# Patient Record
Sex: Male | Born: 1955 | Race: White | Hispanic: No | Marital: Single | Smoking: Never smoker
Health system: Southern US, Community
[De-identification: ages and names within clinical notes are randomized; demographics above are authoritative.]

## PROBLEM LIST (undated history)

## (undated) DIAGNOSIS — I4819 Other persistent atrial fibrillation: Secondary | ICD-10-CM

## (undated) DIAGNOSIS — N400 Enlarged prostate without lower urinary tract symptoms: Secondary | ICD-10-CM

## (undated) DIAGNOSIS — I483 Typical atrial flutter: Secondary | ICD-10-CM

## (undated) DIAGNOSIS — I493 Ventricular premature depolarization: Secondary | ICD-10-CM

## (undated) DIAGNOSIS — E079 Disorder of thyroid, unspecified: Secondary | ICD-10-CM

## (undated) DIAGNOSIS — M791 Myalgia, unspecified site: Secondary | ICD-10-CM

## (undated) DIAGNOSIS — I480 Paroxysmal atrial fibrillation: Secondary | ICD-10-CM

## (undated) DIAGNOSIS — E785 Hyperlipidemia, unspecified: Secondary | ICD-10-CM

## (undated) DIAGNOSIS — I4510 Unspecified right bundle-branch block: Secondary | ICD-10-CM

## (undated) DIAGNOSIS — Z87898 Personal history of other specified conditions: Secondary | ICD-10-CM

## (undated) HISTORY — DX: Other persistent atrial fibrillation: I48.19

## (undated) HISTORY — DX: Ventricular premature depolarization: I49.3

## (undated) HISTORY — PX: ORCHIECTOMY: SHX2116

## (undated) HISTORY — DX: Myalgia, unspecified site: M79.10

## (undated) HISTORY — DX: Hyperlipidemia, unspecified: E78.5

## (undated) HISTORY — DX: Benign prostatic hyperplasia without lower urinary tract symptoms: N40.0

## (undated) HISTORY — DX: Typical atrial flutter: I48.3

## (undated) HISTORY — DX: Unspecified right bundle-branch block: I45.10

## (undated) HISTORY — PX: TOOTH EXTRACTION: SUR596

## (undated) HISTORY — DX: Paroxysmal atrial fibrillation: I48.0

## (undated) HISTORY — DX: Personal history of other specified conditions: Z87.898

## (undated) HISTORY — PX: BACK SURGERY: SHX140

## (undated) HISTORY — DX: Disorder of thyroid, unspecified: E07.9

---

## 2002-11-14 ENCOUNTER — Encounter: Payer: Self-pay | Admitting: Family Medicine

## 2002-11-14 ENCOUNTER — Encounter: Admission: RE | Admit: 2002-11-14 | Discharge: 2002-11-14 | Payer: Self-pay | Admitting: Family Medicine

## 2006-05-11 ENCOUNTER — Ambulatory Visit: Payer: Self-pay | Admitting: Family Medicine

## 2006-05-16 ENCOUNTER — Encounter: Admission: RE | Admit: 2006-05-16 | Discharge: 2006-05-16 | Payer: Self-pay | Admitting: Family Medicine

## 2007-11-14 ENCOUNTER — Ambulatory Visit: Payer: Self-pay | Admitting: Family Medicine

## 2007-11-27 ENCOUNTER — Ambulatory Visit: Payer: Self-pay | Admitting: Family Medicine

## 2008-02-10 ENCOUNTER — Ambulatory Visit: Payer: Self-pay | Admitting: Family Medicine

## 2008-02-13 ENCOUNTER — Encounter: Payer: Self-pay | Admitting: Pulmonary Disease

## 2008-02-27 ENCOUNTER — Encounter: Payer: Self-pay | Admitting: Pulmonary Disease

## 2008-03-05 ENCOUNTER — Ambulatory Visit: Payer: Self-pay | Admitting: Family Medicine

## 2008-04-13 DIAGNOSIS — F329 Major depressive disorder, single episode, unspecified: Secondary | ICD-10-CM | POA: Insufficient documentation

## 2008-04-13 DIAGNOSIS — K219 Gastro-esophageal reflux disease without esophagitis: Secondary | ICD-10-CM | POA: Insufficient documentation

## 2008-04-13 DIAGNOSIS — R9431 Abnormal electrocardiogram [ECG] [EKG]: Secondary | ICD-10-CM

## 2008-04-13 DIAGNOSIS — I10 Essential (primary) hypertension: Secondary | ICD-10-CM

## 2008-04-13 DIAGNOSIS — R0609 Other forms of dyspnea: Secondary | ICD-10-CM

## 2008-04-13 DIAGNOSIS — E785 Hyperlipidemia, unspecified: Secondary | ICD-10-CM

## 2008-04-14 ENCOUNTER — Ambulatory Visit: Payer: Self-pay | Admitting: Pulmonary Disease

## 2008-04-14 DIAGNOSIS — J309 Allergic rhinitis, unspecified: Secondary | ICD-10-CM | POA: Insufficient documentation

## 2008-04-15 ENCOUNTER — Telehealth (INDEPENDENT_AMBULATORY_CARE_PROVIDER_SITE_OTHER): Payer: Self-pay | Admitting: *Deleted

## 2008-04-17 ENCOUNTER — Telehealth (INDEPENDENT_AMBULATORY_CARE_PROVIDER_SITE_OTHER): Payer: Self-pay | Admitting: *Deleted

## 2008-07-20 ENCOUNTER — Ambulatory Visit: Payer: Self-pay | Admitting: Gastroenterology

## 2008-07-28 ENCOUNTER — Encounter: Payer: Self-pay | Admitting: Gastroenterology

## 2008-07-28 ENCOUNTER — Ambulatory Visit: Payer: Self-pay | Admitting: Gastroenterology

## 2008-08-03 ENCOUNTER — Encounter: Payer: Self-pay | Admitting: Gastroenterology

## 2008-11-12 ENCOUNTER — Ambulatory Visit: Payer: Self-pay | Admitting: Family Medicine

## 2008-12-02 ENCOUNTER — Ambulatory Visit: Payer: Self-pay | Admitting: Family Medicine

## 2008-12-16 ENCOUNTER — Ambulatory Visit: Payer: Self-pay | Admitting: Family Medicine

## 2012-09-05 ENCOUNTER — Encounter: Payer: Self-pay | Admitting: Gastroenterology

## 2014-09-18 ENCOUNTER — Encounter: Payer: Self-pay | Admitting: Gastroenterology

## 2015-01-08 DIAGNOSIS — Z1159 Encounter for screening for other viral diseases: Secondary | ICD-10-CM | POA: Insufficient documentation

## 2015-01-08 DIAGNOSIS — Z1211 Encounter for screening for malignant neoplasm of colon: Secondary | ICD-10-CM | POA: Insufficient documentation

## 2015-01-08 DIAGNOSIS — R040 Epistaxis: Secondary | ICD-10-CM | POA: Insufficient documentation

## 2015-01-08 DIAGNOSIS — Z125 Encounter for screening for malignant neoplasm of prostate: Secondary | ICD-10-CM | POA: Insufficient documentation

## 2015-01-08 DIAGNOSIS — N400 Enlarged prostate without lower urinary tract symptoms: Secondary | ICD-10-CM | POA: Insufficient documentation

## 2015-01-08 DIAGNOSIS — I499 Cardiac arrhythmia, unspecified: Secondary | ICD-10-CM | POA: Insufficient documentation

## 2015-02-02 ENCOUNTER — Encounter: Payer: Self-pay | Admitting: Cardiology

## 2015-03-09 DIAGNOSIS — E782 Mixed hyperlipidemia: Secondary | ICD-10-CM | POA: Insufficient documentation

## 2015-03-09 DIAGNOSIS — M791 Myalgia, unspecified site: Secondary | ICD-10-CM | POA: Insufficient documentation

## 2015-03-09 DIAGNOSIS — E039 Hypothyroidism, unspecified: Secondary | ICD-10-CM | POA: Insufficient documentation

## 2015-03-11 ENCOUNTER — Encounter: Payer: Self-pay | Admitting: *Deleted

## 2015-03-11 ENCOUNTER — Encounter: Payer: Self-pay | Admitting: Cardiology

## 2015-03-11 ENCOUNTER — Ambulatory Visit (INDEPENDENT_AMBULATORY_CARE_PROVIDER_SITE_OTHER): Payer: BC Managed Care – PPO | Admitting: Cardiology

## 2015-03-11 VITALS — BP 170/100 | HR 61 | Ht 70.0 in | Wt 190.2 lb

## 2015-03-11 DIAGNOSIS — I10 Essential (primary) hypertension: Secondary | ICD-10-CM | POA: Diagnosis not present

## 2015-03-11 DIAGNOSIS — I491 Atrial premature depolarization: Secondary | ICD-10-CM | POA: Insufficient documentation

## 2015-03-11 DIAGNOSIS — I452 Bifascicular block: Secondary | ICD-10-CM

## 2015-03-11 DIAGNOSIS — I493 Ventricular premature depolarization: Secondary | ICD-10-CM | POA: Diagnosis not present

## 2015-03-11 DIAGNOSIS — R9431 Abnormal electrocardiogram [ECG] [EKG]: Secondary | ICD-10-CM

## 2015-03-11 NOTE — Patient Instructions (Signed)
Medication Instructions:  No changes  Labwork: none  Testing/Procedures: Your physician has requested that you have an echocardiogram. Echocardiography is a painless test that uses sound waves to create images of your heart. It provides your doctor with information about the size and shape of your heart and how well your heart's chambers and valves are working. This procedure takes approximately one hour. There are no restrictions for this procedure.  Your physician has requested that you have a lexiscan myoview. For further information please visit [email or web address]. Please follow instruction sheet, as given.  Follow-Up: 1 year with Dr Mayford Knifeurner

## 2015-03-11 NOTE — Progress Notes (Signed)
Cardiology Office Note   Date:  03/11/2015   ID:  Donald Morales, DOB 1956/11/16, MRN [id number]  PCP:  Sissy Hoff, MD    Chief Complaint  Patient presents with  . Shortness of Breath  . Irregular Heart Beat  . Abnormal ECG      History of Present Illness: Donald Morales is a 59 y.o. male who presents for evaluation of RBBB and PVC's.  He presented to PCP for routine PE and was noted to have an irregular heart beat and EKG showed PVC's and RBBB.  He denies any history of chest pain but has had some DOE when going up stairs.  This has been going on for a few years but is about the same and has not really changed much.  He was worried that he may have fibromyalgia due to lack of energy and diffuse muscular aches.  He used to work out a lot but has not recently and then got 2 new puppies and feels tired when walking around the block.    He does not get much aerobic exercise.  He has a history of dyslipidemia. He has had a history of palpitations in the past but it was in the setting of increased stress and no workup was pursued.  He denies any dizziness or syncope.  He thinks that occasionally he will have some LE edema that he does not recognize but he says that some of his friends have commented on.      Past Medical History  Diagnosis Date  . Thyroid disease   . Hyperlipidemia   . Irregular heart beat   . History of epistaxis   . BPH (benign prostatic hyperplasia)   . Muscle ache     Past Surgical History  Procedure Laterality Date  . Tooth extraction    . Back surgery    . Orchiectomy       Current Outpatient Prescriptions  Medication Sig Dispense Refill  . ibuprofen (ADVIL,MOTRIN) 200 MG tablet Take 200 mg by mouth every 6 (six) hours as needed.    Marland Kitchen levothyroxine (SYNTHROID, LEVOTHROID) 75 MCG tablet Take 75 mcg by mouth daily.    Marland Kitchen loratadine (CLARITIN) 10 MG tablet Take 10 mg by mouth daily.    Marland Kitchen omeprazole (PRILOSEC) 20 MG capsule 1 capsule     No current  facility-administered medications for this visit.    Allergies:   Aspirin and Codeine    Social History:  The patient  reports that he has never smoked. He does not have any smokeless tobacco history on file. He reports that he drinks alcohol. He reports that he does not use illicit drugs.   Family History:  The patient's family history includes Alcoholism in his father; Heart disease in his mother.    ROS:  Please see the history of present illness.   Otherwise, review of systems are positive for none.   All other systems are reviewed and negative.    PHYSICAL EXAM: VS:  BP 170/100 mmHg  Pulse 61  Ht  (1.778 m)  Wt 190 lb 3.2 oz (86.274 kg)  BMI 27.29 kg/m2 , BMI Body mass index is 27.29 kg/(m^2). GEN: Well nourished, well developed, in no acute distress HEENT: normal Neck: no JVD, carotid bruits, or masses Cardiac: RRR; no murmurs, rubs, or gallops,no edema  Respiratory:  clear to auscultation bilaterally, normal work of breathing GI: soft, nontender, nondistended, + BS MS: no deformity or atrophy Skin: warm and dry,  no rash Neuro:  Strength and sensation are intact Psych: euthymic mood, full affect   EKG:  EKG is ordered today. The ekg ordered today demonstrates NSR with atrial couplet, RBBB, left posterior fascicular block   Recent Labs: No results found for requested labs within last 365 days.    Lipid Panel No results found for: CHOL, TRIG, HDL, CHOLHDL, VLDL, LDLCALC, LDLDIRECT    Wt Readings from Last 3 Encounters:  03/11/15 190 lb 3.2 oz (86.274 kg)  04/14/08 191 lb (86.637 kg)        ASSESSMENT AND PLAN:  1.  Abnormal EKG with RBBB -  Lexiscan myoview to rule out ischemia and 2D echo to assess LVF 2.  PVC's and PAC's - I will  check Holter monitor to assess PVC load 3.  Exertional fatigue    Current medicines are reviewed at length with the patient today.  The patient does not have concerns regarding medicines.  The following changes have  been made:  no change  Labs/ tests ordered today include: see above assessment and plan  Orders Placed This Encounter  Procedures  . Myocardial Perfusion Imaging  . EKG 12-Lead  . 2D Echocardiogram with contrast     Disposition:   FU with me  in 1 year if studies are normal   Harlon FlorSigned, TURNER,TRACI R, MD  03/11/2015 2:50 PM    [facility] [address], [place], [place]  [address] Phone: [phone]; Fax: [phone]

## 2015-03-16 ENCOUNTER — Ambulatory Visit (HOSPITAL_COMMUNITY): Payer: BC Managed Care – PPO | Attending: Cardiology | Admitting: Cardiology

## 2015-03-16 DIAGNOSIS — I452 Bifascicular block: Secondary | ICD-10-CM | POA: Diagnosis not present

## 2015-03-16 DIAGNOSIS — I493 Ventricular premature depolarization: Secondary | ICD-10-CM | POA: Insufficient documentation

## 2015-03-16 DIAGNOSIS — R9431 Abnormal electrocardiogram [ECG] [EKG]: Secondary | ICD-10-CM | POA: Diagnosis not present

## 2015-03-16 DIAGNOSIS — I491 Atrial premature depolarization: Secondary | ICD-10-CM | POA: Diagnosis not present

## 2015-03-16 DIAGNOSIS — I10 Essential (primary) hypertension: Secondary | ICD-10-CM | POA: Insufficient documentation

## 2015-03-16 NOTE — Progress Notes (Signed)
Echo performed. 

## 2015-03-19 ENCOUNTER — Telehealth: Payer: Self-pay | Admitting: Cardiology

## 2015-03-19 NOTE — Telephone Encounter (Signed)
New message ° ° ° ° ° °Want echo results °

## 2015-03-19 NOTE — Telephone Encounter (Signed)
Informed patient of ECHO results and verbal understanding expressed.   

## 2015-03-19 NOTE — Telephone Encounter (Signed)
F/u ° ° °Pt returning your call °

## 2015-03-23 ENCOUNTER — Ambulatory Visit (HOSPITAL_COMMUNITY): Payer: BC Managed Care – PPO | Attending: Cardiology | Admitting: Radiology

## 2015-03-23 DIAGNOSIS — R9431 Abnormal electrocardiogram [ECG] [EKG]: Secondary | ICD-10-CM | POA: Insufficient documentation

## 2015-03-23 DIAGNOSIS — I452 Bifascicular block: Secondary | ICD-10-CM

## 2015-03-23 DIAGNOSIS — R0609 Other forms of dyspnea: Secondary | ICD-10-CM

## 2015-03-23 DIAGNOSIS — I491 Atrial premature depolarization: Secondary | ICD-10-CM

## 2015-03-23 DIAGNOSIS — I4510 Unspecified right bundle-branch block: Secondary | ICD-10-CM | POA: Diagnosis not present

## 2015-03-23 DIAGNOSIS — I493 Ventricular premature depolarization: Secondary | ICD-10-CM

## 2015-03-23 DIAGNOSIS — R5383 Other fatigue: Secondary | ICD-10-CM | POA: Insufficient documentation

## 2015-03-23 DIAGNOSIS — I10 Essential (primary) hypertension: Secondary | ICD-10-CM | POA: Insufficient documentation

## 2015-03-23 MED ORDER — TECHNETIUM TC 99M SESTAMIBI GENERIC - CARDIOLITE
30.0000 | Freq: Once | INTRAVENOUS | Status: AC | PRN
  Administered 2015-03-23: 30 via INTRAVENOUS

## 2015-03-23 MED ORDER — TECHNETIUM TC 99M SESTAMIBI GENERIC - CARDIOLITE
11.0000 | Freq: Once | INTRAVENOUS | Status: AC | PRN
  Administered 2015-03-23: 11 via INTRAVENOUS

## 2015-03-23 MED ORDER — REGADENOSON 0.4 MG/5ML IV SOLN
0.4000 mg | Freq: Once | INTRAVENOUS | Status: AC
  Administered 2015-03-23: 0.4 mg via INTRAVENOUS

## 2015-03-23 NOTE — Progress Notes (Signed)
[facility] SITE [facility] NUCLEAR MED [address] [place], [place] [address] [phone]    Cardiology Nuclear Med Study  Donald BornDonald R Taranto is a 59 y.o. male     MRN : [id number]     DOB: [birth year]  Procedure Date: 03/23/2015  Nuclear Med Background Indication for Stress Test:  Evaluation for Ischemia and Abnormal EKG History:  MPI 2012 (normal) EF 61% Cardiac Risk Factors: Hypertension and RBBB  Symptoms:  DOE and Fatigue  Nuclear Pre-Procedure Caffeine/Decaff Intake:  None> 12 hrs NPO After: 7:00pm   Lungs:  clear O2 Sat: 98% on room air. IV 0.9% NS with Angio Cath:  22g  IV Site: R Antecubital x 1, tolerated well IV Started by:  Irean HongPatsy Edwards, RN  Chest Size (in):  42 Cup Size: n/a  Height: 5\' 10"  (1.778 m)  Weight:  188 lb (85.276 kg)  BMI:  Body mass index is 26.98 kg/(m^2). Tech Comments:  Patient complained of dizziness after Cardiolite was injected. BP 118/80, HR 39 to 48, O2 sat 98%RA. Patient was sweaty and pale. Patient placed in trendelenburg position with resolution of symptoms.BP 120/80, HR 49 O2 sat 98%RA. EKG showed HR 48 to 51.Irean HongPatsy Edwards, RN.    Nuclear Med Study 1 or 2 day study: 1 day  Stress Test Type:  Lexiscan  Reading MD: N/A  Order Authorizing Provider:  Armanda Magicraci Turner, MD  Resting Radionuclide: Technetium 688m Sestamibi  Resting Radionuclide Dose: 11.0 mCi   Stress Radionuclide:  Technetium 508m Sestamibi  Stress Radionuclide Dose: 33.0 mCi           Stress Protocol Rest HR: 55 Stress HR: 75  Rest BP: 143/99 Stress BP: 135/100  Exercise Time (min): n/a METS: n/a   Predicted Max HR: 164 bpm % Max HR: 45.73 bpm Rate Pressure Product: [id number]   Dose of Adenosine (mg):  n/a Dose of Lexiscan: 0.4 mg  Dose of Atropine (mg): n/a Dose of Dobutamine: n/a mcg/kg/min (at max HR)  Stress Test Technologist: D[place]ota Kirlin ChimesSharon Brooks, BS-ES  Nuclear Technologist:  Kerby NoraElzbieta Kubak, CNMT     Rest Procedure:  Myocardial perfusion imaging was performed at rest 45  minutes following the intravenous administration of Technetium 368m Sestamibi. Rest ECG: NSR-RBBB, LPFB  Stress Procedure:  The patient received IV Lexiscan 0.4 mg over 15-seconds.  Technetium 178m Sestamibi injected at 30-seconds.  Quantitative spect images were obtained after a 45 minute delay.  During the infusion of Lexiscan the patient complained of SOB, nausea and hot flash.  These symptoms began to resolve in recovery.  Stress ECG: No significant change from baseline ECG  QPS Raw Data Images:  Normal; no motion artifact; normal heart/lung ratio. Stress Images:  Normal homogeneous uptake in all areas of the myocardium. Rest Images:  Normal homogeneous uptake in all areas of the myocardium. Subtraction (SDS):  No evidence of ischemia. Transient Ischemic Dilatation (Normal <1.22):  1.01 Lung/Heart Ratio (Normal <0.45):  0.28  Quantitative Gated Spect Images QGS EDV:  156 ml QGS ESV:  79 ml  Impression Exercise Capacity:  Lexiscan with no exercise. BP Response:  Normal blood pressure response. Clinical Symptoms:  There is dyspnea. ECG Impression:  No significant ST segment change suggestive of ischemia. Comparison with Prior Nuclear Study: No images to compare  Overall Impression:  Normal stress nuclear study.  LV Ejection Fraction: 49%.  LV Wall Motion:  NL LV Function; NL Wall Motion    Lars MassonELSON, Din Bookwalter H 03/23/2015

## 2016-02-01 IMAGING — NM NM MYOCAR MULTI W/ SPECT
3 series · 18 of 18 positions shown · non-contrast
Comparison: none

[Series 1: rest · 6.51mm/px · 6 of 64 frames shown]
[frame 6/64]
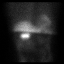
[frame 16/64]
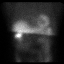
[frame 27/64]
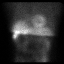
[frame 38/64]
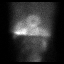
[frame 48/64]
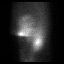
[frame 59/64]
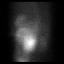

[Series 2: stress · 6.51mm/px · 6 of 64 frames shown (1 of 2)]
[frame 6/64]
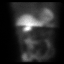
[frame 16/64]
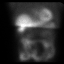
[frame 27/64]
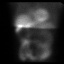
[frame 38/64]
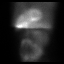
[frame 48/64]
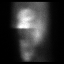
[frame 59/64]
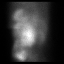

[Series 2: stress · 6.51mm/px · 6 of 512 frames shown (2 of 2)]
[frame 43/512]
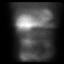
[frame 128/512]
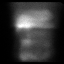
[frame 214/512]
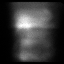
[frame 299/512]
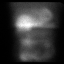
[frame 384/512]
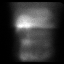
[frame 470/512]
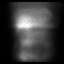

[18 of 18 positions shown; findings below may reference images not displayed]

Canned report from images found in remote index.

Refer to host system for actual result text.

## 2017-02-20 ENCOUNTER — Encounter: Payer: Self-pay | Admitting: Cardiology

## 2017-02-24 ENCOUNTER — Telehealth: Payer: Self-pay | Admitting: Physician Assistant

## 2017-02-24 NOTE — Telephone Encounter (Signed)
Received call to answering service from [employer] for a patient of Dr. York Spanielilley's for a critical notification report.  Per their record, the monitor is for diagnosis of typical atrial flutter. Today they showed first documentation of atrial fibrillation with a rate of 160. He pushed the monitor indicator for symptoms - felt a rapid HR - at home lying down when it happened, woke up from a nap. Follow-up recording showed more of an atrial flutter 130bpm with occasional PVCs per [employer]. I called patient - he just took his HR, currently 89 and feeling great. He states he was recently found to be in atrial flutter at his PCP but was back in NSR with HR at 60 by time of cardiology visit the following day with Dr. Donnie Ahoilley. I do not have access to those records.  He has f/u 03/05/17 with Dr. Donnie Ahoilley. CHADSVASC is only 1 per review of risk factors with patient. He says he was not started on any medication at OV, strategy was to collect info with labwork, CXR and event monitor. Reviewed with Dr. Elberta Fortisamnitz - since patient is feeling fine now, continue event monitoring for now and forward to Dr. Donnie Ahoilley for his review and further management. ER precautions reviewed. Pt grateful for call. Dayna Dunn PA-C

## 2017-02-26 ENCOUNTER — Other Ambulatory Visit: Payer: Self-pay | Admitting: Cardiology

## 2017-02-26 ENCOUNTER — Ambulatory Visit
Admission: RE | Admit: 2017-02-26 | Discharge: 2017-02-26 | Disposition: A | Discharge status: Home / Self Care | Payer: BC Managed Care – PPO | Source: Ambulatory Visit | Attending: Cardiology | Admitting: Cardiology

## 2017-02-26 DIAGNOSIS — I483 Typical atrial flutter: Secondary | ICD-10-CM

## 2017-03-09 ENCOUNTER — Other Ambulatory Visit: Payer: Self-pay | Admitting: Nurse Practitioner

## 2017-03-09 ENCOUNTER — Encounter: Payer: Self-pay | Admitting: Cardiology

## 2017-03-09 NOTE — Consult Note (Signed)
 Donald Morales, Donald Morales  Date of visit:  02/20/2017 DOB:  03/20/1956    Age:  61 yrs. Medical record number:  [id number]     Account number:  [id number] Primary Care Provider: SWAYNE, DAVID ____________________________ CURRENT DIAGNOSES  1. Atrial flutter, typical  2. Elevated blood-pressure reading, without diagnosis of hypertension  3. Hypothyroidism  4. Hyperlipidemia ____________________________ ALLERGIES  Aspirin, Epistaxis  Codeine, Hallucinations ____________________________ MEDICATIONS  1. omeprazole 20 mg capsule,delayed release, 1 p.o. daily  2. levothyroxine 75 mcg tablet, 1 p.o. daily  3. ibuprofen 200 mg tablet, PRN ____________________________ HISTORY OF PRESENT ILLNESS This 61-year-old male is seen at the request of Dr. Swayne for evaluation of atrial arrhythmia. The patient previously has been in good health except for elevation of blood pressure and mild hyperlipidemia. About 2 years ago he was seen by Dr. Tracy Turner for evaluation of PVCs and some mild dyspnea. At that time had a negative Lexiscan Cardiolite with an EF of 49% and no ischemia. An echocardiogram was also normal. He was feeling well and saw Dr. Swayne yesterday for routine visit was noted to be severely tachycardic. An EKG showed SVT with a rate of 163 but to me looks like atrial flutter with 2:1 response. The patient was asymptomatic at the time however. He was unaware that he had a rapid heart rate and Dr. Swayne's office arranged for him to be seen here today. The patient really doesn't have any complaints today and states that he feels well. He has not been exercising much. He denies angina and has no PND, orthopnea, syncope, or claudication. He is unaware that he has a regular heart beat than yesterday during his tachycardia felt fine. He has not had lab work done recently. He states that his blood pressure has been rising gradually over the past several years and has been elevated in the office today as well as a  Dr. Swayne's office. He states he checks his blood pressure at home occasionally. He states that friends have told him that his ankles will swell occasionally.  His only other complaint is of discomfort involving his feet and the back of his legs when he first gets up and starts to walk does not normally tender at rest. ____________________________ PAST HISTORY  Past Medical Illnesses:  hypothyroidism, hyperlipidemia, shingles, BPH, hypertension;  Cardiovascular Illnesses:  arrhythmia-PACs, arrhythmia-PVCs, conduction disorder-RBBB;  Infectious Diseases:  no previous history of significant infectious diseases;  Surgical Procedures:  lumbar laminectomy, left orchiectomy, foot surgery;  Trauma History:  no previous history of significant trauma;  NYHA Classification:  I;  Cardiology Procedures-Invasive:  no previous interventional or invasive cardiology procedures;  Cardiology Procedures-Noninvasive:  echocardiogram, treadmill cardiolite;  Peripheral Vascular Procedures:  no previous invasive peripheral vascular procedures.;  LVEF of 49% documented via nuclear study on 03/23/2015,   ____________________________ CARDIO-PULMONARY TEST DATES EKG Date:  02/20/2017;  Nuclear Study Date:  03/23/2015;  Echocardiography Date: 03/16/2015;   ____________________________ FAMILY HISTORY Brother -- Brother alive and well Brother -- Brother alive and well Father -- Father dead, Alcoholism Mother -- Mother dead, Heart disease, Cancer Sister -- Sister alive and well Sister -- Sister alive and well Sister -- Sister alive and well ____________________________ SOCIAL HISTORY Alcohol Use:  occasionally;  Smoking:  never smoked;  Diet:  regular diet;  Lifestyle:  single and no children;  Exercise:  no regular exercise;  Occupation:  custodian/maintenance;  Job Description:  [employer];   ____________________________ REVIEW OF SYSTEMS General:  denies recent weight change,   fatique or change in exercise  tolerance.  Integumentary:dry skin Eyes: wears eye glasses/contact lenses, denies diplopia, glaucoma or visual field defects. Ears, Nose, Throat, Mouth:  occasional epistaxis Respiratory: denies dyspnea, cough, wheezing or hemoptysis. Cardiovascular:  please review HPI Abdominal: denies dyspepsia, GI bleeding, constipation, or diarrhea Genitourinary-Male: no dysuria, urgency, frequency, or nocturia  Musculoskeletal:  leg cramps Neurological:  denies headaches, stroke, or TIA Psychiatric:  anxiety Hematological/Immunologic:  denies any food allergies, bleeding disorders. ____________________________ PHYSICAL EXAMINATION VITAL SIGNS  Blood Pressure:  154/90 Sitting, Right arm, regular cuff  , 150/100 Standing, Right arm and regular cuff   Pulse:  60/min. Weight:  189.00 lbs. Height:  68"BMI: 29  Constitutional:  pleasant white male in no acute distress, mildly obese Skin:  warm and dry to touch, no apparent skin lesions, or masses noted. Head:  normocephalic, normal hair pattern, no masses or tenderness Eyes:  EOMS Intact, PERRLA, C and S clear, Funduscopic exam not done. ENT:  ears, nose and throat reveal no gross abnormalities.  Dentition good. Neck:  supple, without massess. No JVD, thyromegaly or carotid bruits. Carotid upstroke normal. Chest:  normal symmetry, clear to auscultation. Cardiac:  regular rhythm, normal S1 and S2, No S3 or S4, no murmurs, gallops or rubs detected. Abdomen:  abdomen soft,non-tender, no masses, no hepatospenomegaly, or aneurysm noted Peripheral Pulses:  the femoral,dorsalis pedis, and posterior tibial pulses are full and equal bilaterally with no bruits auscultated. Extremities & Back:  no deformities, clubbing, cyanosis, erythema or edema observed. Normal muscle strength and tone. Neurological:  no gross motor or sensory deficits noted, affect appropriate, oriented x3. ____________________________ MOST RECENT LIPID PANEL 02/17/16  CHOL TOTL 147 mg/dl, LDL 86 NM,  HDL 44 mg/dl, TRIGLYCER 84 mg/dl, ALT 22 u/l, ALK PHOS 101 u/l, CHOL/HDL 3.3 (Calc) and AST 27 u/l ____________________________ IMPRESSIONS/PLAN  1. Asymptomatic atrial flutter which has resolved and is in normal sinus rhythm today with PVCs and PACs 2. 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 Elevation of blood pressure without prior diagnosis of hypertension 4. Hyperlipidemia 5. Hypothyroidism  Recommendations:  I have recommended that he wear a cardiac event monitor to determine how much in the way of atrial arrhythmias he is having. If he is having silent atrial flutter he might be a candidate for atrial flutter ablation. I also would recommend another echocardiogram to assess for structural heart disease particularly in light of his tachycardia. He should have lab work and have recommended this be done at Dr. Swayne's office soon. I would like him to have a chest x-ray. ____________________________ TODAYS ORDERS  1. [place]: Today  2. Return Visit: 1 month  3. 2D, color flow, doppler: At [facility]  4. Chest X-ray PA/Lat: today  5. 12 Lead EKG: Today                       ____________________________ Cardiology Physician:  W. Spencer Tilley, Jr. MD [facility]      

## 2017-03-09 NOTE — Progress Notes (Signed)
Donald Morales  Date of visit:  03/09/2017 DOB:  Apr 26, 1956    Age:  61 yrs. Medical record number:  [id number]     Account number:  [id number] Primary Care Provider: Moreen Fowler, DAVID ____________________________ CURRENT DIAGNOSES  1. Atrial flutter, typical  2. Elevated blood-pressure reading, without diagnosis of hypertension  3. Hypothyroidism  4. Hyperlipidemia  5. PVC's ____________________________ ALLERGIES  Aspirin, Epistaxis  Codeine, Hallucinations ____________________________ MEDICATIONS  1. ibuprofen 200 mg tablet, PRN  2. levothyroxine 75 mcg tablet, 1 p.o. daily  3. metoprolol succinate ER 50 mg tablet,extended release 24 hr, 1 p.o. daily  4. omeprazole 20 mg capsule,delayed release, 1 p.o. daily ____________________________ HISTORY OF PRESENT ILLNESS Patient returns for cardiac followup. His echocardiogram shows mild concentric LVH with a normal left atrial size. and he has some very mild hypertension. He has been taking beta blockers and has been symptomatic with palpitations usually corresponded to PVCs. His event monitor has shown atrial flutter and fibrillation on March 24, and March 26 and March 27 sometimes with rapid response. His initial presentation was atrial flutter with rapid response only physical. He also has known bifascicular block. His CHA2DS2VASC score is one. ____________________________ PAST HISTORY  Past Medical Illnesses:  hypothyroidism, hyperlipidemia, shingles, BPH, hypertension;  Cardiovascular Illnesses:  arrhythmia-PACs, arrhythmia-PVCs, conduction disorder-RBBB;  Infectious Diseases:  no previous history of significant infectious diseases;  Surgical Procedures:  lumbar laminectomy, left orchiectomy, foot surgery;  Trauma History:  no previous history of significant trauma;  NYHA Classification:  I;  Cardiology Procedures-Invasive:  no previous interventional or invasive cardiology procedures;  Cardiology Procedures-Noninvasive:  echocardiogram,  treadmill cardiolite, echocardiogram April 2018;  Peripheral Vascular Procedures:  no previous invasive peripheral vascular procedures.;  LVEF of 49% documented via nuclear study on 03/23/2015,   ____________________________ CARDIO-PULMONARY TEST DATES EKG Date:  02/20/2017;  Nuclear Study Date:  03/23/2015;  Echocardiography Date: 03/09/2017;   ____________________________ SOCIAL HISTORY Alcohol Use:  occasionally;  Smoking:  never smoked;  Diet:  regular diet;  Lifestyle:  single and no children;  Exercise:  no regular exercise;  Occupation:  Art therapist;  Job Description:  [employer];   ____________________________ REVIEW OF SYSTEMS General:  denies recent weight change, fatique or change in exercise tolerance. Respiratory: denies dyspnea, cough, wheezing or hemoptysis. Cardiovascular:  please review HPI  Genitourinary-Male: no dysuria, urgency, frequency, or nocturia  Musculoskeletal:  leg cramps Neurological:  denies headaches, stroke, or TIA Psychiatric:  anxiety  ____________________________ PHYSICAL EXAMINATION VITAL SIGNS  Blood Pressure:  160/100 Sitting, Right arm, regular cuff  , 150/90 Standing, Right arm and regular cuffBMI: 0  Constitutional:  pleasant white male in no acute distress, mildly obese Skin:  warm and dry to touch, no apparent skin lesions, or masses noted. Head:  normocephalic, normal hair pattern, no masses or tenderness Chest:  normal symmetry, clear to auscultation. Cardiac:  regular rhythm, normal S1 and S2, No S3 or S4, no murmurs, gallops or rubs detected. Peripheral Pulses:  the femoral,dorsalis pedis, and posterior tibial pulses are full and equal bilaterally with no bruits auscultated. Extremities & Back:  no deformities, clubbing, cyanosis, erythema or edema observed. Normal muscle strength and tone. Neurological:  no gross motor or sensory deficits noted, affect appropriate, oriented x3. ____________________________ MOST RECENT  LIPID PANEL 03/05/17  CHOL TOTL 155 mg/dl, LDL 89 NM, HDL 49 mg/dl, TRIGLYCER 85 mg/dl, ALT 24 u/l, ALK PHOS 104 u/l, CHOL/HDL 3.2 (Calc) and AST 27 u/l ____________________________ IMPRESSIONS/PLAN  1. Recurrent paroxysmal atrial flutter and atrial  fibrillation 2. Bifascicular block 3. Essential hypertension 4. PVCs  Recommendations:  The patient has paroxysmal atrial flutter as well as fibrillation. His initial presentation was asymptomatic at the time of a physical. He has had continued episodes. We talked about stroke risk as well as anticoagulation. With his CHA2DS2VASC score of one we discussed options of no treatment versus systemic anticoagulation. At the present time he did not opt for systemic anticoagulation for further stroke risk reduction.  I have recommended that he have a consultation with electrophysiologist at his age since he continues to have paroxysmal atrial fibrillation and atrial flutter to talk about possible consideration for ablation. He also has premature ventricular contractions. I will see him following the consultation with the electrophysiologist.  ____________________________ Cardiology Physician:  Kerry Hough MD [facility]

## 2017-03-27 ENCOUNTER — Encounter: Payer: Self-pay | Admitting: Internal Medicine

## 2017-03-27 ENCOUNTER — Ambulatory Visit (INDEPENDENT_AMBULATORY_CARE_PROVIDER_SITE_OTHER): Payer: BC Managed Care – PPO | Admitting: Internal Medicine

## 2017-03-27 VITALS — BP 130/82 | HR 78 | Ht 70.0 in | Wt 192.2 lb

## 2017-03-27 DIAGNOSIS — I480 Paroxysmal atrial fibrillation: Secondary | ICD-10-CM | POA: Diagnosis not present

## 2017-03-27 DIAGNOSIS — I493 Ventricular premature depolarization: Secondary | ICD-10-CM

## 2017-03-27 DIAGNOSIS — I483 Typical atrial flutter: Secondary | ICD-10-CM

## 2017-03-27 NOTE — Patient Instructions (Signed)
Medication Instructions:  Your physician recommends that you continue on your current medications as directed. Please refer to the Current Medication list given to you today.   Labwork: None ordered   Testing/Procedures: None ordered   Follow-Up: Your physician recommends that you schedule a follow-up appointment in: 2 months with Dr Allred   Any Other Special Instructions Will Be Listed Below (If Applicable).     If you need a refill on your cardiac medications before your next appointment, please call your pharmacy.   

## 2017-03-27 NOTE — Progress Notes (Signed)
Electrophysiology Office Note   Date:  03/27/2017   ID:  Donald Morales, DOB 20-Mar-1956, MRN [id number]  PCP:  Sissy Hoff, MD  Cardiologist:  Dr Donnie Aho Primary Electrophysiologist: Hillis Range, MD    CC:  Atrial fibrillation   History of Present Illness: Donald Morales is a 61 y.o. male who presents today for electrophysiology evaluation.   The patient is referred by Dr Donnie Aho for EP consultation regarding atrial fibrillation and atrial flutter.  The patient recently presented to Dr Merita Norton office for routine exam and was noted to have typical atrial flutter.  He was referred to Dr Donnie Aho and had event monitor placed which documented afib as well. He was also noted to have frequent ventricular ectopy.  He has been asymptomatic.  He was started on toprol for rate control.  He has done well since that time.  Today, he denies symptoms of palpitations, chest pain, shortness of breath, orthopnea, PND, lower extremity edema, claudication, dizziness, presyncope, syncope, bleeding, or neurologic sequela. The patient is tolerating medications without difficulties and is otherwise without complaint today.    Past Medical History:  Diagnosis Date  . BPH (benign prostatic hyperplasia)   . History of epistaxis   . Hyperlipidemia   . Muscle ache   . Paroxysmal atrial fibrillation (HCC)   . Premature ventricular contraction   . RBBB   . Thyroid disease   . Typical atrial flutter [facility])    Past Surgical History:  Procedure Laterality Date  . BACK SURGERY    . ORCHIECTOMY    . TOOTH EXTRACTION       Current Outpatient Prescriptions  Medication Sig Dispense Refill  . ibuprofen (ADVIL,MOTRIN) 200 MG tablet Take 200 mg by mouth every 6 (six) hours as needed (pain).     Marland Kitchen levothyroxine (SYNTHROID, LEVOTHROID) 75 MCG tablet Take 75 mcg by mouth daily.    Marland Kitchen loratadine (CLARITIN) 10 MG tablet Take 10 mg by mouth daily as needed for allergies.     . metoprolol succinate (TOPROL-XL) 50 MG  24 hr tablet Take 50 mg by mouth daily.    Marland Kitchen omeprazole (PRILOSEC) 20 MG capsule Take 20 mg by mouth daily.      No current facility-administered medications for this visit.     Allergies:   Aspirin and Codeine   Social History:  The patient  reports that he has never smoked. He has never used smokeless tobacco. He reports that he drinks alcohol. He reports that he does not use drugs.   Family History:  The patient's  family history includes Alcoholism in his father; Heart disease in his mother.    ROS:  Please see the history of present illness.   All other systems are personally reviewed and negative.    PHYSICAL EXAM: VS:  BP 130/82   Pulse 78   Ht  (1.778 m)   Wt 192 lb 3.2 oz (87.2 kg)   SpO2 97%   BMI 27.58 kg/m  , BMI Body mass index is 27.58 kg/m. GEN: Well nourished, well developed, in no acute distress  HEENT: normal  Neck: no JVD, carotid bruits, or masses Cardiac: iRRR; no murmurs, rubs, or gallops,no edema  Respiratory:  clear to auscultation bilaterally, normal work of breathing GI: soft, nontender, nondistended, + BS MS: no deformity or atrophy  Skin: warm and dry  Neuro:  Strength and sensation are intact Psych: euthymic mood, full affect  EKG:  EKG is ordered today. The ekg ordered today is  personally reviewed and shows afib, V rate 106 bpm, RBBB,   Recent Labs: No results found for requested labs within last 8760 hours.  personally reviewed   Lipid Panel  No results found for: CHOL, TRIG, HDL, CHOLHDL, VLDL, LDLCALC, LDLDIRECT personally reviewed   Wt Readings from Last 3 Encounters:  03/27/17 192 lb 3.2 oz (87.2 kg)  03/23/15 188 lb (85.3 kg)  03/11/15 190 lb 3.2 oz (86.3 kg)      Other studies personally reviewed: Additional studies/ records that were reviewed today include: EKG from Dr Merita Norton office which confirms typical atrial flutter,  Echo from Dr Tawana Scale office which reveals preserved EF, no valvular disease, mild LA  enlargement, event monitor which shows afib as well as sinus with ventricular ectopy  Review of the above records today demonstrates: as above   ASSESSMENT AND PLAN:  1.  Typical atrial flutter/ paroxysmal atrial fibrillation chads2vasc score is 1.  We discussed pros and cons of anticoagulation.  As per guidelines, he would prefer to not start anticoagulation at this time.  Therapeutic strategies for afib including rate control and rhythm control were discussed in detail with the patient today. He is aware that at this point, ablation is supported only for symptomatic relief.  He does not feel that his symptoms warrant the risks of the procedure.  CABANA trial results are expected to be presented in May which may show benefit outside of symptomatic relief.  Current data available would not support ablation for him. He is clear that he is not interested in AAD therapy at this time. No changes are therefore made today.  2. PVCs Normal EF Continue beta blocker  3. Elevated TSH Followed by Dr Azucena Cecil  Return to see me in 6 weeks to reassess for symptoms related to afib Follow-up with Dr Donnie Aho as scheduled  Current medicines are reviewed at length with the patient today.   The patient does not have concerns regarding his medicines.  The following changes were made today:  none    Signed, Hillis Range, MD  03/27/2017 4:28 PM     [facility] [address] [place] [address] [phone] (office) [phone] (fax)

## 2017-04-09 ENCOUNTER — Telehealth: Payer: Self-pay | Admitting: Internal Medicine

## 2017-04-09 NOTE — Telephone Encounter (Signed)
New message     1. What dental office are you calling from? [facility]   2. What is your office phone and fax number? [phone] fax [phone]  3. What type of procedure is the patient having performed? Cleaning and procedure   4. What date is procedure scheduled? 04/12/17   5. What is your question (ex. Antibiotics prior to procedure, holding medication-we need to know how long dentist wants pt to hold med)? Does pt need premed and , does he need to hold any medication ?

## 2017-04-09 NOTE — Telephone Encounter (Signed)
Returned call to Delaney Meigsamara at [facility]-referred to Dr. Donnie Ahoilley for clearance.  Verbalized understanding.

## 2017-06-04 ENCOUNTER — Ambulatory Visit: Payer: BC Managed Care – PPO | Admitting: Internal Medicine

## 2018-10-02 ENCOUNTER — Telehealth: Payer: Self-pay | Admitting: Cardiology

## 2018-10-07 NOTE — Telephone Encounter (Signed)
done

## 2018-10-30 ENCOUNTER — Encounter: Payer: Self-pay | Admitting: Cardiology

## 2018-10-30 ENCOUNTER — Ambulatory Visit: Payer: BC Managed Care – PPO | Admitting: Cardiology

## 2018-10-30 VITALS — BP 132/80 | HR 71 | Ht 70.0 in | Wt 190.8 lb

## 2018-10-30 DIAGNOSIS — I493 Ventricular premature depolarization: Secondary | ICD-10-CM

## 2018-10-30 DIAGNOSIS — R0609 Other forms of dyspnea: Secondary | ICD-10-CM | POA: Diagnosis not present

## 2018-10-30 DIAGNOSIS — E785 Hyperlipidemia, unspecified: Secondary | ICD-10-CM

## 2018-10-30 DIAGNOSIS — I4819 Other persistent atrial fibrillation: Secondary | ICD-10-CM | POA: Diagnosis not present

## 2018-10-30 DIAGNOSIS — I452 Bifascicular block: Secondary | ICD-10-CM

## 2018-10-30 DIAGNOSIS — I480 Paroxysmal atrial fibrillation: Secondary | ICD-10-CM

## 2018-10-30 MED ORDER — METOPROLOL SUCCINATE ER 25 MG PO TB24: 75.0000 mg | ORAL_TABLET | Freq: Every day | ORAL | 1 refills | Status: DC

## 2018-10-30 NOTE — Progress Notes (Signed)
Cardiology Office Note:    Date:  10/30/2018   ID:  Donald Morales, DOB Oct 31, 1956, MRN [id number]  PCP:  Tally Joe, MD  Cardiologist:  Gypsy Balsam, MD    Referring MD: Tally Joe, MD   Chief Complaint  Patient presents with  . Follow-up  I would like to be checked out  History of Present Illness:    Donald Morales is a 62 y.o. male with history of paroxysmal atrial fibrillation as well as one episode of atrial flutter initial diagnosis was established more than year ago after that he was referred to EP team for consideration of antiarrhythmic therapy or ablation.  Decision has been made that no ablation was indicated at that time he also did not want to use any activity medication and then he did not see anybody for almost a year now he would like to be seen because there are multiple coworkers at his job in the pounding heart attack and he is concerned about she reports to have shortness of breath with exertion this is something new that gradually developed over the 6 months time when he walks upstairs he had to slow down and stop because of shortness of breath.  There is no chest pain tightness squeezing pressure burning chest.  There is no dizziness no syncope no palpitations.  Past Medical History:  Diagnosis Date  . BPH (benign prostatic hyperplasia)   . History of epistaxis   . Hyperlipidemia   . Muscle ache   . Paroxysmal atrial fibrillation (HCC)   . Premature ventricular contraction   . RBBB   . Thyroid disease   . Typical atrial flutter [facility])     Past Surgical History:  Procedure Laterality Date  . BACK SURGERY    . ORCHIECTOMY    . TOOTH EXTRACTION      Current Medications: Current Meds  Medication Sig  . amLODipine (NORVASC) 2.5 MG tablet Take 2.5 mg by mouth daily.  Marland Kitchen ibuprofen (ADVIL,MOTRIN) 200 MG tablet Take 200 mg by mouth every 6 (six) hours as needed (pain).   Marland Kitchen levothyroxine (SYNTHROID, LEVOTHROID) 100 MCG tablet Take 100 mcg by mouth  daily.   . metoprolol succinate (TOPROL-XL) 25 MG 24 hr tablet Take 3 tablets (75 mg total) by mouth daily.  Marland Kitchen omeprazole (PRILOSEC) 20 MG capsule Take 20 mg by mouth daily.   . [DISCONTINUED] metoprolol succinate (TOPROL-XL) 50 MG 24 hr tablet Take 75 mg by mouth daily.     Allergies:   Aspirin and Codeine   Social History   Socioeconomic History  . Marital status: Single    Spouse name: Not on file  . Number of children: 0  . Years of education: college  . Highest education level: Not on file  Occupational History  . Occupation: [employer]  Social Needs  . Financial resource strain: Not on file  . Food insecurity:    Worry: Not on file    Inability: Not on file  . Transportation needs:    Medical: Not on file    Non-medical: Not on file  Tobacco Use  . Smoking status: Never Smoker  . Smokeless tobacco: Never Used  Substance and Sexual Activity  . Alcohol use: Yes    Alcohol/week: 0.0 standard drinks    Comment: rare  . Drug use: No  . Sexual activity: Not on file  Lifestyle  . Physical activity:    Days per week: Not on file    Minutes per session: Not  on file  . Stress: Not on file  Relationships  . Social connections:    Talks on phone: Not on file    Gets together: Not on file    Attends religious service: Not on file    Active member of club or organization: Not on file    Attends meetings of clubs or organizations: Not on file    Relationship status: Not on file  Other Topics Concern  . Not on file  Social History Narrative   [place] alone in [place]   Works in custodial department at [employer]     Family History: The patient's family history includes Alcoholism in his father; Heart disease in his mother. ROS:   Please see the history of present illness.    All 14 point review of systems negative except as described per history of present illness  EKGs/Labs/Other Studies Reviewed:      Recent Labs: No results found for  requested labs within last 8760 hours.  Recent Lipid Panel No results found for: CHOL, TRIG, HDL, CHOLHDL, VLDL, LDLCALC, LDLDIRECT  Physical Exam:    VS:  BP 132/80   Pulse 71   Ht 5\' 10"  (1.778 m)   Wt 190 lb 12.8 oz (86.5 kg)   SpO2 98%   BMI 27.38 kg/m     Wt Readings from Last 3 Encounters:  10/30/18 190 lb 12.8 oz (86.5 kg)  03/27/17 192 lb 3.2 oz (87.2 kg)  03/23/15 188 lb (85.3 kg)     GEN:  Well nourished, well developed in no acute distress HEENT: Normal NECK: No JVD; No carotid bruits LYMPHATICS: No lymphadenopathy CARDIAC: Irreg irreg, tachy, no murmurs, no rubs, no gallops RESPIRATORY:  Clear to auscultation without rales, wheezing or rhonchi  ABDOMEN: Soft, non-tender, non-distended MUSCULOSKELETAL:  No edema; No deformity  SKIN: Warm and dry LOWER EXTREMITIES: no swelling NEUROLOGIC:  Alert and oriented x 3 PSYCHIATRIC:  Normal affect   ASSESSMENT:    1. 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 Paroxysmal atrial fibrillation (HCC)   3. Persistent atrial fibrillation   4. Dyspnea on exertion   5. Dyslipidemia   6. PVC's (premature ventricular contractions)    PLAN:    In order of problems listed above:  1. Paroxysmal atrial fibrillation most likely persistent but now his EKG today showed atrial fibrillation right bundle branch block with fast ventricular rate I suspect his symptomatology is related to atrial fibrillation especially considering the fact that is poorly controlled we had a long discussion about what to doing the situation.  I will increase the dose of Toprol-XL from 50-75.  His chads 2 Vascor is only 1 and he prefers not to be anticoagulated.  I told him if he will consider cardioversion we need to anticoagulate him before it.  However I think the key will be to get echocardiogram to check left ventricular ejection fraction and look at the left atrial size by doing this we have some idea about how much atrial  fibrillation he truly gets on top of that because of his symptomatology I think we should revisit the issue of atrial fibrillation ablation again.  I brought concerned about using antiarrhythmics he said he will think it over.  As a part of evaluation we will also do stress test that will also help us make a decision regarding antiarrhythmic therapy. 2. Dyspnea on exertion could be related to atrial fibrillation which is poorly controlled we will get  echocardiogram to make sure he does not have tachycardia related cardiomyopathy stress test also will be done to rule out ischemia as potential cause of his symptoms 3. Dyslipidemia we will check fasting lipid profile. 4. PVCs now he is in atrial fibrillation.    Medication Adjustments/Labs and Tests Ordered: Current medicines are reviewed at length with the patient today.  Concerns regarding medicines are outlined above.  Orders Placed This Encounter  Procedures  . MYOCARDIAL PERFUSION IMAGING  . EKG 12-Lead  . ECHOCARDIOGRAM COMPLETE   Medication changes:  Meds ordered this encounter  Medications  . metoprolol succinate (TOPROL-XL) 25 MG 24 hr tablet    Sig: Take 3 tablets (75 mg total) by mouth daily.    Dispense:  180 tablet    Refill:  1    Signed, Georgeanna Lea, MD, [facility] 10/30/2018 12:19 PM    [facility]

## 2018-10-30 NOTE — Patient Instructions (Signed)
Medication Instructions:  Your physician has recommended you make the following change in your medication:   INCREASE: Metopolol succinate to 75 mg daily.  If you need a refill on your cardiac medications before your next appointment, please call your pharmacy.   Lab work: None.  If you have labs (blood work) drawn today and your tests are completely normal, you will receive your results only by: Marland Kitchen. MyChart Message (if you have MyChart) OR . A paper copy in the mail If you have any lab test that is abnormal or we need to change your treatment, we will call you to review the results.  Testing/Procedures: Your physician has requested that you have an echocardiogram. Echocardiography is a painless test that uses sound waves to create images of your heart. It provides your doctor with information about the size and shape of your heart and how well your heart's chambers and valves are working. This procedure takes approximately one hour. There are no restrictions for this procedure.  Your physician has requested that you have en exercise stress myoview. For further information please visit [email or web address]. Please follow instruction sheet, as given.    Follow-Up: At [facility], you and your health needs are our priority.  As part of our continuing mission to provide you with exceptional heart care, we have created designated Provider Care Teams.  These Care Teams include your primary Cardiologist (physician) and Advanced Practice Providers (APPs -  Physician Assistants and Nurse Practitioners) who all work together to provide you with the care you need, when you need it. You will need a follow up appointment in 3 weeks.  Please call our office 2 months in advance to schedule this appointment.  You may see No primary care provider on file. or another member of our [facility] Provider Team in [place]: Norman HerrlichBrian Munley, MD . Belva Cromeajan Revankar, MD  Any Other Special Instructions Will Be  Listed Below (If Applicable).  Echocardiogram An echocardiogram, or echocardiography, uses sound waves (ultrasound) to produce an image of your heart. The echocardiogram is simple, painless, obtained within a short period of time, and offers valuable information to your health care provider. The images from an echocardiogram can provide information such as:  Evidence of coronary artery disease (CAD).  Heart size.  Heart muscle function.  Heart valve function.  Aneurysm detection.  Evidence of a past heart attack.  Fluid buildup around the heart.  Heart muscle thickening.  Assess heart valve function.  Tell a health care provider about:  Any allergies you have.  All medicines you are taking, including vitamins, herbs, eye drops, creams, and over-the-counter medicines.  Any problems you or family members have had with anesthetic medicines.  Any blood disorders you have.  Any surgeries you have had.  Any medical conditions you have.  Whether you are pregnant or may be pregnant. What happens before the procedure? No special preparation is needed. Eat and drink normally. What happens during the procedure?  In order to produce an image of your heart, gel will be applied to your chest and a wand-like tool (transducer) will be moved over your chest. The gel will help transmit the sound waves from the transducer. The sound waves will harmlessly bounce off your heart to allow the heart images to be captured in real-time motion. These images will then be recorded.  You may need an IV to receive a medicine that improves the quality of the pictures. What happens after the procedure? You may return to your  normal schedule including diet, activities, and medicines, unless your health care provider tells you otherwise. This information is not intended to replace advice given to you by your health care provider. Make sure you discuss any questions you have with your health care  provider. Document Released: 11/17/2000 Document Revised: 07/08/2016 Document Reviewed: 07/28/2013 Elsevier Interactive Patient Education  2017 Elsevier Inc.  Cardiac Nuclear Scan A cardiac nuclear scan is a test that measures blood flow to the heart when a person is resting and when he or she is exercising. The test looks for problems such as:  Not enough blood reaching a portion of the heart.  The heart muscle not working normally.  You may need this test if:  You have heart disease.  You have had abnormal lab results.  You have had heart surgery or angioplasty.  You have chest pain.  You have shortness of breath.  In this test, a radioactive dye (tracer) is injected into your bloodstream. After the tracer has traveled to your heart, an imaging device is used to measure how much of the tracer is absorbed by or distributed to various areas of your heart. This procedure is usually done at a hospital and takes 2-4 hours. Tell a health care provider about:  Any allergies you have.  All medicines you are taking, including vitamins, herbs, eye drops, creams, and over-the-counter medicines.  Any problems you or family members have had with the use of anesthetic medicines.  Any blood disorders you have.  Any surgeries you have had.  Any medical conditions you have.  Whether you are pregnant or may be pregnant. What are the risks? Generally, this is a safe procedure. However, problems may occur, including:  Serious chest pain and heart attack. This is only a risk if the stress portion of the test is done.  Rapid heartbeat.  Sensation of warmth in your chest. This usually passes quickly.  What happens before the procedure?  Ask your health care provider about changing or stopping your regular medicines. This is especially important if you are taking diabetes medicines or blood thinners.  Remove your jewelry on the day of the procedure. What happens during the  procedure?  An IV tube will be inserted into one of your veins.  Your health care provider will inject a small amount of radioactive tracer through the tube.  You will wait for 20-40 minutes while the tracer travels through your bloodstream.  Your heart activity will be monitored with an electrocardiogram (ECG).  You will lie down on an exam table.  Images of your heart will be taken for about 15-20 minutes.  You may be asked to exercise on a treadmill or stationary bike. While you exercise, your heart's activity will be monitored with an ECG, and your blood pressure will be checked. If you are unable to exercise, you may be given a medicine to increase blood flow to parts of your heart.  When blood flow to your heart has peaked, a tracer will again be injected through the IV tube.  After 20-40 minutes, you will get back on the exam table and have more images taken of your heart.  When the procedure is over, your IV tube will be removed. The procedure may vary among health care providers and hospitals. Depending on the type of tracer used, scans may need to be repeated 3-4 hours later. What happens after the procedure?  Unless your health care provider tells you otherwise, you may return to your normal  schedule, including diet, activities, and medicines.  Unless your health care provider tells you otherwise, you may increase your fluid intake. This will help flush the contrast dye from your body. Drink enough fluid to keep your urine clear or pale yellow.  It is up to you to get your test results. Ask your health care provider, or the department that is doing the test, when your results will be ready. Summary  A cardiac nuclear scan measures the blood flow to the heart when a person is resting and when he or she is exercising.  You may need this test if you are at risk for heart disease.  Tell your health care provider if you are pregnant.  Unless your health care provider tells  you otherwise, increase your fluid intake. This will help flush the contrast dye from your body. Drink enough fluid to keep your urine clear or pale yellow. This information is not intended to replace advice given to you by your health care provider. Make sure you discuss any questions you have with your health care provider. Document Released: 12/15/2004 Document Revised: 11/22/2016 Document Reviewed: 10/29/2013 Elsevier Interactive Patient Education  2017 [employer].

## 2018-11-04 ENCOUNTER — Telehealth: Payer: Self-pay | Admitting: Cardiology

## 2018-11-04 NOTE — Telephone Encounter (Signed)
° ° °  1. Which medications need to be refilled? (please list name of each medication and dose if known) amlodipine besylate 2.5 mg 1QD  2. Which pharmacy/location (including street and city if local pharmacy) is medication to be sent to?[facility] [address] [place]  3. Do they need a 30 day or 90 day supply? 30

## 2018-11-21 ENCOUNTER — Encounter: Payer: Self-pay | Admitting: Internal Medicine

## 2018-11-29 ENCOUNTER — Ambulatory Visit (HOSPITAL_BASED_OUTPATIENT_CLINIC_OR_DEPARTMENT_OTHER): Payer: BC Managed Care – PPO

## 2019-01-14 ENCOUNTER — Ambulatory Visit (HOSPITAL_COMMUNITY)
Admission: RE | Admit: 2019-01-14 | Discharge: 2019-01-14 | Disposition: A | Discharge status: Home / Self Care | Payer: BC Managed Care – PPO | Source: Ambulatory Visit | Attending: Physician Assistant | Admitting: Physician Assistant

## 2019-01-14 ENCOUNTER — Encounter (HOSPITAL_COMMUNITY): Payer: Self-pay | Admitting: Physician Assistant

## 2019-01-14 VITALS — BP 120/84 | HR 133 | Ht 70.0 in | Wt 189.0 lb

## 2019-01-14 DIAGNOSIS — R0683 Snoring: Secondary | ICD-10-CM | POA: Diagnosis not present

## 2019-01-14 DIAGNOSIS — I4510 Unspecified right bundle-branch block: Secondary | ICD-10-CM | POA: Insufficient documentation

## 2019-01-14 DIAGNOSIS — E079 Disorder of thyroid, unspecified: Secondary | ICD-10-CM | POA: Diagnosis not present

## 2019-01-14 DIAGNOSIS — Z885 Allergy status to narcotic agent status: Secondary | ICD-10-CM | POA: Diagnosis not present

## 2019-01-14 DIAGNOSIS — Z8249 Family history of ischemic heart disease and other diseases of the circulatory system: Secondary | ICD-10-CM | POA: Diagnosis not present

## 2019-01-14 DIAGNOSIS — I10 Essential (primary) hypertension: Secondary | ICD-10-CM | POA: Diagnosis not present

## 2019-01-14 DIAGNOSIS — Z7901 Long term (current) use of anticoagulants: Secondary | ICD-10-CM | POA: Diagnosis not present

## 2019-01-14 DIAGNOSIS — R400 Somnolence: Secondary | ICD-10-CM | POA: Diagnosis not present

## 2019-01-14 DIAGNOSIS — Z886 Allergy status to analgesic agent status: Secondary | ICD-10-CM | POA: Insufficient documentation

## 2019-01-14 DIAGNOSIS — I4819 Other persistent atrial fibrillation: Secondary | ICD-10-CM | POA: Diagnosis not present

## 2019-01-14 DIAGNOSIS — Z79890 Hormone replacement therapy (postmenopausal): Secondary | ICD-10-CM | POA: Diagnosis not present

## 2019-01-14 DIAGNOSIS — N400 Enlarged prostate without lower urinary tract symptoms: Secondary | ICD-10-CM | POA: Insufficient documentation

## 2019-01-14 DIAGNOSIS — E785 Hyperlipidemia, unspecified: Secondary | ICD-10-CM | POA: Diagnosis not present

## 2019-01-14 DIAGNOSIS — Z79899 Other long term (current) drug therapy: Secondary | ICD-10-CM | POA: Insufficient documentation

## 2019-01-14 DIAGNOSIS — I483 Typical atrial flutter: Secondary | ICD-10-CM | POA: Insufficient documentation

## 2019-01-14 MED ORDER — APIXABAN 5 MG PO TABS: 5.0000 mg | ORAL_TABLET | Freq: Two times a day (BID) | ORAL | 0 refills | Status: DC

## 2019-01-14 MED ORDER — METOPROLOL SUCCINATE ER 100 MG PO TB24: 100.0000 mg | ORAL_TABLET | Freq: Every day | ORAL | 1 refills | Status: DC

## 2019-01-14 NOTE — Progress Notes (Signed)
Primary Care Physician: Tally Joe, MD Primary Cardiologist: Dr Bing Matter (formerly Donnie Aho Primary Electrophysiologist: Dr Johney Frame Referring Physician: Dr Merri Brunette   Donald Morales is a 64 y.o. male with a history of paroxysmal atrial fibrillation/typical flutter, HLD, RBBB who presents for consultation in the [facility].  The patient was initially diagnosed with atrial fibrillation in 02/2017 on event monitor after presenting with symptoms of palpitations. He was seen by Dr Johney Frame for evaluation for AAD vs ablation. At that point, his symptoms did not justify an ablation and the patient did not want any AAD therapy. He has done well since until the last 6 months he has had infrequent episodes which last minutes up to an hour or two. Two days ago, he woke early in the morning feeling like he had an URI with fatigue, dizziness on bending over, and SOB. He went to his PCP today and was found to be in atrial fibrillation with RVR. He denies palpitations. Echo and stress test with Dr Bing Matter pending.   Today, he denies symptoms of palpitations, chest pain, orthopnea, PND, lower extremity edema, presyncope, syncope, bleeding, or neurologic sequela. The patient is tolerating medications without difficulties and is otherwise without complaint today.    Atrial Fibrillation Risk Factors:  he does have symptoms of sleep apnea with snoring and daytime somnolence.  he does not have a history of rheumatic fever. he does not have a history of alcohol use. The patient does not have a history of early familial atrial fibrillation or other arrhythmias.  he has a BMI of Body mass index is 27.12 kg/m.Marland Kitchen Filed Weights   01/14/19 1515  Weight: 85.7 kg    Family History  Problem Relation Age of Onset  . Heart disease Mother   . Alcoholism Father      Atrial Fibrillation Management history:  Previous antiarrhythmic drugs: none Previous cardioversions:  none Previous ablations: none CHADS2VASC score: 1 Anticoagulation history: none   Past Medical History:  Diagnosis Date  . BPH (benign prostatic hyperplasia)   . History of epistaxis   . Hyperlipidemia   . Muscle ache   . Paroxysmal atrial fibrillation (HCC)   . Premature ventricular contraction   . RBBB   . Thyroid disease   . Typical atrial flutter [facility])    Past Surgical History:  Procedure Laterality Date  . BACK SURGERY    . ORCHIECTOMY    . TOOTH EXTRACTION      Current Outpatient Medications  Medication Sig Dispense Refill  . ibuprofen (ADVIL,MOTRIN) 200 MG tablet Take 200 mg by mouth every 6 (six) hours as needed (pain).     Marland Kitchen levothyroxine (SYNTHROID, LEVOTHROID) 100 MCG tablet Take 100 mcg by mouth daily.     . metoprolol succinate (TOPROL-XL) 100 MG 24 hr tablet Take 1 tablet (100 mg total) by mouth daily. 30 tablet 1  . omeprazole (PRILOSEC) 20 MG capsule Take 20 mg by mouth daily.     Marland Kitchen apixaban (ELIQUIS) 5 MG TABS tablet Take 1 tablet (5 mg total) by mouth 2 (two) times daily. 60 tablet 0   No current facility-administered medications for this encounter.     Allergies  Allergen Reactions  . Aspirin     81 mg - epistaxis/gums bleeding  . Codeine     REACTION: hallucinations    Social History   Socioeconomic History  . Marital status: Single    Spouse name: Not on file  . Number of children: 0  .  Years of education: college  . Highest education level: Not on file  Occupational History  . Occupation: [employer]  Social Needs  . Financial resource strain: Not on file  . Food insecurity:    Worry: Not on file    Inability: Not on file  . Transportation needs:    Medical: Not on file    Non-medical: Not on file  Tobacco Use  . Smoking status: Never Smoker  . Smokeless tobacco: Never Used  Substance and Sexual Activity  . Alcohol use: Yes    Alcohol/week: 0.0 standard drinks    Comment: rare  . Drug use: No  . Sexual activity:  Not on file  Lifestyle  . Physical activity:    Days per week: Not on file    Minutes per session: Not on file  . Stress: Not on file  Relationships  . Social connections:    Talks on phone: Not on file    Gets together: Not on file    Attends religious service: Not on file    Active member of club or organization: Not on file    Attends meetings of clubs or organizations: Not on file    Relationship status: Not on file  . Intimate partner violence:    Fear of current or ex partner: Not on file    Emotionally abused: Not on file    Physically abused: Not on file    Forced sexual activity: Not on file  Other Topics Concern  . Not on file  Social History Narrative   [place] alone in [place]   Works in custodial department at [employer]     ROS- All systems are reviewed and negative except as per the HPI above.  Physical Exam: Vitals:   01/14/19 1515  BP: 120/84  Pulse: (!) 133  Weight: 85.7 kg  Height: 5\' 10"  (1.778 m)    GEN- The patient is well appearing, alert and oriented x 3 today.   Head- normocephalic, atraumatic Eyes-  Sclera clear, conjunctiva pink Ears- hearing intact Oropharynx- clear Neck- supple  Lungs- Clear to ausculation bilaterally, normal work of breathing Heart- irregular rate and rhythm, tachycardia, no murmurs, rubs or gallops  GI- soft, NT, ND, + BS Extremities- no clubbing, cyanosis, or edema MS- no significant deformity or atrophy Skin- no rash or lesion Psych- euthymic mood, full affect Neuro- strength and sensation are intact  Wt Readings from Last 3 Encounters:  01/14/19 85.7 kg  10/30/18 86.5 kg  03/27/17 87.2 kg    EKG today demonstrates atrial fibrillation HR 133, RBBB, QRS 130, QTc 520  Echo 03/09/17 demonstrated preserved EF, no valvular disease, mild LA enlargement  Epic records are reviewed at length today  Assessment and Plan:  1. Persistent atrial fibrillation/typical atrial flutter The patient has  paroxysmal atrial fibrillation/flutter which now looks persistent.  Has symptoms of SOB and fatigue. We discussed DCCV including risks and benefits. He voices understanding and is agreeable to proceed. Ideally, would do TEE/DCCV but patient admits cost is an issue and would rather wait and be anticoagulated for 3 weeks. Start Eliquis 5 mg BID. Continue through 4 weeks post DCCV. Increase Toprol to 100 mg daily Repeat echo pending per Dr Bing MatterKrasowski. Check CBC/Bmet on f/u.  This patients CHA2DS2-VASc Score and unadjusted Ischemic Stroke Rate (% per year) is equal to 0.6 % stroke rate/year from a score of 1  Above score calculated as 1 point each if present [CHF, HTN, DM, Vascular=MI/PAD/Aortic Plaque,  Age if 76-74, or Male] Above score calculated as 2 points each if present [Age > 75, or Stroke/TIA/TE]   2. Snoring/Daytime somnolence  The importance of adequate treatment of sleep apnea was discussed today in order to improve our ability to maintain sinus rhythm long term. He reports that he had a sleep study 20 years ago which was inconclusive. Will refer to Dr Particia Lather ([facility] at [facility]) once DCCV completed.  3. HTN Stable, changes as above.   Follow up in two weeks for pre DCCV H&P and labs.   Jorja Loa PA-C Afib Clinic [facility] [address] [place], [place] [address] [phone] 01/14/2019 4:02 PM

## 2019-01-14 NOTE — Patient Instructions (Signed)
Increase metoprolol to 100mg  once a day  Eliquis 5mg  twice a day

## 2019-01-29 ENCOUNTER — Encounter (HOSPITAL_COMMUNITY): Payer: Self-pay | Admitting: Physician Assistant

## 2019-01-29 ENCOUNTER — Ambulatory Visit (HOSPITAL_COMMUNITY)
Admission: RE | Admit: 2019-01-29 | Discharge: 2019-01-29 | Disposition: A | Discharge status: Home / Self Care | Payer: BC Managed Care – PPO | Source: Ambulatory Visit | Attending: Physician Assistant | Admitting: Physician Assistant

## 2019-01-29 VITALS — BP 116/70 | HR 102 | Ht 70.0 in | Wt 183.0 lb

## 2019-01-29 DIAGNOSIS — Z8249 Family history of ischemic heart disease and other diseases of the circulatory system: Secondary | ICD-10-CM | POA: Diagnosis not present

## 2019-01-29 DIAGNOSIS — E785 Hyperlipidemia, unspecified: Secondary | ICD-10-CM | POA: Diagnosis not present

## 2019-01-29 DIAGNOSIS — R400 Somnolence: Secondary | ICD-10-CM | POA: Diagnosis not present

## 2019-01-29 DIAGNOSIS — R0683 Snoring: Secondary | ICD-10-CM | POA: Diagnosis not present

## 2019-01-29 DIAGNOSIS — Z7901 Long term (current) use of anticoagulants: Secondary | ICD-10-CM | POA: Diagnosis not present

## 2019-01-29 DIAGNOSIS — I480 Paroxysmal atrial fibrillation: Secondary | ICD-10-CM | POA: Insufficient documentation

## 2019-01-29 DIAGNOSIS — I483 Typical atrial flutter: Secondary | ICD-10-CM | POA: Insufficient documentation

## 2019-01-29 DIAGNOSIS — I493 Ventricular premature depolarization: Secondary | ICD-10-CM | POA: Diagnosis not present

## 2019-01-29 DIAGNOSIS — I4819 Other persistent atrial fibrillation: Secondary | ICD-10-CM | POA: Diagnosis not present

## 2019-01-29 DIAGNOSIS — I10 Essential (primary) hypertension: Secondary | ICD-10-CM | POA: Insufficient documentation

## 2019-01-29 DIAGNOSIS — Z79899 Other long term (current) drug therapy: Secondary | ICD-10-CM | POA: Insufficient documentation

## 2019-01-29 DIAGNOSIS — E079 Disorder of thyroid, unspecified: Secondary | ICD-10-CM | POA: Insufficient documentation

## 2019-01-29 DIAGNOSIS — I4510 Unspecified right bundle-branch block: Secondary | ICD-10-CM | POA: Diagnosis not present

## 2019-01-29 NOTE — Progress Notes (Signed)
Primary Care Physician: Tally Joe, MD Primary Cardiologist: Dr Bing Matter (formerly Donnie Aho) Primary Electrophysiologist: Dr Johney Frame Referring Physician: Dr Merri Brunette   Donald Morales is a 63 y.o. male with a history of paroxysmal atrial fibrillation/typical flutter, HLD, RBBB who presents for consultation in the [facility] Fibr[facility].  The patient was initially diagnosed with atrial fibrillation in 02/2017 on event monitor after presenting with symptoms of palpitations. He was seen by Dr Johney Frame for evaluation for AAD vs ablation. At that point, his symptoms did not justify an ablation and the patient did not want any AAD therapy. Patient reports that his fatigue and SOB have improved although he is still in afib today. He reports no missed doses of anticoagulation.   Today, he denies symptoms of palpitations, chest pain, orthopnea, PND, lower extremity edema, presyncope, syncope, bleeding, or neurologic sequela. The patient is tolerating medications without difficulties and is otherwise without complaint today.    Atrial Fibrillation Risk Factors:  he does have symptoms of sleep apnea with snoring and daytime somnolence.  he does not have a history of rheumatic fever. he does not have a history of alcohol use. The patient does not have a history of early familial atrial fibrillation or other arrhythmias.  he has a BMI of Body mass index is 26.26 kg/m.Marland Kitchen Filed Weights   01/29/19 1541  Weight: 83 kg    Family History  Problem Relation Age of Onset  . Heart disease Mother   . Alcoholism Father      Atrial Fibrillation Management history:  Previous antiarrhythmic drugs: none Previous cardioversions: none Previous ablations: none CHADS2VASC score: 1 Anticoagulation history: Eliquis   Past Medical History:  Diagnosis Date  . BPH (benign prostatic hyperplasia)   . History of epistaxis   . Hyperlipidemia   . Muscle ache   . Paroxysmal atrial  fibrillation (HCC)   . Premature ventricular contraction   . RBBB   . Thyroid disease   . Typical atrial flutter [facility])    Past Surgical History:  Procedure Laterality Date  . BACK SURGERY    . ORCHIECTOMY    . TOOTH EXTRACTION      Current Outpatient Medications  Medication Sig Dispense Refill  . apixaban (ELIQUIS) 5 MG TABS tablet Take 1 tablet (5 mg total) by mouth 2 (two) times daily. 60 tablet 0  . ibuprofen (ADVIL,MOTRIN) 200 MG tablet Take 200 mg by mouth every 6 (six) hours as needed (pain).     Marland Kitchen levothyroxine (SYNTHROID, LEVOTHROID) 100 MCG tablet Take 100 mcg by mouth daily.     . metoprolol succinate (TOPROL-XL) 100 MG 24 hr tablet Take 1 tablet (100 mg total) by mouth daily. 30 tablet 1  . omeprazole (PRILOSEC) 20 MG capsule Take 20 mg by mouth daily.     . NON FORMULARY Angstrom Magnesium supplement liquid     No current facility-administered medications for this encounter.     Allergies  Allergen Reactions  . Aspirin     81 mg - epistaxis/gums bleeding  . Codeine     REACTION: hallucinations    Social History   Socioeconomic History  . Marital status: Single    Spouse name: Not on file  . Number of children: 0  . Years of education: college  . Highest education level: Not on file  Occupational History  . Occupation: [employer]  Social Needs  . Financial resource strain: Not on file  . Food insecurity:    Worry:  Not on file    Inability: Not on file  . Transportation needs:    Medical: Not on file    Non-medical: Not on file  Tobacco Use  . Smoking status: Never Smoker  . Smokeless tobacco: Never Used  Substance and Sexual Activity  . Alcohol use: Yes    Alcohol/week: 0.0 standard drinks    Comment: rare  . Drug use: No  . Sexual activity: Not on file  Lifestyle  . Physical activity:    Days per week: Not on file    Minutes per session: Not on file  . Stress: Not on file  Relationships  . Social connections:    Talks on  phone: Not on file    Gets together: Not on file    Attends religious service: Not on file    Active member of club or organization: Not on file    Attends meetings of clubs or organizations: Not on file    Relationship status: Not on file  . Intimate partner violence:    Fear of current or ex partner: Not on file    Emotionally abused: Not on file    Physically abused: Not on file    Forced sexual activity: Not on file  Other Topics Concern  . 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  Physical Exam: Vitals:   01/29/19 1541  BP: 116/70  Pulse: (!) 102  Weight: 83 kg  Height: 5\' 10"  (1.778 m)    GEN- The patient is well appearing, alert and oriented x 3 today.   HEENT-head normocephalic, atraumatic, sclera clear, conjunctiva pink, hearing intact, trachea midline. Lungs- Clear to ausculation bilaterally, normal work of breathing Heart- irregular rate and rhythm, no murmurs, rubs or gallops  GI- soft, NT, ND, + BS Extremities- no clubbing, cyanosis, or edema MS- no significant deformity or atrophy Skin- no rash or lesion Psych- euthymic mood, full affect Neuro- strength and sensation are intact   Wt Readings from Last 3 Encounters:  01/29/19 83 kg  01/14/19 85.7 kg  10/30/18 86.5 kg    EKG today demonstrates atrial fibrillation HR 102, RBBB, LPFB, QRS 136, QTc 505  Echo 03/09/17 demonstrated preserved EF, no valvular disease, mild LA enlargement  Epic records are reviewed at length today  Assessment and Plan:  1. Persistent atrial fibrillation/typical atrial flutter The patient has persistent atrial fibrillation/flutter. Symptomatically improved with better rate control. We discussed therapeutic options including DCCV vs chemical cardioversion with flecainide. Patient would prefer to try medication first.    Per Dr Johney FrameAllred, Will start flecainide 02/04/19 (3 weeks after starting A/C) Continue Eliquis through 4 weeks post cardioversion.  Continue Toprol to 100 mg daily Check Bmet/CBC/mag on f/u Repeat echo pending per Dr Bing MatterKrasowski.  This patients CHA2DS2-VASc Score and unadjusted Ischemic Stroke Rate (% per year) is equal to 0.6 % stroke rate/year from a score of 1  Above score calculated as 1 point each if present [CHF, HTN, DM, Vascular=MI/PAD/Aortic Plaque, Age if 65-74, or Male] Above score calculated as 2 points each if present [Age > 75, or Stroke/TIA/TE]   2. Snoring/Daytime somnolence  He reports that he had a sleep study 20 years ago which was inconclusive. Will refer to Dr Particia Lathersbourne ([facility]) once DCCV completed.  3. HTN Stable, no changes today.  Follow up in Afib clinic in 2 weeks.    Jorja Loa PA-C Afib Clinic [facility] [address] [place], [place] [address] [phone] 01/29/2019 4:43 PM

## 2019-01-29 NOTE — Patient Instructions (Signed)
Start Flecainide 50mg  twice a day on March 3rd

## 2019-01-30 ENCOUNTER — Ambulatory Visit (HOSPITAL_COMMUNITY): Payer: BC Managed Care – PPO | Admitting: Physician Assistant

## 2019-02-03 ENCOUNTER — Other Ambulatory Visit (HOSPITAL_COMMUNITY): Payer: Self-pay | Admitting: *Deleted

## 2019-02-03 MED ORDER — FLECAINIDE ACETATE 50 MG PO TABS: 50.0000 mg | ORAL_TABLET | Freq: Two times a day (BID) | ORAL | 3 refills | Status: DC

## 2019-02-06 ENCOUNTER — Other Ambulatory Visit (HOSPITAL_COMMUNITY): Payer: Self-pay | Admitting: Physician Assistant

## 2019-02-11 ENCOUNTER — Encounter (HOSPITAL_COMMUNITY): Payer: Self-pay | Admitting: Physician Assistant

## 2019-02-11 ENCOUNTER — Ambulatory Visit (HOSPITAL_COMMUNITY)
Admission: RE | Admit: 2019-02-11 | Discharge: 2019-02-11 | Disposition: A | Discharge status: Home / Self Care | Payer: BC Managed Care – PPO | Source: Ambulatory Visit | Attending: Physician Assistant | Admitting: Physician Assistant

## 2019-02-11 VITALS — BP 122/74 | HR 89 | Ht 70.0 in | Wt 184.0 lb

## 2019-02-11 DIAGNOSIS — I452 Bifascicular block: Secondary | ICD-10-CM | POA: Insufficient documentation

## 2019-02-11 DIAGNOSIS — Z886 Allergy status to analgesic agent status: Secondary | ICD-10-CM | POA: Insufficient documentation

## 2019-02-11 DIAGNOSIS — Z79890 Hormone replacement therapy (postmenopausal): Secondary | ICD-10-CM | POA: Insufficient documentation

## 2019-02-11 DIAGNOSIS — Z7901 Long term (current) use of anticoagulants: Secondary | ICD-10-CM | POA: Insufficient documentation

## 2019-02-11 DIAGNOSIS — Z79899 Other long term (current) drug therapy: Secondary | ICD-10-CM | POA: Insufficient documentation

## 2019-02-11 DIAGNOSIS — I445 Left posterior fascicular block: Secondary | ICD-10-CM | POA: Insufficient documentation

## 2019-02-11 DIAGNOSIS — R400 Somnolence: Secondary | ICD-10-CM | POA: Insufficient documentation

## 2019-02-11 DIAGNOSIS — E079 Disorder of thyroid, unspecified: Secondary | ICD-10-CM | POA: Insufficient documentation

## 2019-02-11 DIAGNOSIS — R9431 Abnormal electrocardiogram [ECG] [EKG]: Secondary | ICD-10-CM | POA: Diagnosis not present

## 2019-02-11 DIAGNOSIS — I10 Essential (primary) hypertension: Secondary | ICD-10-CM | POA: Diagnosis not present

## 2019-02-11 DIAGNOSIS — Z8249 Family history of ischemic heart disease and other diseases of the circulatory system: Secondary | ICD-10-CM | POA: Diagnosis not present

## 2019-02-11 DIAGNOSIS — R0683 Snoring: Secondary | ICD-10-CM | POA: Diagnosis not present

## 2019-02-11 DIAGNOSIS — I483 Typical atrial flutter: Secondary | ICD-10-CM | POA: Insufficient documentation

## 2019-02-11 DIAGNOSIS — I4510 Unspecified right bundle-branch block: Secondary | ICD-10-CM | POA: Diagnosis not present

## 2019-02-11 DIAGNOSIS — Z885 Allergy status to narcotic agent status: Secondary | ICD-10-CM | POA: Diagnosis not present

## 2019-02-11 DIAGNOSIS — I4819 Other persistent atrial fibrillation: Secondary | ICD-10-CM | POA: Diagnosis present

## 2019-02-11 NOTE — Progress Notes (Signed)
Primary Care Physician: Tally Joe, MD Primary Cardiologist: Dr Bing Matter (formerly Donnie Aho) Primary Electrophysiologist: Dr Johney Frame Referring Physician: Dr Merri Brunette   Donald Morales is a 63 y.o. male with a history of paroxysmal atrial fibrillation/typical flutter, HLD, RBBB who presents for consultation in the [facility]ibr[facility].  The patient was initially diagnosed with atrial fibrillation in 02/2017 on event monitor after presenting with symptoms of palpitations. He was seen by Dr Johney Frame for evaluation for AAD vs ablation. At that point, his symptoms did not justify an ablation and the patient did not want any AAD therapy.   On follow up today, patient reports that he continues to have SOB with exertion but this has improved. He has been on flecainide for one week and remains in afib today.  Today, he denies symptoms of palpitations, chest pain, orthopnea, PND, lower extremity edema, presyncope, syncope, bleeding, or neurologic sequela. The patient is tolerating medications without difficulties and is otherwise without complaint today.    Atrial Fibrillation Risk Factors:  he does have symptoms of sleep apnea with snoring and daytime somnolence.  he does not have a history of rheumatic fever. he does not have a history of alcohol use. The patient does not have a history of early familial atrial fibrillation or other arrhythmias.  he has a BMI of Body mass index is 26.4 kg/m.Marland Kitchen Filed Weights   02/11/19 1525  Weight: 83.5 kg    Family History  Problem Relation Age of Onset  . Heart disease Mother   . Alcoholism Father      Atrial Fibrillation Management history:  Previous antiarrhythmic drugs: flecainide Previous cardioversions: none Previous ablations: none CHADS2VASC score: 1 Anticoagulation history: Eliquis   Past Medical History:  Diagnosis Date  . BPH (benign prostatic hyperplasia)   . History of epistaxis   . Hyperlipidemia   .  Muscle ache   . Paroxysmal atrial fibrillation (HCC)   . Premature ventricular contraction   . RBBB   . Thyroid disease   . Typical atrial flutter [facility])    Past Surgical History:  Procedure Laterality Date  . BACK SURGERY    . ORCHIECTOMY    . TOOTH EXTRACTION      Current Outpatient Medications  Medication Sig Dispense Refill  . apixaban (ELIQUIS) 5 MG TABS tablet Take 1 tablet (5 mg total) by mouth 2 (two) times daily. 60 tablet 0  . flecainide (TAMBOCOR) 50 MG tablet Take 1 tablet (50 mg total) by mouth 2 (two) times daily. 60 tablet 3  . ibuprofen (ADVIL,MOTRIN) 200 MG tablet Take 200 mg by mouth every 6 (six) hours as needed (pain).     Marland Kitchen levothyroxine (SYNTHROID, LEVOTHROID) 100 MCG tablet Take 100 mcg by mouth daily.     . metoprolol succinate (TOPROL-XL) 100 MG 24 hr tablet TAKE 1 TABLET BY MOUTH EVERY DAY 30 tablet 1  . NON FORMULARY Angstrom Magnesium supplement liquid    . omeprazole (PRILOSEC) 20 MG capsule Take 20 mg by mouth daily.      No current facility-administered medications for this encounter.     Allergies  Allergen Reactions  . Aspirin     81 mg - epistaxis/gums bleeding  . Codeine     REACTION: hallucinations    Social History   Socioeconomic History  . Marital status: Single    Spouse name: Not on file  . Number of children: 0  . Years of education: college  . Highest education level: Not on  file  Occupational History  . Occupation: [employer]  Social Needs  . Financial resource strain: Not on file  . Food insecurity:    Worry: Not on file    Inability: Not on file  . Transportation needs:    Medical: Not on file    Non-medical: Not on file  Tobacco Use  . Smoking status: Never Smoker  . Smokeless tobacco: Never Used  Substance and Sexual Activity  . Alcohol use: Yes    Alcohol/week: 0.0 standard drinks    Comment: rare  . Drug use: No  . Sexual activity: Not on file  Lifestyle  . Physical activity:    Days per  week: Not on file    Minutes per session: Not on file  . Stress: Not on file  Relationships  . Social connections:    Talks on phone: Not on file    Gets together: Not on file    Attends religious service: Not on file    Active member of club or organization: Not on file    Attends meetings of clubs or organizations: Not on file    Relationship status: Not on file  . Intimate partner violence:    Fear of current or ex partner: Not on file    Emotionally abused: Not on file    Physically abused: Not on file    Forced sexual activity: Not on file  Other Topics Concern  . 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  Physical Exam: Vitals:   02/11/19 1525  BP: 122/74  Pulse: 89  Weight: 83.5 kg  Height: 5\' 10"  (1.778 m)    GEN- The patient is well appearing, alert and oriented x 3 today.   HEENT-head normocephalic, atraumatic, sclera clear, conjunctiva pink, hearing intact, trachea midline. Lungs- Clear to ausculation bilaterally, normal work of breathing Heart- irregular rate and rhythm, no murmurs, rubs or gallops  GI- soft, NT, ND, + BS Extremities- no clubbing, cyanosis, or edema MS- no significant deformity or atrophy Skin- no rash or lesion Psych- euthymic mood, full affect Neuro- strength and sensation are intact    Wt Readings from Last 3 Encounters:  02/11/19 83.5 kg  01/29/19 83 kg  01/14/19 85.7 kg    EKG today demonstrates atrial fibrillation HR 89, RBBB, LAFB, QRS 148, QTc 506  Echo 03/09/17 demonstrated preserved EF, no valvular disease, mild LA enlargement  Epic records are reviewed at length today  Assessment and Plan:  1. Persistent atrial fibrillation/typical atrial flutter The patient has persistent atrial fibrillation/flutter. Patient reports that overall he feels well but continues to  have exertional dyspnea. He is in afib today. We again had a long discussion about therapeutic options including DCCV. Patient is hesitant to make a change in therapy or have any procedures. He would like to take time to consider his options. We did discuss that the longer he is in afib, the more difficult it is to restore SR. Patient voices understanding. Continue Toprol to 100 mg daily Repeat echo ordered per Dr Bing Matter.  This patients CHA2DS2-VASc Score and unadjusted Ischemic Stroke Rate (% per year) is equal to 0.6 % stroke rate/year from a score of 1  Above score calculated as 1 point each if present [CHF, HTN, DM, Vascular=MI/PAD/Aortic Plaque, Age if 10-74, or Male]  Above score calculated as 2 points each if present [Age > 75, or Stroke/TIA/TE]  2. Snoring/Daytime somnolence  He reports that he had a sleep study 20 years ago which was inconclusive. He is not wanting to pursue sleep study at this time.  3. HTN Stable, no changes today.   Follow up with Dr Johney Frame in one month.   Jorja Loa PA-C Afib Clinic [facility] [address] [place], [place] [address] [phone] 02/11/2019 4:15 PM

## 2019-02-11 NOTE — Patient Instructions (Signed)
Follow up with DR. Allred in 1 month

## 2019-02-22 ENCOUNTER — Other Ambulatory Visit: Payer: Self-pay | Admitting: Cardiology

## 2019-03-10 ENCOUNTER — Other Ambulatory Visit (HOSPITAL_COMMUNITY): Payer: Self-pay | Admitting: *Deleted

## 2019-03-10 MED ORDER — APIXABAN 5 MG PO TABS: 5.0000 mg | ORAL_TABLET | Freq: Two times a day (BID) | ORAL | 6 refills | Status: DC

## 2019-03-17 ENCOUNTER — Ambulatory Visit: Payer: BC Managed Care – PPO | Admitting: Internal Medicine

## 2019-04-12 ENCOUNTER — Other Ambulatory Visit (HOSPITAL_COMMUNITY): Payer: Self-pay | Admitting: Physician Assistant

## 2019-06-25 ENCOUNTER — Telehealth: Payer: Self-pay

## 2019-06-25 NOTE — Telephone Encounter (Signed)
NOTES FROM  [facility] [phone], REFERRAL SENT TO SCHEDULING

## 2019-08-22 ENCOUNTER — Telehealth: Payer: Self-pay

## 2019-08-22 NOTE — Telephone Encounter (Signed)
Left a message regarding appt on 08/25/19. 

## 2019-08-25 ENCOUNTER — Encounter: Payer: Self-pay | Admitting: Internal Medicine

## 2019-08-25 ENCOUNTER — Telehealth (INDEPENDENT_AMBULATORY_CARE_PROVIDER_SITE_OTHER): Payer: BC Managed Care – PPO | Admitting: Internal Medicine

## 2019-08-25 VITALS — BP 130/80 | HR 70 | Ht 70.0 in | Wt 190.0 lb

## 2019-08-25 DIAGNOSIS — I452 Bifascicular block: Secondary | ICD-10-CM | POA: Diagnosis not present

## 2019-08-25 DIAGNOSIS — I4819 Other persistent atrial fibrillation: Secondary | ICD-10-CM

## 2019-08-25 MED ORDER — DILTIAZEM HCL ER COATED BEADS 240 MG PO CP24: 240.0000 mg | ORAL_CAPSULE | Freq: Every day | ORAL | 3 refills | Status: DC

## 2019-08-25 NOTE — Progress Notes (Signed)
Electrophysiology TeleHealth Note  Due to national recommendations of social distancing due to [place] 19, an audio telehealth visit is felt to be most appropriate for this patient at this time.  Verbal consent was obtained by me for the telehealth visit today.  The patient does not have capability for a virtual visit.  A phone visit is therefore required today.   Date:  08/25/2019   ID:  [name], DOB 01/24/1956, MRN [id number]  Location: patient's home  Provider location:  [facility] [place]  Evaluation Performed: Follow-up visit  PCP:  Antony Contras, MD   Electrophysiologist:  Dr Rayann Heman  Chief Complaint:  palpitations  History of Present Illness:    Donald Morales is a 63 y.o. male who presents via telehealth conferencing today.  Since last being seen in our clinic, the patient reports doing very well.  He is in afib most of the time.  He states "I have good days and not so good days".  He did have syncope 07/18/2019.  While walking in his kitchen, he collapsed with LOC. He has rare dizziness in the morning but no other episodes.  Today, he denies symptoms of palpitations, chest pain, shortness of breath,  lower extremity edema, further or further syncope.  The patient is otherwise without complaint today.  The patient denies symptoms of fevers, chills, cough, or new SOB worrisome for COVID 19.  Past Medical History:  Diagnosis Date  . BPH (benign prostatic hyperplasia)   . History of epistaxis   . Hyperlipidemia   . Muscle ache   . Persistent atrial fibrillation   . Premature ventricular contraction   . RBBB   . RBBB   . Thyroid disease   . Typical atrial flutter [facility])     Past Surgical History:  Procedure Laterality Date  . BACK SURGERY    . ORCHIECTOMY    . TOOTH EXTRACTION      Current Outpatient Medications  Medication Sig Dispense Refill  . apixaban (ELIQUIS) 5 MG TABS tablet Take 1 tablet (5 mg total) by mouth 2 (two) times daily. 60 tablet 6  .  levothyroxine (SYNTHROID, LEVOTHROID) 100 MCG tablet Take 100 mcg by mouth daily.     . metoprolol succinate (TOPROL-XL) 100 MG 24 hr tablet TAKE 1 TABLET BY MOUTH EVERY DAY 30 tablet 1  . flecainide (TAMBOCOR) 50 MG tablet Take 1 tablet (50 mg total) by mouth 2 (two) times daily. (Patient not taking: Reported on 08/25/2019) 60 tablet 3   No current facility-administered medications for this visit.     Allergies:   Aspirin and Codeine   Social History:  The patient  reports that he has never smoked. He has never used smokeless tobacco. He reports current alcohol use. He reports that he does not use drugs.   Family History:  The patient's  family history includes Alcoholism in his father; Heart disease in his mother.   ROS:  Please see the history of present illness.   All other systems are personally reviewed and negative.    Exam:    Vital Signs:  BP 130/80   Pulse 70   Ht 5\' 10"  (1.778 m)   Wt 190 lb (86.2 kg)   BMI 27.26 kg/m   Well sounding, alert and conversant   Labs/Other Tests and Data Reviewed:    Recent Labs: No results found for requested labs within last 8760 hours.   Wt Readings from Last 3 Encounters:  08/25/19 190 lb (86.2 kg)  02/11/19 184 lb (83.5 kg)  01/29/19 183 lb (83 kg)     ASSESSMENT & PLAN:    1.  Typical atrial flutter/ persistent atrial fibrillation chads2vasc score is 1. He is on eliquis Rate controlled Minimally symptomatic Not interested in ablation He feels that toprol is making him irritable. Stop toprol Start diltiazem CD 240mg  daily  2. HTN Stable No change required today  3. Snoring Declines sleep study  4. Syncope I have informed him that he cannot drive for 6 months I have advised compliance with medicine and an echo.  He is clear in his decision to decline echo at this time due to costs.  He has a high deductible insurance and does not feel that he can afford health care currently. I have also advised 30 day monitor to  evaluate for cause of his arrhythmia.  He states that he previously wore a monitor and did not find it tolerable.  I have therefore advised long term monitoring.  He is clear in his decision to decline monitoring at this time.  Follow-up:  AF clinic in 3 months   Patient Risk:  after full review of this patients clinical status, I feel that they are at moderate risk at this time.  Today, I have spent 15 minutes with the patient with telehealth technology discussing arrhythmia management .    Randolm IdolSigned, Edwyn Inclan, MD  08/25/2019 2:37 PM     [facility] [address] [place] [address] [phone] (office) [phone] (fax)

## 2020-01-24 ENCOUNTER — Ambulatory Visit: Payer: BC Managed Care – PPO | Attending: Internal Medicine

## 2020-01-24 DIAGNOSIS — Z23 Encounter for immunization: Secondary | ICD-10-CM | POA: Insufficient documentation

## 2020-01-24 NOTE — Progress Notes (Signed)
   Covid-19 Vaccination Clinic  Name:  Donald Morales    MRN: [id number] DOB: [birth year]  01/24/2020  Donald Morales was observed post Covid-19 immunization for 15 minutes without incidence. He was provided with Vaccine Information Sheet and instruction to access the V-Safe system.   Donald Morales was instructed to call 911 with any severe reactions post vaccine: Marland Kitchen Difficulty breathing  . Swelling of your face and throat  . A fast heartbeat  . A bad rash all over your body  . Dizziness and weakness    Immunizations Administered    Name Date Dose VIS Date Route   Pfizer COVID-19 Vaccine 01/24/2020  4:12 PM 0.3 mL 11/14/2019 Intramuscular   Manufacturer: [employer], [employer]   Lot: [id number]   NDC: [address]-3

## 2020-02-17 ENCOUNTER — Ambulatory Visit: Payer: BC Managed Care – PPO | Attending: Internal Medicine

## 2020-02-17 DIAGNOSIS — Z23 Encounter for immunization: Secondary | ICD-10-CM

## 2020-02-17 NOTE — Progress Notes (Signed)
   Covid-19 Vaccination Clinic  Name:  HELIX LAFONTAINE    MRN: [id number] DOB: Nov 08, 1956  02/17/2020  Mr. Arduini was observed post Covid-19 immunization for 15 minutes without incident. He was provided with Vaccine Information Sheet and instruction to access the V-Safe system.   Mr. Mcknight was instructed to call 911 with any severe reactions post vaccine: Marland Kitchen Difficulty breathing  . Swelling of face and throat  . A fast heartbeat  . A bad rash all over body  . Dizziness and weakness   Immunizations Administered    Name Date Dose VIS Date Route   Pfizer COVID-19 Vaccine 02/17/2020  3:06 PM 0.3 mL 11/14/2019 Intramuscular   Manufacturer: [employer], [employer]   Lot: [id number]   NDC: [address]-4

## 2020-04-18 ENCOUNTER — Other Ambulatory Visit (HOSPITAL_COMMUNITY): Payer: Self-pay | Admitting: Physician Assistant

## 2020-04-19 NOTE — Telephone Encounter (Signed)
Prescription refill request for Eliquis received.  Last office visit: [facility], 08/25/2019 Scr: 1.13, 06/25/2019 Age:  64 yo Weight: 86.2 kg   Prescription refill sent.

## 2020-06-14 ENCOUNTER — Other Ambulatory Visit: Payer: BC Managed Care – PPO

## 2020-08-18 ENCOUNTER — Other Ambulatory Visit: Payer: Self-pay | Admitting: Internal Medicine

## 2020-08-27 ENCOUNTER — Ambulatory Visit: Payer: BC Managed Care – PPO | Admitting: Urology

## 2021-02-05 ENCOUNTER — Other Ambulatory Visit: Payer: Self-pay

## 2021-02-07 MED ORDER — DILTIAZEM HCL ER COATED BEADS 240 MG PO CP24: 240.0000 mg | ORAL_CAPSULE | Freq: Every day | ORAL | 0 refills | Status: DC

## 2021-03-01 ENCOUNTER — Other Ambulatory Visit: Payer: Self-pay | Admitting: Internal Medicine

## 2021-03-28 ENCOUNTER — Other Ambulatory Visit: Payer: Self-pay | Admitting: Internal Medicine

## 2021-04-23 ENCOUNTER — Other Ambulatory Visit: Payer: Self-pay | Admitting: Internal Medicine

## 2021-05-25 ENCOUNTER — Other Ambulatory Visit: Payer: Self-pay | Admitting: Internal Medicine

## 2021-09-14 ENCOUNTER — Ambulatory Visit: Payer: BC Managed Care – PPO | Admitting: Internal Medicine

## 2021-09-14 ENCOUNTER — Other Ambulatory Visit: Payer: Self-pay

## 2021-09-14 VITALS — BP 132/86 | HR 91 | Ht 68.0 in | Wt 186.2 lb

## 2021-09-14 DIAGNOSIS — I483 Typical atrial flutter: Secondary | ICD-10-CM

## 2021-09-14 DIAGNOSIS — I4819 Other persistent atrial fibrillation: Secondary | ICD-10-CM

## 2021-09-14 DIAGNOSIS — I10 Essential (primary) hypertension: Secondary | ICD-10-CM | POA: Diagnosis not present

## 2021-09-14 DIAGNOSIS — R55 Syncope and collapse: Secondary | ICD-10-CM

## 2021-09-14 NOTE — Progress Notes (Signed)
   PCP: Tally Joe, MD   Primary EP: Dr Shaune Pascal is a 65 y.o. male who presents today for routine electrophysiology followup.  I have not seen him in 2 years.  Since last being seen in our clinic, the patient reports doing very well.  Today, he denies symptoms of palpitations, chest pain, shortness of breath,  lower extremity edema, dizziness, presyncope, or syncope.  The patient is otherwise without complaint today.   Past Medical History:  Diagnosis Date   BPH (benign prostatic hyperplasia)    History of epistaxis    Hyperlipidemia    Muscle ache    Persistent atrial fibrillation    Premature ventricular contraction    RBBB    RBBB    Thyroid disease    Typical atrial flutter (HCC)    Past Surgical History:  Procedure Laterality Date   BACK SURGERY     ORCHIECTOMY     TOOTH EXTRACTION      ROS- all systems are reviewed and negatives except as per HPI above  Current Outpatient Medications  Medication Sig Dispense Refill   diltiazem (CARDIZEM CD) 240 MG 24 hr capsule Take 1 capsule (240 mg total) by mouth daily. Please make overdue appt with Dr. Johney Frame before anymore refills. Thank you 2nd attempt 15 capsule 0   levothyroxine (SYNTHROID) 75 MCG tablet Take 75 mcg by mouth daily.     No current facility-administered medications for this visit.    Physical Exam: Vitals:   09/14/21 1618  BP: 132/86  Pulse: 91  SpO2: 97%  Weight: 186 lb 3.2 oz (84.5 kg)  Height: 5\' 8"  (1.727 m)    GEN- The patient is well appearing, alert and oriented x 3 today.   Head- normocephalic, atraumatic Eyes-  Sclera clear, conjunctiva pink Ears- hearing intact Oropharynx- clear Lungs- Clear to ausculation bilaterally, normal work of breathing Heart- irregular rate and rhythm  GI- soft, NT, ND, + BS Extremities- no clubbing, cyanosis, or edema  Wt Readings from Last 3 Encounters:  09/14/21 186 lb 3.2 oz (84.5 kg)  08/25/19 190 lb (86.2 kg)  02/11/19 184 lb (83.5 kg)     EKG tracing ordered today is personally reviewed and shows afib, 91 bpm, RBBB  Assessment and Plan:  Longstanding persistent afib/ history of atrial flutter Chads2vasc score is 1.  He is on eliquis Rate controlled and minimally symptomatic Declines echo today  2. HTN Stable No change required today  3. Snoring Declines sleep study  4. Syncope No recurrence since 2020 event.  He declined event monitor and echo then  He continues to decline echo No further syncope  Return to see EP APP annually  2021 MD, [facility] 09/14/2021 4:28 PM

## 2021-09-14 NOTE — Patient Instructions (Addendum)

## 2022-01-25 ENCOUNTER — Telehealth: Payer: Self-pay | Admitting: Internal Medicine

## 2022-01-25 NOTE — Telephone Encounter (Signed)
Returned call to Pt.  Advised that there was an opening to be seen at [facility] tomorrow at 10:15 am if Pt is interested.  He would like appt.  Appt scheduled and directions given.

## 2022-01-25 NOTE — Telephone Encounter (Signed)
Patient was in the office to make an appointment with [facility] due to he is having Atrial flutters and sob.  This has been going on x 2 week and he was out of work x several days due to flutters.   Explained that Dr. Rayann Heman is not here again until 03/09/22.   Ask if he would like to see the Pa and he declined due to have a bad experience and only wanted to see the MD.  I made the appointment for 03/09/22, but he also wanted to discuss what is going on.

## 2022-01-26 ENCOUNTER — Other Ambulatory Visit: Payer: Self-pay

## 2022-01-26 ENCOUNTER — Ambulatory Visit (HOSPITAL_BASED_OUTPATIENT_CLINIC_OR_DEPARTMENT_OTHER): Payer: BC Managed Care – PPO | Admitting: Internal Medicine

## 2022-01-26 ENCOUNTER — Encounter (HOSPITAL_BASED_OUTPATIENT_CLINIC_OR_DEPARTMENT_OTHER): Payer: Self-pay | Admitting: Internal Medicine

## 2022-01-26 VITALS — BP 158/82 | HR 129 | Ht 68.0 in | Wt 184.0 lb

## 2022-01-26 DIAGNOSIS — I10 Essential (primary) hypertension: Secondary | ICD-10-CM

## 2022-01-26 DIAGNOSIS — I4819 Other persistent atrial fibrillation: Secondary | ICD-10-CM | POA: Diagnosis not present

## 2022-01-26 MED ORDER — DILTIAZEM HCL ER COATED BEADS 240 MG PO CP24: 240.0000 mg | ORAL_CAPSULE | Freq: Two times a day (BID) | ORAL | 3 refills | Status: DC

## 2022-01-26 NOTE — Patient Instructions (Addendum)
Medication Instructions:  Your physician has recommended you make the following change in your medication:    INCREASE your Diltiazem CD 240 mg-  Take one capsule by mouth twice a day  Labwork: None ordered.  Testing/Procedures: Your physician has requested that you have an echocardiogram. Echocardiography is a painless test that uses sound waves to create images of your heart. It provides your doctor with information about the size and shape of your heart and how well your hearts chambers and valves are working. This procedure takes approximately one hour. There are no restrictions for this procedure.  Please schedule for ECHO  Follow-Up: Your physician wants you to follow-up in: 2 weeks with the AFIB clinic.  Any Other Special Instructions Will Be Listed Below (If Applicable).  If you need a refill on your cardiac medications before your next appointment, please call your pharmacy.

## 2022-01-26 NOTE — Progress Notes (Signed)
PCP: Antony Contras, MD   Primary EP: Dr Jiles Crocker is a 66 y.o. male who presents today for routine electrophysiology followup.  Since last being seen in our clinic, the patient continues to decline.  Due to lack of finances, he has largely neglected his afib over the past few years.  He has been reluctant to have diagnostic or therapeutic tests required. He has noticed increasing palpitations and fatigue.  He has SOB.  He is working as a Sports coach for [employer] and finds that it has become increasingly difficult to do his job.  He states that he had to call out of work several days last week and has been directed to HR for possible corrective action.  Today, he denies symptoms of palpitations, chest pain,  lower extremity edema, dizziness, presyncope, or syncope.  The patient is otherwise without complaint today.   Past Medical History:  Diagnosis Date   BPH (benign prostatic hyperplasia)    History of epistaxis    Hyperlipidemia    Muscle ache    Persistent atrial fibrillation (HCC)    Premature ventricular contraction    RBBB    RBBB    Thyroid disease    Typical atrial flutter (HCC)    Past Surgical History:  Procedure Laterality Date   BACK SURGERY     ORCHIECTOMY     TOOTH EXTRACTION      ROS- all systems are reviewed and negatives except as per HPI above  Current Outpatient Medications  Medication Sig Dispense Refill   diltiazem (CARDIZEM CD) 240 MG 24 hr capsule Take 1 capsule (240 mg total) by mouth daily. Please make overdue appt with Dr. Rayann Heman before anymore refills. Thank you 2nd attempt 15 capsule 0   levothyroxine (SYNTHROID) 88 MCG tablet Take 88 mcg by mouth daily before breakfast.     No current facility-administered medications for this visit.    Physical Exam: Vitals:   01/26/22 1029  BP: (!) 158/82  Pulse: (!) 129  SpO2: 97%  Weight: 184 lb (83.5 kg)  Height: 5\' 8"  (1.727 m)    GEN- The patient is well appearing, alert and oriented x  3 today.   Head- normocephalic, atraumatic Eyes-  Sclera clear, conjunctiva pink Ears- hearing intact Oropharynx- clear Lungs- Clear to ausculation bilaterally, normal work of breathing Heart- tachycardic irregular rhythm GI- soft, NT, ND, + BS Extremities- no clubbing, cyanosis, or edema  Wt Readings from Last 3 Encounters:  01/26/22 184 lb (83.5 kg)  09/14/21 186 lb 3.2 oz (84.5 kg)  08/25/19 190 lb (86.2 kg)    EKG tracing ordered today is personally reviewed and shows afib with RVR (V rates 130s),  RBBB, LPHB, nonspecific ST/ T changes  Assessment and Plan:  Longstanding persistent afib/ atrial flutter Chads2vasc score is 1.  He is on eliquis He has become progressively symptomatic.  We had a very long and frank conversation today. I worry that his mortality is likely increased give nonadherent to therapy.  He is tearful and describes similar financial challenges that he discussed on prior visits.  I will increase diltiazem CD to 240mg  BID today. I have reinforced the importance of echo to further guide our management plan. I have advised consideration of tikosyn load/ ablation.  This would require [facility] (which he has previously declined).  For now, he would like to try increased rate control with close follow-up. I will therefore have him follow-up in 2 weeks in the AF clinic.  May  need to consider addition of metoprolol if V rates remain elevated then.  2. HTN Stable No change required today  3. Syncope No recurrence since 2020 event  4. Snoring Declines sleep study  Close outpatient follow-up is advised AF clinic visit in 2 weeks.  Ok to return to work once V rates are better controlled (Hopefully at that visit).  I have only advised work restraint for 2 weeks.  Thompson Grayer MD, [facility] 01/26/2022 10:44 AM

## 2022-02-07 ENCOUNTER — Other Ambulatory Visit: Payer: Self-pay

## 2022-02-07 ENCOUNTER — Ambulatory Visit (INDEPENDENT_AMBULATORY_CARE_PROVIDER_SITE_OTHER): Payer: BC Managed Care – PPO

## 2022-02-07 DIAGNOSIS — I4819 Other persistent atrial fibrillation: Secondary | ICD-10-CM

## 2022-02-07 DIAGNOSIS — I10 Essential (primary) hypertension: Secondary | ICD-10-CM

## 2022-02-07 LAB — ECHOCARDIOGRAM COMPLETE
AR max vel: 2.78 cm2
AV Area VTI: 2.43 cm2
AV Area mean vel: 2.45 cm2
AV Mean grad: 3 mmHg
AV Peak grad: 5.2 mmHg
Ao pk vel: 1.14 m/s
Area-P 1/2: 2.73 cm2
S' Lateral: 5.42 cm
Single Plane A4C EF: 60.4 %

## 2022-02-08 ENCOUNTER — Other Ambulatory Visit (HOSPITAL_BASED_OUTPATIENT_CLINIC_OR_DEPARTMENT_OTHER): Payer: BC Managed Care – PPO

## 2022-02-08 ENCOUNTER — Ambulatory Visit (HOSPITAL_COMMUNITY): Payer: BC Managed Care – PPO | Admitting: Physician Assistant

## 2022-02-09 ENCOUNTER — Other Ambulatory Visit: Payer: Self-pay

## 2022-02-09 ENCOUNTER — Encounter (HOSPITAL_BASED_OUTPATIENT_CLINIC_OR_DEPARTMENT_OTHER): Payer: Self-pay | Admitting: Internal Medicine

## 2022-02-09 ENCOUNTER — Ambulatory Visit (HOSPITAL_BASED_OUTPATIENT_CLINIC_OR_DEPARTMENT_OTHER): Payer: BC Managed Care – PPO | Admitting: Internal Medicine

## 2022-02-09 VITALS — BP 114/62 | HR 69 | Ht 68.0 in | Wt 190.0 lb

## 2022-02-09 DIAGNOSIS — I4819 Other persistent atrial fibrillation: Secondary | ICD-10-CM | POA: Diagnosis not present

## 2022-02-09 DIAGNOSIS — I10 Essential (primary) hypertension: Secondary | ICD-10-CM | POA: Diagnosis not present

## 2022-02-09 MED ORDER — APIXABAN 5 MG PO TABS: 5.0000 mg | ORAL_TABLET | Freq: Two times a day (BID) | ORAL | 11 refills | Status: DC

## 2022-02-09 MED ORDER — AMIODARONE HCL 200 MG PO TABS: 200.0000 mg | ORAL_TABLET | Freq: Two times a day (BID) | ORAL | 1 refills | Status: DC

## 2022-02-09 NOTE — Progress Notes (Signed)
? ? ?PCP: Tally Joe, MD ?  ?Primary EP: Dr Johney Frame ? ?Donald Morales is a 66 y.o. male who presents today for routine electrophysiology followup.  Since last being seen in our clinic, the patient reports doing better.  He has some fatigue and SOB.  Today, he denies symptoms of palpitations, chest pain,  lower extremity edema, dizziness, presyncope, or syncope.  The patient is otherwise without complaint today.  ? ?Past Medical History:  ?Diagnosis Date  ? BPH (benign prostatic hyperplasia)   ? History of epistaxis   ? Hyperlipidemia   ? Muscle ache   ? Persistent atrial fibrillation (HCC)   ? Premature ventricular contraction   ? RBBB   ? RBBB   ? Thyroid disease   ? Typical atrial flutter (HCC)   ? ?Past Surgical History:  ?Procedure Laterality Date  ? BACK SURGERY    ? ORCHIECTOMY    ? TOOTH EXTRACTION    ? ? ?ROS- all systems are reviewed and negatives except as per HPI above ? ?Current Outpatient Medications  ?Medication Sig Dispense Refill  ? diltiazem (CARDIZEM CD) 240 MG 24 hr capsule Take 1 capsule (240 mg total) by mouth in the morning and at bedtime. 180 capsule 3  ? levothyroxine (SYNTHROID) 88 MCG tablet Take 88 mcg by mouth daily before breakfast.    ? ?No current facility-administered medications for this visit.  ? ? ?Physical Exam: ?Vitals:  ? 02/09/22 1515  ?BP: 114/62  ?Pulse: 69  ?SpO2: 94%  ?Weight: 190 lb (86.2 kg)  ?Height: 5\' 8"  (1.727 m)  ? ? ?GEN- The patient is well appearing, alert and oriented x 3 today.   ?Head- normocephalic, atraumatic ?Eyes-  Sclera clear, conjunctiva pink ?Ears- hearing intact ?Oropharynx- clear ?Lungs- normal work of breathing ?Heart- iRRR ?GI- soft  ?Extremities- no clubbing, cyanosis, or edema ? ?Wt Readings from Last 3 Encounters:  ?02/09/22 190 lb (86.2 kg)  ?01/26/22 184 lb (83.5 kg)  ?09/14/21 186 lb 3.2 oz (84.5 kg)  ? ?Echo 02/07/22- EF 35%, moderate LV dysfunction, severe LA enlargement, moderate RA enlargement ? ?EKG tracing ordered today is personally  reviewed and shows afib, RBBB, LPHB ? ?Assessment and Plan: ? ?Longstanding persistent afib/ atrial flutter ?Chads2vasc score is 1.    ?Therapy has been very limited due to his adherence related to finances.  Given reduced EF, we should pursue sinus.  Given severe LA enlargement, I would advise medical therapy prior to consideration of ablation. ?I will start eliquis today.  Importance of compliance is advised. ?We discussed options of tikosyn, amiodarone and ablation at length.  He is very clear that he does not wish to be in the hospital for 3 days for tikosyn.  He is also clear that he declines ablation.  We discussed risks of amiodarone extensively including but not limited to risks of eye, liver, lung and thyroid toxicity.  He is aware of potential lung toxicity which could rarely be fatal.  He wishes to proceed. ?Once he has been on eliquis x 4 weeks, we will start amiodarone 200mg  BID.  We will have him return in 2 months with plans for possible [facility] if still in AF at that time. ? ?2. HTN ?Stable ?No change required today ? ?3. Syncope ?None since 2020 ? ?4. Snoring ?Declines sleep study ? ?5. Cardiomyopathy ?EF reduced by recent echo (Reviewed with him today).  I suspect that this is largely tachycardia mediated.  We will address AF.  If EF remains depressed  then we will need to further risk stratify. ? ? ?Risks, benefits and potential toxicities for medications prescribed and/or refilled reviewed with patient today.  ? ?Return to see me in about 8 weeks ? ?Hillis Range MD, FACC ?02/09/2022 ?3:19 PM ? ? ? ? ?

## 2022-02-09 NOTE — Patient Instructions (Addendum)
Medication Instructions:  ?Your physician has recommended you make the following change in your medication:  ? ? START taking Eliquis 5 mg-  Take one tablet by mouth twice a day ? ?2.  AFTER you have been on Eliquis for 4 weeks you will start amiodarone. ? A.  Amiodarone 200 mg-  Take one tablet by mouth TWICE a day ? ? ?Labwork: ?None ordered. ? ?Testing/Procedures: ?None ordered. ? ?Follow-Up: ?Your physician wants you to follow-up in: ?8 weeks with Dr. Rayann Heman. ? ?Any Other Special Instructions Will Be Listed Below (If Applicable). ? ?If you need a refill on your cardiac medications before your next appointment, please call your pharmacy.  ? ?Amiodarone Tablets ?What is this medication? ?AMIODARONE (a MEE oh da rone) prevents and treats a fast or irregular heartbeat (arrhythmia). It works by slowing down overactive electric signals in the heart, which stabilizes your heart rhythm. It belongs to a group of medications called antiarrhythmics. ?This medicine may be used for other purposes; ask your health care provider or pharmacist if you have questions. ?COMMON BRAND NAME(S): Cordarone, Pacerone ?What should I tell my care team before I take this medication? ?They need to know if you have any of these conditions: ?Liver disease ?Lung disease ?Other heart problems ?Thyroid disease ?An unusual or allergic reaction to amiodarone, iodine, other medications, foods, dyes, or preservatives ?Pregnant or trying to get pregnant ?Breast-feeding ?How should I use this medication? ?Take this medication by mouth with a glass of water. Follow the directions on the prescription label. You can take this medication with or without food. However, you should always take it the same way each time. Take your doses at regular intervals. Do not take your medication more often than directed. Do not stop taking except on the advice of your care team. ?A special MedGuide will be given to you by the pharmacist with each prescription and  refill. Be sure to read this information carefully each time. ?Talk to your care team regarding the use of this medication in children. Special care may be needed. ?Overdosage: If you think you have taken too much of this medicine contact a poison control center or emergency room at once. ?NOTE: This medicine is only for you. Do not share this medicine with others. ?What if I miss a dose? ?If you miss a dose, take it as soon as you can. If it is almost time for your next dose, take only that dose. Do not take double or extra doses. ?What may interact with this medication? ?Do not take this medication with any of the following: ?Abarelix ?Apomorphine ?Arsenic trioxide ?Certain antibiotics like erythromycin, gemifloxacin, levofloxacin, pentamidine ?Certain medications for depression like amoxapine, tricyclic antidepressants ?Certain medications for fungal infections like fluconazole, itraconazole, ketoconazole, posaconazole, voriconazole ?Certain medications for irregular heartbeat like disopyramide, dronedarone, ibutilide, propafenone, sotalol ?Certain medications for malaria like chloroquine, halofantrine ?Cisapride ?Droperidol ?Haloperidol ?[place] ?Maprotiline ?Methadone ?Phenothiazines like chlorpromazine, mesoridazine, thioridazine ?Pimozide ?Ranolazine ?Red yeast rice ?Vardenafil ?This medication may also interact with the following: ?Antiviral medications for HIV or AIDS ?Certain medications for blood pressure, heart disease, irregular heart beat ?Certain medications for cholesterol like atorvastatin, cerivastatin, lovastatin, simvastatin ?Certain medications for hepatitis C like sofosbuvir and ledipasvir; sofosbuvir ?Certain medications for seizures like phenytoin ?Certain medications for thyroid problems ?Certain medications that treat or prevent blood clots like warfarin ?Cholestyramine ?Cimetidine ?Clopidogrel ?Cyclosporine ?Dextromethorphan ?Diuretics ?Dofetilide ?Fentanyl ?General  anesthetics ?Grapefruit juice ?Lidocaine ?Loratadine ?Methotrexate ?Other medications that prolong the QT interval (cause an abnormal heart  rhythm) ?Procainamide ?Quinidine ?Rifabutin, rifampin, or rifapentine ?[place] ?Trazodone ?Ziprasidone ?This list may not describe all possible interactions. Give your health care provider a list of all the medicines, herbs, non-prescription drugs, or dietary supplements you use. Also tell them if you smoke, drink alcohol, or use illegal drugs. Some items may interact with your medicine. ?What should I watch for while using this medication? ?Your condition will be monitored closely when you first begin therapy. Often, this medication is first started in a hospital or other monitored health care setting. Once you are on maintenance therapy, visit your care team for regular checks on your progress. Because your condition and use of this medication carry some risk, it is a good idea to carry an identification card, necklace or bracelet with details of your condition, medications, and care team. ?You may get drowsy or dizzy. Do not drive, use machinery, or do anything that needs mental alertness until you know how this medication affects you. Do not stand or sit up quickly, especially if you are an older patient. This reduces the risk of dizzy or fainting spells. ?This medication can make you more sensitive to the sun. Keep out of the sun. If you cannot avoid being in the sun, wear protective clothing and use sunscreen. Do not use sun lamps or tanning beds/booths. ?You should have regular eye exams before and during treatment. Call your care team if you have blurred vision, see halos, or your eyes become sensitive to light. Your eyes may get dry. It may be helpful to use a lubricating eye solution or artificial tears solution. ?If you are going to have surgery or a procedure that requires contrast dyes, tell your care team that you are taking this medication. ?What side  effects may I notice from receiving this medication? ?Side effects that you should report to your care team as soon as possible: ?Allergic reactions--skin rash, itching, hives, swelling of the face, lips, tongue, or throat ?Bluish-gray skin ?Change in vision such as blurry vision, seeing halos around lights, vision loss ?Heart failure--shortness of breath, swelling of the ankles, feet, or hands, sudden weight gain, unusual weakness or fatigue ?Heart rhythm changes--fast or irregular heartbeat, dizziness, feeling faint or lightheaded, chest pain, trouble breathing ?High thyroid levels (hyperthyroidism)--fast or irregular heartbeat, weight loss, excessive sweating or sensitivity to heat, tremors or shaking, anxiety, nervousness, irregular menstrual cycle or spotting ?Liver injury--right upper belly pain, loss of appetite, nausea, light-colored stool, dark yellow or brown urine, yellowing skin or eyes, unusual weakness or fatigue ?Low thyroid levels (hypothyroidism)--unusual weakness or fatigue, sensitivity to cold, constipation, hair loss, dry skin, weight gain, feelings of depression ?Lung injury--shortness of breath or trouble breathing, cough, spitting up blood, chest pain, fever ?Pain, tingling, or numbness in the hands or feet, muscle weakness, trouble walking, loss of balance or coordination ?Side effects that usually do not require medical attention (report to your care team if they continue or are bothersome): ?Nausea ?Vomiting ?This list may not describe all possible side effects. Call your doctor for medical advice about side effects. You may report side effects to FDA at 1-800-FDA-1088. ?Where should I keep my medication? ?Keep out of the reach of children and pets. ?Store at room temperature between 20 and 25 degrees C (68 and 77 degrees F). Protect from light. Keep container tightly closed. Throw away any unused medication after the expiration date. ?NOTE: This sheet is a summary. It may not cover all  possible information. If you have questions about  this medicine, talk to your doctor, pharmacist, or health care provider. ?? 2022 Elsevier/Gold Standard (2021-01-14 00:00:00) ? ? ? ?

## 2022-02-13 NOTE — Addendum Note (Signed)
Addended by: Keveon Amsler, Beau Vanduzer M on: 02/13/2022 08:16 AM ? ? Modules accepted: Orders ? ?

## 2022-03-03 ENCOUNTER — Encounter (HOSPITAL_BASED_OUTPATIENT_CLINIC_OR_DEPARTMENT_OTHER): Payer: Self-pay | Admitting: Internal Medicine

## 2022-03-03 NOTE — Telephone Encounter (Signed)
Dr. Johney Frame patient  ?

## 2022-03-09 ENCOUNTER — Ambulatory Visit: Payer: BC Managed Care – PPO | Admitting: Internal Medicine

## 2022-04-13 ENCOUNTER — Ambulatory Visit (HOSPITAL_BASED_OUTPATIENT_CLINIC_OR_DEPARTMENT_OTHER): Payer: BC Managed Care – PPO | Admitting: Internal Medicine

## 2022-04-13 ENCOUNTER — Encounter (HOSPITAL_BASED_OUTPATIENT_CLINIC_OR_DEPARTMENT_OTHER): Payer: Self-pay | Admitting: Internal Medicine

## 2022-04-13 VITALS — BP 122/74 | HR 68 | Ht 70.0 in | Wt 189.2 lb

## 2022-04-13 DIAGNOSIS — I10 Essential (primary) hypertension: Secondary | ICD-10-CM

## 2022-04-13 DIAGNOSIS — I4819 Other persistent atrial fibrillation: Secondary | ICD-10-CM | POA: Diagnosis not present

## 2022-04-13 NOTE — Progress Notes (Signed)
? ? ?PCP: Antony Contras, MD ?  ?Primary EP: Dr Rayann Heman ? ?Donald Morales is a 66 y.o. male who presents today for routine electrophysiology followup.  Since last being seen in our clinic, the patient reports doing reasonably well.  He has occasional leg swelling.  Fatigue and SOB are better.  Tolerating eliquis and amiodarone without issues.  Today, he denies symptoms of palpitations, chest pain,  dizziness, presyncope, or syncope.  The patient is otherwise without complaint today.  ? ?Past Medical History:  ?Diagnosis Date  ? BPH (benign prostatic hyperplasia)   ? History of epistaxis   ? Hyperlipidemia   ? Muscle ache   ? Persistent atrial fibrillation ([place])   ? Premature ventricular contraction   ? RBBB   ? RBBB   ? Thyroid disease   ? Typical atrial flutter ([place])   ? ?Past Surgical History:  ?Procedure Laterality Date  ? BACK SURGERY    ? ORCHIECTOMY    ? TOOTH EXTRACTION    ? ? ?ROS- all systems are reviewed and negatives except as per HPI above ? ?Current Outpatient Medications  ?Medication Sig Dispense Refill  ? amiodarone (PACERONE) 200 MG tablet Take 1 tablet (200 mg total) by mouth 2 (two) times daily. 180 tablet 1  ? apixaban (ELIQUIS) 5 MG TABS tablet Take 1 tablet (5 mg total) by mouth 2 (two) times daily. 60 tablet 11  ? diltiazem (CARDIZEM CD) 240 MG 24 hr capsule Take 1 capsule (240 mg total) by mouth in the morning and at bedtime. 180 capsule 3  ? levothyroxine (SYNTHROID) 125 MCG tablet TAKE 1 TABLET BY MOUTH EVERY DAY IN THE MORNING ON EMPTY STOMACH FOR 30 DAYS    ? ?No current facility-administered medications for this visit.  ? ? ?Physical Exam: ?Vitals:  ? 04/13/22 1545  ?BP: 122/74  ?Pulse: 68  ?Weight: 189 lb 3.2 oz (85.8 kg)  ?Height: 5\' 10"  (1.778 m)  ? ? ?GEN- The patient is well appearing, alert and oriented x 3 today.   ?Head- normocephalic, atraumatic ?Eyes-  Sclera clear, conjunctiva pink ?Ears- hearing intact ?Oropharynx- clear ?Lungs- Clear to ausculation bilaterally, normal work of  breathing ?Heart- iRRR ?GI- soft, NT, ND, + BS ?Extremities- no clubbing, cyanosis, trace edema ? ?Wt Readings from Last 3 Encounters:  ?04/13/22 189 lb 3.2 oz (85.8 kg)  ?02/09/22 190 lb (86.2 kg)  ?01/26/22 184 lb (83.5 kg)  ? ? ?EKG tracing ordered today is personally reviewed and shows afib, V rate 68 bpm, RBBB, LPHB ? ?Assessment and Plan: ? ?Longstanding persistent afib/ atrial flutter ?He has longstanding atrial arrhythmias in the setting of severe LA enlargement.  He has not regularly followed up for care and has not pursued a more aggressive strategy due to financial concerns.  We have discussed this at multiple visits at length.  I worry about his long term prognosis.  He has declined my recommendation for tikosyn or ablation.  He opted for amiodarone, recognizing inherent risks on his last visit.  He was started on eliquis and subsequently amiodarone.  The next step is cardioversion.  Though he is reluctant to comply, I have very clearly articulated the importance with re-establishing sinus rhythm with his likely tachycardia mediated CM. ?He is willing to proceed.  We will therefore schedule cardioversion over the next few weeks.  Risks of the procedure were discussed including risks of stroke and risks of anesthesia.  He will have ekg 1-2 days prior to [facility] to make sure he  has not converted to sinus.  I will see him in June with plans of reducing amiodarone to 200mg  daily at that time. ? ?2. HTN ?Stable ?No change required today ? ?3. Snoring ?Declines sleep study ? ?4. 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 ?Will pursue sinus (as above).  If he does not convert to sinus, additional testing will be required. ? ?Risks, benefits and potential toxicities for medications prescribed and/or refilled reviewed with patient today.  ? ?Return to see me several weeks after [facility] ? ?Very complicated patient.  A high level of decision making was required for the encounter today. ? ?Thompson Grayer MD,  FACC ?04/13/2022 ?3:55 PM ? ? ? ? ?

## 2022-04-13 NOTE — Patient Instructions (Signed)
Medication Instructions:  ?Your physician recommends that you continue on your current medications as directed. Please refer to the Current Medication list given to you today. ?*If you need a refill on your cardiac medications before your next appointment, please call your pharmacy* ? ?Lab Work: ?None ordered. ?If you have labs (blood work) drawn today and your tests are completely normal, you will receive your results only by: ?MyChart Message (if you have MyChart) OR ?A paper copy in the mail ?If you have any lab test that is abnormal or we need to change your treatment, we will call you to review the results. ? ?Testing/Procedures: ?Your physician has recommended that you have a Cardioversion (DCCV). Electrical Cardioversion uses a jolt of electricity to your heart either through paddles or wired patches attached to your chest. This is a controlled, usually prescheduled, procedure. Defibrillation is done under light anesthesia in the hospital, and you usually go home the day of the procedure. This is done to get your heart back into a normal rhythm. You are not awake for the procedure. Please see the instruction sheet given to you today. ? ?Follow-Up: ?At [facility], you and your health needs are our priority.  As part of our continuing mission to provide you with exceptional heart care, we have created designated Provider Care Teams.  These Care Teams include your primary Cardiologist (physician) and Advanced Practice Providers (APPs -  Physician Assistants and Nurse Practitioners) who all work together to provide you with the care you need, when you need it. ? ?Your next appointment:   ? ?You will have a nurse visit 2 days prior to your cardioversion.  Please schedule nurse visit for May 17, 2022. ? ?You will see Dr. Johney Frame on May 29, 2022.  Please schedule. ? ?Important Information About Sugar ? ? ? ? ? ? ? ?You are scheduled for a Cardioversion on May 19, 2022 with Dr. Cristal Deer.  Please arrive at  the [facility] (Main Entrance A) at [facility]: [address] [place], [place] [address] at 6:30 am.  ? ?DIET: Nothing to eat or drink after midnight except a sip of water with medications (see medication instructions below) ? ?FYI: For your safety, and to allow [place] to monitor your vital signs accurately during the surgery/procedure we request that   ?if you have artificial nails, gel coating, SNS etc. Please have those removed prior to your surgery/procedure. Not having the nail coverings /polish removed may result in cancellation or delay of your surgery/procedure. ? ? ?Medication Instructions: ?TAKE ALL AM MEDICATIONS WITH A SIP OF WATER ? ?You will need to continue your anticoagulant after your procedure until you  are told by your  ?Provider that it is safe to stop ? ? ?Labs:  AFTER nurse visit if applicable ? ?You must have a responsible person to drive you home and stay in the waiting area during your procedure. Failure to do so could result in cancellation. ? ?Physiological scientist cards. ? ?*Special Note: Every effort is made to have your procedure done on time. Occasionally there are emergencies that occur at the hospital that may cause delays. Please be patient if a delay does occur.  ? ? ? ?

## 2022-04-14 NOTE — Addendum Note (Signed)
Addended by: Barrie Dunker on: 04/14/2022 09:10 AM ? ? Modules accepted: Orders ? ?

## 2022-05-10 ENCOUNTER — Encounter (HOSPITAL_BASED_OUTPATIENT_CLINIC_OR_DEPARTMENT_OTHER): Payer: Self-pay | Admitting: Internal Medicine

## 2022-05-10 ENCOUNTER — Encounter (HOSPITAL_COMMUNITY): Payer: Self-pay | Admitting: Cardiology

## 2022-05-10 NOTE — Progress Notes (Signed)
Attempted to obtain medical history via telephone, unable to reach at this time. HIPAA compliant voicemail message left requesting return call to pre surgical testing department. 

## 2022-05-15 ENCOUNTER — Telehealth (HOSPITAL_BASED_OUTPATIENT_CLINIC_OR_DEPARTMENT_OTHER): Payer: Self-pay | Admitting: Internal Medicine

## 2022-05-15 NOTE — Telephone Encounter (Signed)
Called patient to confirm nurse visit for 6/14 and patient said he wants to cancel cardioversion. Patient said he is uncomfortable with it right now and wants to wait a month or so to reschedule this.

## 2022-05-16 NOTE — Telephone Encounter (Signed)
Cancelled cardioversion.  Pt cancelled all of his scheduled follow up's.  Sent mychart message to Pt advising to notify this nurse when he would like to reschedule his DCCV.

## 2022-05-17 ENCOUNTER — Ambulatory Visit (HOSPITAL_BASED_OUTPATIENT_CLINIC_OR_DEPARTMENT_OTHER): Payer: BC Managed Care – PPO

## 2022-05-18 ENCOUNTER — Encounter (HOSPITAL_BASED_OUTPATIENT_CLINIC_OR_DEPARTMENT_OTHER): Payer: Self-pay

## 2022-05-18 NOTE — Telephone Encounter (Signed)
Opened in error

## 2022-05-19 ENCOUNTER — Ambulatory Visit (HOSPITAL_COMMUNITY): Admission: RE | Admit: 2022-05-19 | Payer: BC Managed Care – PPO | Source: Ambulatory Visit | Admitting: Cardiology

## 2022-05-19 DIAGNOSIS — I4819 Other persistent atrial fibrillation: Secondary | ICD-10-CM

## 2022-05-19 SURGERY — CARDIOVERSION
Anesthesia: General

## 2022-05-29 ENCOUNTER — Ambulatory Visit (HOSPITAL_BASED_OUTPATIENT_CLINIC_OR_DEPARTMENT_OTHER): Payer: BC Managed Care – PPO | Admitting: Internal Medicine

## 2022-08-10 ENCOUNTER — Other Ambulatory Visit (HOSPITAL_BASED_OUTPATIENT_CLINIC_OR_DEPARTMENT_OTHER): Payer: Self-pay | Admitting: Internal Medicine

## 2022-09-01 ENCOUNTER — Other Ambulatory Visit: Payer: Self-pay | Admitting: Surgery

## 2022-09-01 DIAGNOSIS — R1031 Right lower quadrant pain: Secondary | ICD-10-CM

## 2022-09-26 ENCOUNTER — Ambulatory Visit
Admission: RE | Admit: 2022-09-26 | Discharge: 2022-09-26 | Disposition: A | Discharge status: Home / Self Care | Payer: BC Managed Care – PPO | Source: Ambulatory Visit | Attending: Surgery | Admitting: Surgery

## 2022-09-26 DIAGNOSIS — R1031 Right lower quadrant pain: Secondary | ICD-10-CM

## 2022-09-28 ENCOUNTER — Telehealth: Payer: Self-pay | Admitting: *Deleted

## 2022-09-28 NOTE — Telephone Encounter (Signed)
   Name: Donald Morales  DOB: July 23, 1956  MRN: [id number]  Primary Cardiologist: None  Chart reviewed as part of pre-operative protocol coverage. Because of RUFFIN LADA past medical history and time since last visit, he will require a follow-up in-office visit in order to better assess preoperative cardiovascular risk. Preferably to see EP but could also see general cardiology.   Last seen 04/13/22 and set up for cardioversion which he cancelled and has not been seen since that time.   Pre-op covering staff: - Please schedule appointment and call patient to inform them. If patient already had an upcoming appointment within acceptable timeframe, please add "pre-op clearance" to the appointment notes so provider is aware. - Please contact requesting surgeon's office via preferred method (i.e, phone, fax) to inform them of need for appointment prior to surgery.  This message will also be routed to pharmacy pool for input on holding Eliquis as requested below so that this information is available to the clearing provider at time of patient's appointment.  _____________  Patient on Eliquis for atrial fibrillation.   CHA2DS2-VASc Score = 3   This indicates a 3.2% annual risk of stroke. The patient's score is based upon: CHF History: 1 HTN History: 1 Diabetes History: 0 Stroke History: 0 Vascular Disease History: 1 (09/2022 CT with aortic atherosclerosis) Age Score: 0 Gender Score: 0    Platelet count: Not available on [facility] - will request most recent labs from PCP as anticipate it is on the 08/31/22 panel and just not available in [place]. If labs not received, patient will need updated labs.   Hemoglobin: 08/31/22 10.2 per [facility]  Creatinine: 07/21/22 1.17 per [facility]  Creatinine clearance: 70 mL/min (adjusted for body weight)   Loel Dubonnet, NP  09/28/22  3:27 PM

## 2022-09-28 NOTE — Telephone Encounter (Signed)
   Pre-operative Risk Assessment    Patient Name: Donald Morales  DOB: 01-22-1956 MRN: [id number]      Request for Surgical Clearance    Procedure:   ENDOSCOPY/COLONOSCOPY  Date of Surgery:  Clearance 11/08/22                                 Surgeon:  DR. Shary Key Surgeon's Group or Practice Name:  [place] Phone number:  [phone]; ATTN: [place], [place] Fax number:  [phone]    Type of Clearance Requested:   - Medical  - Pharmacy:  Hold Apixaban (Eliquis) x 2 DAYS PRIOR   Type of Anesthesia:   PROPOFOL   Additional requests/questions:    [name]   09/28/2022, 3:11 PM

## 2022-09-28 NOTE — Telephone Encounter (Signed)
Will send a message to [place], [place] scheduler to arrange in office appt with EP APP for pre op clearance. I will see if PCP has recent platelet count.

## 2022-09-29 NOTE — Telephone Encounter (Signed)
I s/w PCP office and they have a recent CBC from 08/31/22 that they will fax over today to [phone] attn: pre op team .Once I have the results I will update the pre op provider and the pre op pharm-d as well. Will have labs scanned in to the pt's chart.

## 2022-09-29 NOTE — Telephone Encounter (Signed)
Patient with diagnosis of Afib on Eliquis for anticoagulation.    Procedure: ENDOSCOPY/COLONOSCOPY Date of procedure: 11/08/22   CHA2DS2-VASc Score = 3   This indicates a 3.2% annual risk of stroke. The patient's score is based upon: CHF History: 1 HTN History: 1 Diabetes History: 0 Stroke History: 0 Vascular Disease History: 1 (09/2022 CT with aortic atherosclerosis) Age Score: 0 Gender Score: 0      CrCl 70 ml/min  Per office protocol, patient can hold Eliquis for 2 days prior to procedure.     **This guidance is not considered finalized until pre-operative APP has relayed final recommendations.**

## 2022-09-29 NOTE — Telephone Encounter (Signed)
I tried to call the pt to schedule an appt with EP APP for pre op clearance. Vm is full, could not leave message to call back for appt in office with EP APP for pre op clearance.

## 2022-09-29 NOTE — Telephone Encounter (Signed)
Pt has appt 10/17/22 @ 3:30. I will update all parties involved.

## 2022-09-29 NOTE — Telephone Encounter (Signed)
LABS FOR PRE OP CLEARANCE NEEDED:   PCP sent over recent CBC 08/31/22.   WBC 6.6 RBC 4.22 HGB 10.2 HCT 31.2 PLT 298

## 2022-10-17 ENCOUNTER — Encounter: Payer: Self-pay | Admitting: Cardiovascular Disease

## 2022-10-17 ENCOUNTER — Ambulatory Visit: Payer: BC Managed Care – PPO | Attending: Cardiovascular Disease | Admitting: Cardiovascular Disease

## 2022-10-17 VITALS — BP 132/80 | HR 63 | Ht 70.0 in | Wt 193.2 lb

## 2022-10-17 DIAGNOSIS — I4819 Other persistent atrial fibrillation: Secondary | ICD-10-CM

## 2022-10-17 DIAGNOSIS — Z01810 Encounter for preprocedural cardiovascular examination: Secondary | ICD-10-CM | POA: Diagnosis not present

## 2022-10-17 DIAGNOSIS — I452 Bifascicular block: Secondary | ICD-10-CM | POA: Diagnosis not present

## 2022-10-17 NOTE — Progress Notes (Signed)
PCP: Tally Joe, MD   Primary EP: Dr Shaune Pascal is a 66 y.o. male who presents today for routine electrophysiology followup.  Since last being seen in our clinic, the patient reports doing reasonably well.  He has occasional leg swelling.  Fatigue and SOB are better.  Tolerating eliquis and amiodarone without issues.  Today, he denies symptoms of palpitations, chest pain,  dizziness, presyncope, or syncope.  The patient is otherwise without complaint today.   At his last visit in EP clinic with Dr. Johney Frame, he agreed to have a DC cardioversion, but he canceled the procedure.  There is a preoperative clearance request prior to colonoscopy/endoscopy.   He is in sinus rhythm today. He reports that he has been in sinus rhythm for months now. He was noted to be anemic and is scheduled for upper and lower endoscopies.  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 He can walk up two flights of steps without much difficulty.   Past Medical History:  Diagnosis Date   BPH (benign prostatic hyperplasia)    History of epistaxis    Hyperlipidemia    Muscle ache    Persistent atrial fibrillation (HCC)    Premature ventricular contraction    RBBB    RBBB    Thyroid disease    Typical atrial flutter (HCC)    Past Surgical History:  Procedure Laterality Date   BACK SURGERY     ORCHIECTOMY     TOOTH EXTRACTION      ROS- all systems are reviewed and negatives except as per HPI above  Current Outpatient Medications  Medication Sig Dispense Refill   acetaminophen (TYLENOL) 500 MG tablet Take 1,000 mg by mouth every 6 (six) hours as needed for moderate pain.     amiodarone (PACERONE) 200 MG tablet Take 1 tablet (200 mg total) by mouth 2 (two) times daily. Please call [phone] to schedule an overdue appointment for future refills. Thank you. 1st attempt. 60 tablet 0   apixaban (ELIQUIS) 5 MG TABS tablet Take 1 tablet (5 mg total) by mouth 2  (two) times daily. 60 tablet 11   Ascorbic Acid (VITAMIN C PO) Take 2 tablets by mouth daily as needed (immune support).     diltiazem (CARDIZEM CD) 240 MG 24 hr capsule Take 1 capsule (240 mg total) by mouth in the morning and at bedtime. 180 capsule 3   levothyroxine (SYNTHROID) 125 MCG tablet TAKE 1 TABLET BY MOUTH EVERY DAY IN THE MORNING ON EMPTY STOMACH FOR 30 DAYS     omeprazole (PRILOSEC OTC) 20 MG tablet Take 20 mg by mouth daily as needed (acid reflux).     No current facility-administered medications for this visit.    Physical Exam: There were no vitals filed for this visit.   GEN- The patient is well appearing, alert and oriented x 3 today.   Head- normocephalic, atraumatic Eyes-  Sclera clear, conjunctiva pink Ears- hearing intact Oropharynx- clear Lungs- Clear to ausculation bilaterally, normal work of breathing Heart- iRRR GI- soft, NT, ND, + BS Extremities- no clubbing, cyanosis, trace edema  Wt Readings from Last 3 Encounters:  04/13/22 189 lb 3.2 oz (85.8 kg)  02/09/22 190 lb (86.2 kg)  01/26/22 184 lb (83.5 kg)     Assessment and Plan:  Longstanding persistent afib/ atrial flutter He has had longstanding atrial arrhythmias in the setting of severe LA enlargement.  Per Dr. Jenel Lucks prior note, he did  not regularly follow up for care.  He declined Dr. Jenel Lucks recommendations for intervention, eventually agreed to a DCCV but canceled it. He quit taking his eliquis -- reasonably so since he has had anemia.   2. Anemia - he is scheduled for  upper and lower endoscopy. He is in sinus today and has discontinued eliquis. I think he is at most a moderate risk patient for a low risk procedure. If his EF has recovered, he would be a low risk patient, however he declines to have a TTE  3. HTN Stable No change required today  4. Snoring Declines sleep study  5. Cardiomyopathy Likely tachycardia mediated. EF 35-40% in 2023. I strongly urged him to have a repeat  TTE to assess if his LVEF has recovered, but he declines   Maurice Small, MD  10/17/2022 3:29 PM

## 2022-10-24 ENCOUNTER — Telehealth: Payer: Self-pay

## 2022-10-24 NOTE — Telephone Encounter (Signed)
Ok to use same anticoag clearance rec from 09/28/22 clearance note for this procedure with 2 day Eliquis hold, although per 10/17/22 note it looks like pt has already stopped taking his Eliquis.

## 2022-10-24 NOTE — Telephone Encounter (Signed)
     Primary Cardiologist: None  Chart reviewed as part of pre-operative protocol coverage. Given past medical history and time since last visit, based on ACC/AHA guidelines, Donald Morales would be at acceptable risk for the planned procedure without further cardiovascular testing.   CHA2DS2-VASc Score = 3   This indicates a 3.2% annual risk of stroke. The patient's score is based upon: CHF History: 1 HTN History: 1 Diabetes History: 0 Stroke History: 0 Vascular Disease History: 1 (09/2022 CT with aortic atherosclerosis) Age Score: 0 Gender Score: 0       CrCl 70 ml/min   Per office protocol, patient can hold Eliquis for 2 days prior to procedure.    I will route this recommendation to the requesting party via Epic fax function and remove from pre-op pool.  Please call with questions.  Thomasene Ripple. Berdella Bacot NP-C     10/24/2022, 1:38 PM [facility] [address] Office [phone] Fax [phone]

## 2022-10-24 NOTE — Telephone Encounter (Signed)
..     Pre-operative Risk Assessment    Patient Name: Donald Morales  DOB: May 15, 1956 MRN: [id number]      Request for Surgical Clearance    Procedure:   endoscopy/colonoscopy  Date of Surgery:  Clearance 11/08/22                                 Surgeon:  dr Yevonne Pax Surgeon's Group or Practice Name:  [facility] Phone number:  [phone] Fax number:  [phone]   Type of Clearance Requested:   - Medical  - Pharmacy:  Hold Apixaban (Eliquis) wants hold for 2 days   Type of Anesthesia:  Not Indicated   Additional requests/questions:    Jola Babinski   10/24/2022, 10:48 AM

## 2023-10-03 ENCOUNTER — Telehealth: Payer: Self-pay | Admitting: Cardiovascular Disease

## 2023-10-03 NOTE — Telephone Encounter (Signed)
Called pt to schedule appt from recall, he said he was not interested in scheduling.

## 2024-03-24 ENCOUNTER — Telehealth (HOSPITAL_BASED_OUTPATIENT_CLINIC_OR_DEPARTMENT_OTHER): Payer: Self-pay | Admitting: *Deleted

## 2024-03-24 NOTE — Telephone Encounter (Signed)
   Name: Donald Morales  DOB: February 23, 1956  MRN: [id number]  Primary Cardiologist: None  Chart reviewed as part of pre-operative protocol coverage. Because of [name] past medical history and time since last visit, he will require a follow-up in-office visit in order to better assess preoperative cardiovascular risk. Pt has not been seen since 2023.   Pre-op covering staff: - Please schedule appointment and call patient to inform them. If patient already had an upcoming appointment within acceptable timeframe, please add "pre-op clearance" to the appointment notes so provider is aware. - Please contact requesting surgeon's office via preferred method (i.e, phone, fax) to inform them of need for appointment prior to surgery.  This message will also be routed to pharmacy pool for input on holding Eliquis  as requested below so that this information is available to the clearing provider at time of patient's appointment.   Jude Norton, NP  03/24/2024, 3:43 PM

## 2024-03-24 NOTE — Telephone Encounter (Signed)
   Pre-operative Risk Assessment    Patient Name: Donald Morales  DOB: 04/13/1956 MRN: [id number]   Date of last office visit: 10/07/2022 Date of next office visit: None  Request for Surgical Clearance    Procedure:   Colonoscopy/endoscopy  Date of Surgery:  Clearance TBD                                 Surgeon:  Dr. Honey Lusty Surgeon's Group or Practice Name:  [facility] Phone number:  [phone] Fax number:  [phone]   Type of Clearance Requested:   - Medical  - Pharmacy:  Hold Apixaban  (Eliquis ) Not indicated.   Type of Anesthesia:  Not Indicated   Additional requests/questions:    Signed, Lauris Port   03/24/2024, 1:44 PM

## 2024-03-25 NOTE — Telephone Encounter (Signed)
 Patient with diagnosis of afib on Eliquis  for anticoagulation.    Procedure: Colonoscopy/endoscopy  Date of procedure: TBD   CHA2DS2-VASc Score = 2   This indicates a 2.2% annual risk of stroke. The patient's score is based upon: CHF History: 0 HTN History: 1 Diabetes History: 0 Stroke History: 0 Vascular Disease History: 0 Age Score: 1 Gender Score: 0      eGFR: 70 Platelet count 298  Per office protocol, patient can hold Eliquis  for 2 days prior to procedure.    **This guidance is not considered finalized until pre-operative APP has relayed final recommendations.**

## 2024-04-12 ENCOUNTER — Other Ambulatory Visit: Payer: Self-pay

## 2024-04-12 ENCOUNTER — Inpatient Hospital Stay (HOSPITAL_COMMUNITY)
Admission: EM | Admit: 2024-04-12 | Discharge: 2024-04-30 | DRG: 853 | Disposition: A | Discharge status: Home / Self Care | Payer: Self-pay | Attending: Internal Medicine | Admitting: Internal Medicine

## 2024-04-12 ENCOUNTER — Encounter (HOSPITAL_COMMUNITY): Payer: Self-pay | Admitting: Radiology

## 2024-04-12 ENCOUNTER — Emergency Department (HOSPITAL_COMMUNITY): Payer: Self-pay

## 2024-04-12 DIAGNOSIS — Z885 Allergy status to narcotic agent status: Secondary | ICD-10-CM

## 2024-04-12 DIAGNOSIS — J9601 Acute respiratory failure with hypoxia: Secondary | ICD-10-CM | POA: Diagnosis not present

## 2024-04-12 DIAGNOSIS — E876 Hypokalemia: Secondary | ICD-10-CM | POA: Diagnosis not present

## 2024-04-12 DIAGNOSIS — R7303 Prediabetes: Secondary | ICD-10-CM | POA: Diagnosis not present

## 2024-04-12 DIAGNOSIS — F05 Delirium due to known physiological condition: Secondary | ICD-10-CM | POA: Diagnosis not present

## 2024-04-12 DIAGNOSIS — R6521 Severe sepsis with septic shock: Secondary | ICD-10-CM | POA: Diagnosis present

## 2024-04-12 DIAGNOSIS — I4892 Unspecified atrial flutter: Secondary | ICD-10-CM | POA: Diagnosis present

## 2024-04-12 DIAGNOSIS — K55029 Acute infarction of small intestine, extent unspecified: Secondary | ICD-10-CM | POA: Diagnosis present

## 2024-04-12 DIAGNOSIS — N401 Enlarged prostate with lower urinary tract symptoms: Secondary | ICD-10-CM | POA: Diagnosis present

## 2024-04-12 DIAGNOSIS — K567 Ileus, unspecified: Secondary | ICD-10-CM | POA: Diagnosis not present

## 2024-04-12 DIAGNOSIS — G9341 Metabolic encephalopathy: Secondary | ICD-10-CM | POA: Diagnosis not present

## 2024-04-12 DIAGNOSIS — I951 Orthostatic hypotension: Secondary | ICD-10-CM | POA: Diagnosis present

## 2024-04-12 DIAGNOSIS — D696 Thrombocytopenia, unspecified: Secondary | ICD-10-CM | POA: Diagnosis not present

## 2024-04-12 DIAGNOSIS — Z7409 Other reduced mobility: Secondary | ICD-10-CM | POA: Diagnosis present

## 2024-04-12 DIAGNOSIS — I110 Hypertensive heart disease with heart failure: Secondary | ICD-10-CM | POA: Diagnosis present

## 2024-04-12 DIAGNOSIS — Z860100 Personal history of colon polyps, unspecified: Secondary | ICD-10-CM

## 2024-04-12 DIAGNOSIS — Z5331 Laparoscopic surgical procedure converted to open procedure: Secondary | ICD-10-CM

## 2024-04-12 DIAGNOSIS — R1030 Lower abdominal pain, unspecified: Secondary | ICD-10-CM | POA: Diagnosis not present

## 2024-04-12 DIAGNOSIS — Z8249 Family history of ischemic heart disease and other diseases of the circulatory system: Secondary | ICD-10-CM

## 2024-04-12 DIAGNOSIS — T502X5A Adverse effect of carbonic-anhydrase inhibitors, benzothiadiazides and other diuretics, initial encounter: Secondary | ICD-10-CM | POA: Diagnosis present

## 2024-04-12 DIAGNOSIS — Z79890 Hormone replacement therapy (postmenopausal): Secondary | ICD-10-CM

## 2024-04-12 DIAGNOSIS — Z79899 Other long term (current) drug therapy: Secondary | ICD-10-CM

## 2024-04-12 DIAGNOSIS — A419 Sepsis, unspecified organism: Principal | ICD-10-CM | POA: Diagnosis present

## 2024-04-12 DIAGNOSIS — R040 Epistaxis: Secondary | ICD-10-CM | POA: Diagnosis not present

## 2024-04-12 DIAGNOSIS — D62 Acute posthemorrhagic anemia: Secondary | ICD-10-CM

## 2024-04-12 DIAGNOSIS — E871 Hypo-osmolality and hyponatremia: Secondary | ICD-10-CM | POA: Diagnosis not present

## 2024-04-12 DIAGNOSIS — K559 Vascular disorder of intestine, unspecified: Secondary | ICD-10-CM | POA: Diagnosis present

## 2024-04-12 DIAGNOSIS — Z792 Long term (current) use of antibiotics: Secondary | ICD-10-CM

## 2024-04-12 DIAGNOSIS — I4510 Unspecified right bundle-branch block: Secondary | ICD-10-CM | POA: Diagnosis present

## 2024-04-12 DIAGNOSIS — I428 Other cardiomyopathies: Secondary | ICD-10-CM | POA: Diagnosis present

## 2024-04-12 DIAGNOSIS — Z7901 Long term (current) use of anticoagulants: Secondary | ICD-10-CM

## 2024-04-12 DIAGNOSIS — I4811 Longstanding persistent atrial fibrillation: Secondary | ICD-10-CM | POA: Diagnosis present

## 2024-04-12 DIAGNOSIS — T41295A Adverse effect of other general anesthetics, initial encounter: Secondary | ICD-10-CM | POA: Diagnosis not present

## 2024-04-12 DIAGNOSIS — R338 Other retention of urine: Secondary | ICD-10-CM | POA: Diagnosis present

## 2024-04-12 DIAGNOSIS — E872 Acidosis, unspecified: Secondary | ICD-10-CM | POA: Diagnosis present

## 2024-04-12 DIAGNOSIS — K7200 Acute and subacute hepatic failure without coma: Secondary | ICD-10-CM | POA: Diagnosis not present

## 2024-04-12 DIAGNOSIS — E781 Pure hyperglyceridemia: Secondary | ICD-10-CM | POA: Diagnosis present

## 2024-04-12 DIAGNOSIS — Z886 Allergy status to analgesic agent status: Secondary | ICD-10-CM

## 2024-04-12 DIAGNOSIS — I5022 Chronic systolic (congestive) heart failure: Secondary | ICD-10-CM

## 2024-04-12 DIAGNOSIS — Y92239 Unspecified place in hospital as the place of occurrence of the external cause: Secondary | ICD-10-CM | POA: Diagnosis not present

## 2024-04-12 DIAGNOSIS — E039 Hypothyroidism, unspecified: Secondary | ICD-10-CM | POA: Diagnosis present

## 2024-04-12 DIAGNOSIS — Z91199 Patient's noncompliance with other medical treatment and regimen due to unspecified reason: Secondary | ICD-10-CM

## 2024-04-12 DIAGNOSIS — I5023 Acute on chronic systolic (congestive) heart failure: Secondary | ICD-10-CM | POA: Diagnosis not present

## 2024-04-12 DIAGNOSIS — K55019 Acute (reversible) ischemia of small intestine, extent unspecified: Secondary | ICD-10-CM | POA: Diagnosis present

## 2024-04-12 LAB — COMPREHENSIVE METABOLIC PANEL WITH GFR
ALT: 195 U/L — ABNORMAL HIGH (ref 0–44)
AST: 125 U/L — ABNORMAL HIGH (ref 15–41)
Albumin: 3.7 g/dL (ref 3.5–5.0)
Alkaline Phosphatase: 87 U/L (ref 38–126)
Anion gap: 13 (ref 5–15)
BUN: 24 mg/dL — ABNORMAL HIGH (ref 8–23)
CO2: 18 mmol/L — ABNORMAL LOW (ref 22–32)
Calcium: 8.6 mg/dL — ABNORMAL LOW (ref 8.9–10.3)
Chloride: 101 mmol/L (ref 98–111)
Creatinine, Ser: 1.18 mg/dL (ref 0.61–1.24)
GFR, Estimated: >60 mL/min (ref >60)
Glucose, Bld: 186 mg/dL — ABNORMAL HIGH (ref 70–99)
Potassium: 4 mmol/L (ref 3.5–5.1)
Sodium: 132 mmol/L — ABNORMAL LOW (ref 135–145)
Total Bilirubin: 1.6 mg/dL — ABNORMAL HIGH (ref 0.0–1.2)
Total Protein: 7.3 g/dL (ref 6.5–8.1)

## 2024-04-12 LAB — CBC WITH DIFFERENTIAL/PLATELET
Abs Immature Granulocytes: 0.07 10*3/uL (ref 0.00–0.07)
Basophils Absolute: 0 10*3/uL (ref 0.0–0.1)
Basophils Relative: 0 %
Eosinophils Absolute: 0 10*3/uL (ref 0.0–0.5)
Eosinophils Relative: 0 %
HCT: 35.4 % — ABNORMAL LOW (ref 39.0–52.0)
Hemoglobin: 10.7 g/dL — ABNORMAL LOW (ref 13.0–17.0)
Immature Granulocytes: 1 %
Lymphocytes Relative: 5 %
Lymphs Abs: 0.7 10*3/uL (ref 0.7–4.0)
MCH: 23.1 pg — ABNORMAL LOW (ref 26.0–34.0)
MCHC: 30.2 g/dL (ref 30.0–36.0)
MCV: 76.3 fL — ABNORMAL LOW (ref 80.0–100.0)
Monocytes Absolute: 0.8 10*3/uL (ref 0.1–1.0)
Monocytes Relative: 7 %
Neutro Abs: 10.5 10*3/uL — ABNORMAL HIGH (ref 1.7–7.7)
Neutrophils Relative %: 87 %
Platelets: 234 10*3/uL (ref 150–400)
RBC: 4.64 MIL/uL (ref 4.22–5.81)
RDW: 15.7 % — ABNORMAL HIGH (ref 11.5–15.5)
WBC: 12.1 10*3/uL — ABNORMAL HIGH (ref 4.0–10.5)
nRBC: 0.7 % — ABNORMAL HIGH (ref 0.0–0.2)

## 2024-04-12 LAB — LIPASE, BLOOD: Lipase: 52 U/L — ABNORMAL HIGH (ref 11–51)

## 2024-04-12 LAB — I-STAT CREATININE, ED: Creatinine, Ser: 1.2 mg/dL (ref 0.61–1.24)

## 2024-04-12 MED ORDER — SODIUM CHLORIDE 0.9 % IV BOLUS
1000.0000 mL | Freq: Once | INTRAVENOUS | Status: AC
  Administered 2024-04-12: 1000 mL via INTRAVENOUS

## 2024-04-12 MED ORDER — FENTANYL CITRATE PF 50 MCG/ML IJ SOSY
50.0000 ug | PREFILLED_SYRINGE | Freq: Once | INTRAMUSCULAR | Status: AC
  Administered 2024-04-12: 50 ug via INTRAVENOUS
  Filled 2024-04-12: qty 1

## 2024-04-12 MED ORDER — IOHEXOL 300 MG/ML  SOLN
75.0000 mL | Freq: Once | INTRAMUSCULAR | Status: AC | PRN
  Administered 2024-04-12: 75 mL via INTRAVENOUS

## 2024-04-12 MED ORDER — DILTIAZEM LOAD VIA INFUSION
10.0000 mg | Freq: Once | INTRAVENOUS | Status: AC
  Administered 2024-04-12 (×2): 10 mg via INTRAVENOUS
  Filled 2024-04-12: qty 10

## 2024-04-12 MED ORDER — HYDROMORPHONE HCL 1 MG/ML IJ SOLN: 0.5000 mg | Freq: Once | INTRAMUSCULAR | Status: DC

## 2024-04-12 MED ORDER — PIPERACILLIN-TAZOBACTAM 3.375 G IVPB 30 MIN
3.3750 g | Freq: Once | INTRAVENOUS | Status: AC
Start: 2024-04-12 — End: 2024-04-12
  Administered 2024-04-12: 3.375 g via INTRAVENOUS
  Filled 2024-04-12: qty 50

## 2024-04-12 MED ORDER — DILTIAZEM HCL-DEXTROSE 125-5 MG/125ML-% IV SOLN (PREMIX)
5.0000 mg/h | INTRAVENOUS | Status: DC
  Administered 2024-04-12 – 2024-04-14 (×2): 5 mg/h via INTRAVENOUS
  Filled 2024-04-12 (×2): qty 125

## 2024-04-12 NOTE — ED Provider Notes (Signed)
 Care of patient assumed from Dr. Adrien Morales.  Patient presenting with abdominal pain for the past 3 days.  Has history of A-fib.  Had RVR while in the ED.  He was started on Dilt gtt.  Currently awaiting results of CT abdomen and pelvis. Physical Exam  BP (!) 130/90 (BP Location: Left Arm)   Pulse (!) 165   Temp 97.7 F (36.5 C) (Oral)   Resp (!) 21   Ht 5\' 10"  (1.778 m)   Wt 87.5 kg   SpO2 98%   BMI 27.69 kg/m   Physical Exam Vitals and nursing note reviewed.  Constitutional:      General: He is not in acute distress.    Appearance: He is well-developed. He is not ill-appearing, toxic-appearing or diaphoretic.  HENT:     Head: Normocephalic and atraumatic.  Eyes:     Conjunctiva/sclera: Conjunctivae normal.  Cardiovascular:     Rate and Rhythm: Normal rate and regular rhythm.  Pulmonary:     Effort: Pulmonary effort is normal. No respiratory distress.  Abdominal:     Palpations: Abdomen is soft.  Musculoskeletal:        General: No swelling.     Cervical back: Neck supple.  Skin:    General: Skin is warm and dry.     Coloration: Skin is not cyanotic or jaundiced.  Neurological:     General: No focal deficit present.     Mental Status: He is alert and oriented to person, place, and time.  Psychiatric:        Mood and Affect: Mood normal.        Behavior: Behavior normal.     Procedures  Procedures  ED Course / MDM    Medical Decision Making Amount and/or Complexity of Data Reviewed Labs: ordered. Radiology: ordered.  Risk Prescription drug management. Decision regarding hospitalization.   There was ongoing delay in read of CT scan.  I spoke with [facility] radiology who was able to get me a wet read over the phone.  Patient does have mesenteric and bowel wall edema.  There is concern of SMV thrombosis, however, initial study contrast was timed during arterial phase and SMV patency is unable to be evaluated.  They do recommend repeat study with venous phase  timing.  This was ordered.  Patient's initial lactic acid was 2.8.  This does raise concern for ischemia secondary to thrombus.  Additionally, he has a history of A-fib and has been off of his medications for the past 2 years.  He is at risk for embolic events.  Heparin was ordered.  General surgery was consulted.  I spoke with general surgeon, Dr. Ramiro Morales, who came and evaluated the patient at bedside.  Plan will be for exploratory surgery tonight.  Heparin and repeat CT scan were discontinued.  She did request an ICU admission following surgery.  I spoke with intensivist, Dr. Mason Morales, who requested call from surgeon once patient is out of the OR.  This was relayed to Dr. Ramiro Morales.  Patient was taken to the OR for further management.       Donald Mariner, MD 04/13/24 [phone]

## 2024-04-12 NOTE — ED Triage Notes (Signed)
 Pt states he has had no BM in the past 5 days and is now having abd cramping. No N/V.

## 2024-04-12 NOTE — ED Provider Notes (Signed)
 [place] EMERGENCY DEPARTMENT AT [facility] Provider Note   CSN: [id number] Arrival date & time: 04/12/24  2017     History  No chief complaint on file.   Donald Morales is a 68 y.o. male.  HPI 68 year old male with a history of hypertension, thyroid disease, A-fib on Eliquis , presents with abdominal pain.  He states that a few weeks ago he had a URI that progressed to his abdomen.  At first it seemed like it was due to his cough but over the past few days he has been having progressively worsening abdominal pain.  It feels like a cramping sensation.  He has been constipated without a solid bowel movement in about 5 days that he is had real small bowel movements.  He has not had any fevers or vomiting.  He has been having decreased urine output without dysuria. Cough and URI symptoms are resolved.  Home Medications Prior to Admission medications   Medication Sig Start Date End Date Taking? Authorizing Provider  acetaminophen (TYLENOL) 500 MG tablet Take 1,000 mg by mouth every 6 (six) hours as needed for moderate pain.    [provider]  amiodarone  (PACERONE ) 200 MG tablet Take 1 tablet (200 mg total) by mouth 2 (two) times daily. Please call [phone] to schedule an overdue appointment for future refills. Thank you. 1st attempt. Patient not taking: Reported on 10/17/2022 08/10/22   Lei Pump, MD  apixaban  (ELIQUIS ) 5 MG TABS tablet Take 1 tablet (5 mg total) by mouth 2 (two) times daily. 02/09/22   Allred, Royston Cornea, MD  Ascorbic Acid (VITAMIN C PO) Take 2 tablets by mouth daily as needed (immune support).    [provider]  diltiazem  (CARDIZEM  CD) 240 MG 24 hr capsule Take 1 capsule (240 mg total) by mouth in the morning and at bedtime. 01/26/22 10/17/22  Allred, Royston Cornea, MD  levothyroxine (SYNTHROID) 125 MCG tablet TAKE 1 TABLET BY MOUTH EVERY DAY IN THE MORNING ON EMPTY STOMACH FOR 30 DAYS    [provider]  omeprazole (PRILOSEC OTC) 20  MG tablet Take 20 mg by mouth daily as needed (acid reflux).    [provider]      Allergies    Aspirin and Codeine    Review of Systems   Review of Systems  Constitutional:  Negative for fever.  Respiratory:  Negative for cough and shortness of breath.   Gastrointestinal:  Positive for abdominal distention, abdominal pain and constipation. Negative for blood in stool and vomiting.  Genitourinary:  Positive for decreased urine volume. Negative for dysuria.    Physical Exam Updated Vital Signs There were no vitals taken for this visit. Physical Exam Vitals and nursing note reviewed.  Constitutional:      General: He is not in acute distress.    Appearance: He is well-developed. He is not ill-appearing or diaphoretic.  HENT:     Head: Normocephalic and atraumatic.  Cardiovascular:     Rate and Rhythm: Normal rate and regular rhythm.     Heart sounds: Normal heart sounds.  Pulmonary:     Effort: Pulmonary effort is normal.     Breath sounds: Normal breath sounds.  Abdominal:     Palpations: Abdomen is soft.     Tenderness: There is generalized abdominal tenderness.  Skin:    General: Skin is warm and dry.  Neurological:     Mental Status: He is alert.     ED Results / Procedures / Treatments  Labs (all labs ordered are listed, but only abnormal results are displayed) Labs Reviewed  COMPREHENSIVE METABOLIC PANEL WITH GFR  LIPASE, BLOOD  CBC WITH DIFFERENTIAL/PLATELET  URINALYSIS, W/ REFLEX TO CULTURE (INFECTION SUSPECTED)  I-STAT CREATININE, ED    EKG None  Radiology No results found.  Procedures .Critical Care  Performed by: Jerilynn Montenegro, MD Authorized by: Jerilynn Montenegro, MD   Critical care provider statement:    Critical care time (minutes):  40   Critical care time was exclusive of:  Separately billable procedures and treating other patients   Critical care was necessary to treat or prevent imminent or life-threatening deterioration of  the following conditions:  Circulatory failure and sepsis   Critical care was time spent personally by me on the following activities:  Development of treatment plan with patient or surrogate, discussions with consultants, evaluation of patient's response to treatment, examination of patient, ordering and review of laboratory studies, ordering and review of radiographic studies, ordering and performing treatments and interventions, pulse oximetry, re-evaluation of patient's condition and review of old charts     Medications Ordered in ED Medications  sodium chloride 0.9 % bolus 1,000 mL (has no administration in time range)  fentaNYL (SUBLIMAZE) injection 50 mcg (has no administration in time range)    ED Course/ Medical Decision Making/ A&P                                 Medical Decision Making Amount and/or Complexity of Data Reviewed Labs: ordered.    Details: Abnormal LFTs Leukocytosis Radiology: ordered. ECG/medicine tests: ordered and independent interpretation performed.    Details: Afib with RVR  Risk Prescription drug management.   Has been given IV fentanyl with complete resolution of his pain though his pain did come back and he required a second dose.  Was given some fluids.  While in the ED during his second pain episode he did develop A-fib with RVR.  He has known A-fib.  I have started him on diltiazem  bolus and drip.  We have also started him on Zosyn as my view of the CT images is concerning for small amount of free fluid as well as likely diverticulitis.  CT is currently pending. Care transferred to Dr. Kermit Ped.        Final Clinical Impression(s) / ED Diagnoses Final diagnoses:  None    Rx / DC Orders ED Discharge Orders     None         Jerilynn Montenegro, MD 04/12/24 2329

## 2024-04-13 ENCOUNTER — Encounter (HOSPITAL_COMMUNITY)
Admission: EM | Disposition: A | Discharge status: Home / Self Care | Payer: Self-pay | Source: Home / Self Care | Attending: Internal Medicine

## 2024-04-13 ENCOUNTER — Inpatient Hospital Stay (HOSPITAL_COMMUNITY): Payer: Self-pay

## 2024-04-13 ENCOUNTER — Emergency Department (HOSPITAL_COMMUNITY): Payer: Self-pay | Admitting: Anesthesiology

## 2024-04-13 DIAGNOSIS — K55019 Acute (reversible) ischemia of small intestine, extent unspecified: Secondary | ICD-10-CM | POA: Diagnosis present

## 2024-04-13 DIAGNOSIS — R1030 Lower abdominal pain, unspecified: Secondary | ICD-10-CM | POA: Diagnosis present

## 2024-04-13 DIAGNOSIS — J9600 Acute respiratory failure, unspecified whether with hypoxia or hypercapnia: Secondary | ICD-10-CM | POA: Diagnosis not present

## 2024-04-13 DIAGNOSIS — D696 Thrombocytopenia, unspecified: Secondary | ICD-10-CM | POA: Diagnosis not present

## 2024-04-13 DIAGNOSIS — E781 Pure hyperglyceridemia: Secondary | ICD-10-CM | POA: Diagnosis present

## 2024-04-13 DIAGNOSIS — J9601 Acute respiratory failure with hypoxia: Secondary | ICD-10-CM | POA: Diagnosis not present

## 2024-04-13 DIAGNOSIS — G9341 Metabolic encephalopathy: Secondary | ICD-10-CM | POA: Diagnosis not present

## 2024-04-13 DIAGNOSIS — I509 Heart failure, unspecified: Secondary | ICD-10-CM | POA: Diagnosis not present

## 2024-04-13 DIAGNOSIS — E876 Hypokalemia: Secondary | ICD-10-CM | POA: Diagnosis not present

## 2024-04-13 DIAGNOSIS — K55029 Acute infarction of small intestine, extent unspecified: Secondary | ICD-10-CM | POA: Diagnosis present

## 2024-04-13 DIAGNOSIS — I4892 Unspecified atrial flutter: Secondary | ICD-10-CM | POA: Diagnosis present

## 2024-04-13 DIAGNOSIS — E871 Hypo-osmolality and hyponatremia: Secondary | ICD-10-CM | POA: Diagnosis not present

## 2024-04-13 DIAGNOSIS — K55069 Acute infarction of intestine, part and extent unspecified: Secondary | ICD-10-CM | POA: Diagnosis not present

## 2024-04-13 DIAGNOSIS — I4819 Other persistent atrial fibrillation: Secondary | ICD-10-CM | POA: Diagnosis not present

## 2024-04-13 DIAGNOSIS — I428 Other cardiomyopathies: Secondary | ICD-10-CM | POA: Diagnosis present

## 2024-04-13 DIAGNOSIS — E039 Hypothyroidism, unspecified: Secondary | ICD-10-CM | POA: Diagnosis present

## 2024-04-13 DIAGNOSIS — I5022 Chronic systolic (congestive) heart failure: Secondary | ICD-10-CM

## 2024-04-13 DIAGNOSIS — I5023 Acute on chronic systolic (congestive) heart failure: Secondary | ICD-10-CM | POA: Diagnosis not present

## 2024-04-13 DIAGNOSIS — I110 Hypertensive heart disease with heart failure: Secondary | ICD-10-CM | POA: Diagnosis present

## 2024-04-13 DIAGNOSIS — A419 Sepsis, unspecified organism: Secondary | ICD-10-CM | POA: Diagnosis present

## 2024-04-13 DIAGNOSIS — K7200 Acute and subacute hepatic failure without coma: Secondary | ICD-10-CM | POA: Diagnosis not present

## 2024-04-13 DIAGNOSIS — R6521 Severe sepsis with septic shock: Secondary | ICD-10-CM | POA: Diagnosis present

## 2024-04-13 DIAGNOSIS — D62 Acute posthemorrhagic anemia: Secondary | ICD-10-CM | POA: Diagnosis not present

## 2024-04-13 DIAGNOSIS — F05 Delirium due to known physiological condition: Secondary | ICD-10-CM | POA: Diagnosis not present

## 2024-04-13 DIAGNOSIS — I4811 Longstanding persistent atrial fibrillation: Secondary | ICD-10-CM | POA: Diagnosis present

## 2024-04-13 DIAGNOSIS — Y92239 Unspecified place in hospital as the place of occurrence of the external cause: Secondary | ICD-10-CM | POA: Diagnosis not present

## 2024-04-13 DIAGNOSIS — K559 Vascular disorder of intestine, unspecified: Secondary | ICD-10-CM | POA: Diagnosis present

## 2024-04-13 DIAGNOSIS — I4891 Unspecified atrial fibrillation: Secondary | ICD-10-CM | POA: Diagnosis not present

## 2024-04-13 DIAGNOSIS — K567 Ileus, unspecified: Secondary | ICD-10-CM | POA: Diagnosis not present

## 2024-04-13 DIAGNOSIS — I4510 Unspecified right bundle-branch block: Secondary | ICD-10-CM | POA: Diagnosis present

## 2024-04-13 DIAGNOSIS — E872 Acidosis, unspecified: Secondary | ICD-10-CM | POA: Diagnosis present

## 2024-04-13 HISTORY — PX: BOWEL RESECTION: SHX1257

## 2024-04-13 HISTORY — PX: LAPAROTOMY: SHX154

## 2024-04-13 HISTORY — PX: LAPAROSCOPY: SHX197

## 2024-04-13 HISTORY — PX: APPLICATION OF WOUND VAC: SHX5189

## 2024-04-13 LAB — GLUCOSE, CAPILLARY
Glucose-Capillary: 162 mg/dL — ABNORMAL HIGH (ref 70–99)
Glucose-Capillary: 171 mg/dL — ABNORMAL HIGH (ref 70–99)
Glucose-Capillary: 148 mg/dL — ABNORMAL HIGH (ref 70–99)
Glucose-Capillary: 133 mg/dL — ABNORMAL HIGH (ref 70–99)
Glucose-Capillary: 123 mg/dL — ABNORMAL HIGH (ref 70–99)

## 2024-04-13 LAB — ECHOCARDIOGRAM LIMITED
Calc EF: 39.1 %
Height: 69 in
MV M vel: 4.48 m/s
MV Peak grad: 80.3 mmHg
Radius: 0.3 cm
S' Lateral: 4.5 cm
Single Plane A2C EF: 39.7 %
Single Plane A4C EF: 39.6 %
Weight: 3354.52 [oz_av]

## 2024-04-13 LAB — URINALYSIS, W/ REFLEX TO CULTURE (INFECTION SUSPECTED)
Bilirubin Urine: NEGATIVE
Glucose, UA: NEGATIVE mg/dL
Ketones, ur: NEGATIVE mg/dL
Leukocytes,Ua: NEGATIVE
Nitrite: NEGATIVE
Protein, ur: 100 mg/dL — AB
RBC / HPF: >50 RBC/hpf (ref 0–5)
Specific Gravity, Urine: >1.046 — ABNORMAL HIGH (ref 1.005–1.030)
pH: 5 (ref 5.0–8.0)

## 2024-04-13 LAB — COMPREHENSIVE METABOLIC PANEL WITH GFR
ALT: 142 U/L — ABNORMAL HIGH (ref 0–44)
AST: 77 U/L — ABNORMAL HIGH (ref 15–41)
Albumin: 3.3 g/dL — ABNORMAL LOW (ref 3.5–5.0)
Alkaline Phosphatase: 65 U/L (ref 38–126)
Anion gap: 9 (ref 5–15)
BUN: 24 mg/dL — ABNORMAL HIGH (ref 8–23)
CO2: 19 mmol/L — ABNORMAL LOW (ref 22–32)
Calcium: 8.3 mg/dL — ABNORMAL LOW (ref 8.9–10.3)
Chloride: 104 mmol/L (ref 98–111)
Creatinine, Ser: 1.06 mg/dL (ref 0.61–1.24)
GFR, Estimated: >60 mL/min (ref >60)
Glucose, Bld: 179 mg/dL — ABNORMAL HIGH (ref 70–99)
Potassium: 4.5 mmol/L (ref 3.5–5.1)
Sodium: 132 mmol/L — ABNORMAL LOW (ref 135–145)
Total Bilirubin: 1.7 mg/dL — ABNORMAL HIGH (ref 0.0–1.2)
Total Protein: 6.1 g/dL — ABNORMAL LOW (ref 6.5–8.1)

## 2024-04-13 LAB — BLOOD GAS, ARTERIAL
Acid-base deficit: 3.9 mmol/L — ABNORMAL HIGH (ref 0.0–2.0)
Bicarbonate: 19.5 mmol/L — ABNORMAL LOW (ref 20.0–28.0)
FIO2: 90 %
MECHVT: 580 mL
O2 Saturation: 100 %
PEEP: 5 cmH2O
Patient temperature: 35.8
RATE: 20 {breaths}/min
pCO2 arterial: 28 mmHg — ABNORMAL LOW (ref 32–48)
pH, Arterial: 7.44 (ref 7.35–7.45)
pO2, Arterial: 348 mmHg — ABNORMAL HIGH (ref 83–108)

## 2024-04-13 LAB — BASIC METABOLIC PANEL WITH GFR
Anion gap: 3 — ABNORMAL LOW (ref 5–15)
BUN: 21 mg/dL (ref 8–23)
CO2: 24 mmol/L (ref 22–32)
Calcium: 7.7 mg/dL — ABNORMAL LOW (ref 8.9–10.3)
Chloride: 108 mmol/L (ref 98–111)
Creatinine, Ser: 1.12 mg/dL (ref 0.61–1.24)
GFR, Estimated: >60 mL/min (ref >60)
Glucose, Bld: 121 mg/dL — ABNORMAL HIGH (ref 70–99)
Potassium: 4.5 mmol/L (ref 3.5–5.1)
Sodium: 135 mmol/L (ref 135–145)

## 2024-04-13 LAB — T4, FREE: Free T4: 0.87 ng/dL (ref 0.61–1.12)

## 2024-04-13 LAB — LACTIC ACID, PLASMA
Lactic Acid, Venous: 2.1 mmol/L (ref 0.5–1.9)
Lactic Acid, Venous: 2.4 mmol/L (ref 0.5–1.9)
Lactic Acid, Venous: 2.8 mmol/L (ref 0.5–1.9)
Lactic Acid, Venous: 4.9 mmol/L (ref 0.5–1.9)

## 2024-04-13 LAB — PHOSPHORUS: Phosphorus: 4.9 mg/dL — ABNORMAL HIGH (ref 2.5–4.6)

## 2024-04-13 LAB — HEPARIN LEVEL (UNFRACTIONATED)
Heparin Unfractionated: 0.14 [IU]/mL — ABNORMAL LOW (ref 0.30–0.70)
Heparin Unfractionated: 0.44 [IU]/mL (ref 0.30–0.70)

## 2024-04-13 LAB — CBC
HCT: 30.8 % — ABNORMAL LOW (ref 39.0–52.0)
Hemoglobin: 9.3 g/dL — ABNORMAL LOW (ref 13.0–17.0)
MCH: 22.6 pg — ABNORMAL LOW (ref 26.0–34.0)
MCHC: 30.2 g/dL (ref 30.0–36.0)
MCV: 74.9 fL — ABNORMAL LOW (ref 80.0–100.0)
Platelets: 186 10*3/uL (ref 150–400)
RBC: 4.11 MIL/uL — ABNORMAL LOW (ref 4.22–5.81)
RDW: 15.5 % (ref 11.5–15.5)
WBC: 11.4 10*3/uL — ABNORMAL HIGH (ref 4.0–10.5)
nRBC: 1.5 % — ABNORMAL HIGH (ref 0.0–0.2)

## 2024-04-13 LAB — TSH: TSH: 4.666 u[IU]/mL — ABNORMAL HIGH (ref 0.350–4.500)

## 2024-04-13 LAB — HIV ANTIBODY (ROUTINE TESTING W REFLEX): HIV Screen 4th Generation wRfx: NONREACTIVE

## 2024-04-13 LAB — ABO/RH: ABO/RH(D): O NEG

## 2024-04-13 LAB — MRSA NEXT GEN BY PCR, NASAL: MRSA by PCR Next Gen: NOT DETECTED

## 2024-04-13 LAB — MAGNESIUM
Magnesium: 2.1 mg/dL (ref 1.7–2.4)
Magnesium: 2.2 mg/dL (ref 1.7–2.4)

## 2024-04-13 SURGERY — LAPAROSCOPY, DIAGNOSTIC
Anesthesia: General

## 2024-04-13 MED ORDER — FENTANYL CITRATE PF 50 MCG/ML IJ SOSY: 25.0000 ug | PREFILLED_SYRINGE | INTRAMUSCULAR | Status: DC | PRN

## 2024-04-13 MED ORDER — DEXAMETHASONE SODIUM PHOSPHATE 10 MG/ML IJ SOLN
INTRAMUSCULAR | Status: DC | PRN
  Administered 2024-04-13: 10 mg via INTRAVENOUS

## 2024-04-13 MED ORDER — VANCOMYCIN HCL 1750 MG/350ML IV SOLN
1750.0000 mg | Freq: Once | INTRAVENOUS | Status: AC
  Administered 2024-04-13: 1750 mg via INTRAVENOUS
  Filled 2024-04-13: qty 350

## 2024-04-13 MED ORDER — SODIUM CHLORIDE 0.9 % IV SOLN: INTRAVENOUS | Status: AC

## 2024-04-13 MED ORDER — LACTATED RINGERS IR SOLN
Status: DC | PRN
  Administered 2024-04-13: 1000 mL

## 2024-04-13 MED ORDER — INSULIN ASPART 100 UNIT/ML IJ SOLN
0.0000 [IU] | INTRAMUSCULAR | Status: DC
Start: 2024-04-13 — End: 2024-04-16
  Administered 2024-04-13: 2 [IU] via SUBCUTANEOUS
  Administered 2024-04-13 (×2): 3 [IU] via SUBCUTANEOUS
  Administered 2024-04-13: 1 [IU] via SUBCUTANEOUS
  Administered 2024-04-13 – 2024-04-14 (×4): 2 [IU] via SUBCUTANEOUS
  Administered 2024-04-15: 3 [IU] via SUBCUTANEOUS
  Administered 2024-04-15 (×4): 2 [IU] via SUBCUTANEOUS
  Administered 2024-04-16 (×3): 3 [IU] via SUBCUTANEOUS

## 2024-04-13 MED ORDER — PHENYLEPHRINE 80 MCG/ML (10ML) SYRINGE FOR IV PUSH (FOR BLOOD PRESSURE SUPPORT)
PREFILLED_SYRINGE | INTRAVENOUS | Status: AC
  Filled 2024-04-13: qty 10

## 2024-04-13 MED ORDER — SODIUM CHLORIDE 0.9 % IV SOLN
250.0000 mL | INTRAVENOUS | Status: AC
  Administered 2024-04-13: 250 mL via INTRAVENOUS

## 2024-04-13 MED ORDER — HEPARIN (PORCINE) 25000 UT/250ML-% IV SOLN: 10.0000 [IU]/kg/h | INTRAVENOUS | Status: DC

## 2024-04-13 MED ORDER — HEPARIN BOLUS VIA INFUSION
2500.0000 [IU] | Freq: Once | INTRAVENOUS | Status: AC
  Administered 2024-04-13: 2500 [IU] via INTRAVENOUS
  Filled 2024-04-13: qty 2500

## 2024-04-13 MED ORDER — METOPROLOL TARTRATE 5 MG/5ML IV SOLN
INTRAVENOUS | Status: AC
  Filled 2024-04-13: qty 5

## 2024-04-13 MED ORDER — PROPOFOL 10 MG/ML IV BOLUS
INTRAVENOUS | Status: DC | PRN
  Administered 2024-04-13: 120 mg via INTRAVENOUS

## 2024-04-13 MED ORDER — FENTANYL CITRATE PF 50 MCG/ML IJ SOSY
50.0000 ug | PREFILLED_SYRINGE | INTRAMUSCULAR | Status: AC | PRN
  Administered 2024-04-13: 50 ug via INTRAVENOUS
  Administered 2024-04-14 (×2): 100 ug via INTRAVENOUS
  Filled 2024-04-13: qty 1

## 2024-04-13 MED ORDER — PHENYLEPHRINE HCL-NACL 20-0.9 MG/250ML-% IV SOLN
INTRAVENOUS | Status: DC | PRN
  Administered 2024-04-13: 25 ug/min via INTRAVENOUS

## 2024-04-13 MED ORDER — PANTOPRAZOLE SODIUM 40 MG IV SOLR
40.0000 mg | INTRAVENOUS | Status: DC
  Administered 2024-04-13 – 2024-04-20 (×8): 40 mg via INTRAVENOUS
  Filled 2024-04-13 (×8): qty 10

## 2024-04-13 MED ORDER — ENOXAPARIN SODIUM 40 MG/0.4ML IJ SOSY
40.0000 mg | PREFILLED_SYRINGE | Freq: Once | INTRAMUSCULAR | Status: DC
Start: 2024-04-13 — End: 2024-04-13

## 2024-04-13 MED ORDER — FENTANYL 2500MCG IN NS 250ML (10MCG/ML) PREMIX INFUSION
0.0000 ug/h | INTRAVENOUS | Status: DC
  Administered 2024-04-13: 25 ug/h via INTRAVENOUS
  Administered 2024-04-15: 100 ug/h via INTRAVENOUS
  Filled 2024-04-13 (×2): qty 250

## 2024-04-13 MED ORDER — ONDANSETRON HCL 4 MG/2ML IJ SOLN
4.0000 mg | Freq: Four times a day (QID) | INTRAMUSCULAR | Status: DC | PRN
  Administered 2024-04-13 – 2024-04-21 (×9): 4 mg via INTRAVENOUS
  Filled 2024-04-13 (×9): qty 2

## 2024-04-13 MED ORDER — PROPOFOL 1000 MG/100ML IV EMUL
5.0000 ug/kg/min | INTRAVENOUS | Status: DC
  Administered 2024-04-13 – 2024-04-15 (×8): 30 ug/kg/min via INTRAVENOUS
  Filled 2024-04-13 (×9): qty 100

## 2024-04-13 MED ORDER — SUCCINYLCHOLINE CHLORIDE 200 MG/10ML IV SOSY
PREFILLED_SYRINGE | INTRAVENOUS | Status: DC | PRN
  Administered 2024-04-13: 140 mg via INTRAVENOUS

## 2024-04-13 MED ORDER — FAMOTIDINE IN NACL 20-0.9 MG/50ML-% IV SOLN: 20.0000 mg | Freq: Two times a day (BID) | INTRAVENOUS | Status: DC

## 2024-04-13 MED ORDER — PERFLUTREN LIPID MICROSPHERE
1.0000 mL | INTRAVENOUS | Status: AC | PRN
  Administered 2024-04-13: 2 mL via INTRAVENOUS

## 2024-04-13 MED ORDER — HEPARIN BOLUS VIA INFUSION
4000.0000 [IU] | Freq: Once | INTRAVENOUS | Status: AC
  Administered 2024-04-13: 4000 [IU] via INTRAVENOUS
  Filled 2024-04-13: qty 4000

## 2024-04-13 MED ORDER — METOPROLOL TARTRATE 5 MG/5ML IV SOLN
INTRAVENOUS | Status: DC | PRN
  Administered 2024-04-13: 2 mg via INTRAVENOUS

## 2024-04-13 MED ORDER — LIDOCAINE HCL (PF) 2 % IJ SOLN
INTRAMUSCULAR | Status: AC
  Filled 2024-04-13: qty 5

## 2024-04-13 MED ORDER — ALBUMIN HUMAN 5 % IV SOLN: INTRAVENOUS | Status: DC | PRN

## 2024-04-13 MED ORDER — PHENYLEPHRINE HCL-NACL 20-0.9 MG/250ML-% IV SOLN
25.0000 ug/min | INTRAVENOUS | Status: DC
  Administered 2024-04-13 (×2): 50 ug/min via INTRAVENOUS
  Administered 2024-04-14: 60 ug/min via INTRAVENOUS
  Administered 2024-04-14: 30 ug/min via INTRAVENOUS
  Administered 2024-04-14: 50 ug/min via INTRAVENOUS
  Filled 2024-04-13 (×5): qty 250

## 2024-04-13 MED ORDER — VANCOMYCIN HCL 1750 MG/350ML IV SOLN: 1750.0000 mg | INTRAVENOUS | Status: DC

## 2024-04-13 MED ORDER — DEXAMETHASONE SODIUM PHOSPHATE 10 MG/ML IJ SOLN
INTRAMUSCULAR | Status: AC
  Filled 2024-04-13: qty 1

## 2024-04-13 MED ORDER — LIDOCAINE HCL (CARDIAC) PF 100 MG/5ML IV SOSY
PREFILLED_SYRINGE | INTRAVENOUS | Status: DC | PRN
  Administered 2024-04-13: 100 mg via INTRAVENOUS

## 2024-04-13 MED ORDER — FENTANYL CITRATE (PF) 100 MCG/2ML IJ SOLN
INTRAMUSCULAR | Status: DC | PRN
  Administered 2024-04-13 (×2): 100 ug via INTRAVENOUS
  Administered 2024-04-13: 50 ug via INTRAVENOUS

## 2024-04-13 MED ORDER — VANCOMYCIN HCL 1500 MG/300ML IV SOLN
1500.0000 mg | INTRAVENOUS | Status: DC
  Administered 2024-04-14 – 2024-04-15 (×2): 1500 mg via INTRAVENOUS
  Filled 2024-04-13 (×2): qty 300

## 2024-04-13 MED ORDER — CHLORHEXIDINE GLUCONATE CLOTH 2 % EX PADS
6.0000 | MEDICATED_PAD | Freq: Every day | CUTANEOUS | Status: DC
  Administered 2024-04-13 – 2024-04-27 (×13): 6 via TOPICAL

## 2024-04-13 MED ORDER — ROCURONIUM BROMIDE 10 MG/ML (PF) SYRINGE
PREFILLED_SYRINGE | INTRAVENOUS | Status: DC | PRN
  Administered 2024-04-13: 60 mg via INTRAVENOUS
  Administered 2024-04-13: 40 mg via INTRAVENOUS

## 2024-04-13 MED ORDER — PROPOFOL 10 MG/ML IV BOLUS
INTRAVENOUS | Status: AC
  Filled 2024-04-13: qty 20

## 2024-04-13 MED ORDER — PIPERACILLIN-TAZOBACTAM 3.375 G IVPB 30 MIN: 3.3750 g | Freq: Four times a day (QID) | INTRAVENOUS | Status: DC

## 2024-04-13 MED ORDER — SODIUM CHLORIDE 0.9 % IR SOLN
Status: DC | PRN
Start: 2024-04-13 — End: 2024-04-13
  Administered 2024-04-13 (×2): 1000 mL

## 2024-04-13 MED ORDER — ORAL CARE MOUTH RINSE: 15.0000 mL | OROMUCOSAL | Status: DC | PRN

## 2024-04-13 MED ORDER — ONDANSETRON HCL 4 MG/2ML IJ SOLN: 4.0000 mg | Freq: Once | INTRAMUSCULAR | Status: DC | PRN

## 2024-04-13 MED ORDER — LACTATED RINGERS IV SOLN: INTRAVENOUS | Status: DC | PRN

## 2024-04-13 MED ORDER — SODIUM CHLORIDE 0.9 % IV SOLN: INTRAVENOUS | Status: DC | PRN

## 2024-04-13 MED ORDER — ROCURONIUM BROMIDE 10 MG/ML (PF) SYRINGE
PREFILLED_SYRINGE | INTRAVENOUS | Status: AC
  Filled 2024-04-13: qty 10

## 2024-04-13 MED ORDER — SUCCINYLCHOLINE CHLORIDE 200 MG/10ML IV SOSY
PREFILLED_SYRINGE | INTRAVENOUS | Status: AC
  Filled 2024-04-13: qty 10

## 2024-04-13 MED ORDER — ORAL CARE MOUTH RINSE
15.0000 mL | OROMUCOSAL | Status: DC
  Administered 2024-04-13 – 2024-04-15 (×26): 15 mL via OROMUCOSAL

## 2024-04-13 MED ORDER — HEPARIN (PORCINE) 25000 UT/250ML-% IV SOLN
1400.0000 [IU]/h | INTRAVENOUS | Status: DC
Start: 2024-04-13 — End: 2024-04-13
  Administered 2024-04-13: 1400 [IU]/h via INTRAVENOUS
  Filled 2024-04-13: qty 250

## 2024-04-13 MED ORDER — HEPARIN (PORCINE) 25000 UT/250ML-% IV SOLN
1350.0000 [IU]/h | INTRAVENOUS | Status: AC
  Administered 2024-04-13: 1100 [IU]/h via INTRAVENOUS
  Administered 2024-04-13: 1350 [IU]/h via INTRAVENOUS
  Filled 2024-04-13 (×2): qty 250

## 2024-04-13 MED ORDER — FAMOTIDINE 20 MG PO TABS
20.0000 mg | ORAL_TABLET | Freq: Two times a day (BID) | ORAL | Status: DC
  Filled 2024-04-13: qty 1

## 2024-04-13 MED ORDER — FENTANYL CITRATE (PF) 250 MCG/5ML IJ SOLN
INTRAMUSCULAR | Status: AC
  Filled 2024-04-13: qty 5

## 2024-04-13 MED ORDER — ETOMIDATE 2 MG/ML IV SOLN
INTRAVENOUS | Status: AC
  Filled 2024-04-13: qty 10

## 2024-04-13 MED ORDER — PIPERACILLIN-TAZOBACTAM 3.375 G IVPB
3.3750 g | Freq: Three times a day (TID) | INTRAVENOUS | Status: DC
Start: 2024-04-13 — End: 2024-04-18
  Administered 2024-04-13 – 2024-04-18 (×16): 3.375 g via INTRAVENOUS
  Filled 2024-04-13 (×16): qty 50

## 2024-04-13 MED ORDER — ORAL CARE MOUTH RINSE
15.0000 mL | OROMUCOSAL | Status: DC
  Administered 2024-04-13: 15 mL via OROMUCOSAL

## 2024-04-13 SURGICAL SUPPLY — 53 items
BAG COUNTER SPONGE SURGICOUNT (BAG) IMPLANT
BLADE EXTENDED COATED 6.5IN (ELECTRODE) IMPLANT
CABLE HIGH FREQUENCY MONO STRZ (ELECTRODE) ×3 IMPLANT
CELLS DAT CNTRL 66122 CELL SVR (MISCELLANEOUS) IMPLANT
CLIP APPLIE 5 13 M/L LIGAMAX5 (MISCELLANEOUS) IMPLANT
CLIP APPLIE ROT 10 11.4 M/L (STAPLE) IMPLANT
COUNTER NEEDLE 1200 MAGNETIC (NEEDLE) ×3 IMPLANT
COVER MAYO STAND STRL (DRAPES) ×9 IMPLANT
DRAIN CHANNEL 19F RND (DRAIN) IMPLANT
DRAPE LAPAROSCOPIC ABDOMINAL (DRAPES) ×3 IMPLANT
DRSG OPSITE POSTOP 4X10 (GAUZE/BANDAGES/DRESSINGS) IMPLANT
DRSG OPSITE POSTOP 4X6 (GAUZE/BANDAGES/DRESSINGS) IMPLANT
DRSG OPSITE POSTOP 4X8 (GAUZE/BANDAGES/DRESSINGS) IMPLANT
ELECT REM PT RETURN 15FT ADLT (MISCELLANEOUS) ×3 IMPLANT
EVACUATOR SILICONE 100CC (DRAIN) IMPLANT
GAUZE SPONGE 4X4 12PLY STRL (GAUZE/BANDAGES/DRESSINGS) IMPLANT
GLOVE INDICATOR 6.5 STRL GRN (GLOVE) ×6 IMPLANT
IRRIGATION SUCT STRKRFLW 2 WTP (MISCELLANEOUS) ×3 IMPLANT
KIT TURNOVER KIT A (KITS) IMPLANT
LEGGING LITHOTOMY PAIR STRL (DRAPES) IMPLANT
LIGASURE IMPACT 36 18CM CVD LR (INSTRUMENTS) IMPLANT
PACK COLON (CUSTOM PROCEDURE TRAY) ×3 IMPLANT
PAD POSITIONING PINK XL (MISCELLANEOUS) ×3 IMPLANT
PROTECTOR NERVE ULNAR (MISCELLANEOUS) IMPLANT
RELOAD PROXIMATE 75MM BLUE (ENDOMECHANICALS) ×2 IMPLANT
RELOAD STAPLE 75 3.8 BLU REG (ENDOMECHANICALS) IMPLANT
RETRACTOR WND ALEXIS 18 MED (MISCELLANEOUS) IMPLANT
RETRACTOR WND ALEXIS 25 LRG (MISCELLANEOUS) IMPLANT
RETRACTOR WOUND ALXS 25CM LRG (MISCELLANEOUS) ×2 IMPLANT
SCISSORS LAP 5X35 DISP (ENDOMECHANICALS) ×3 IMPLANT
SET TUBE SMOKE EVAC HIGH FLOW (TUBING) ×3 IMPLANT
SLEEVE Z-THREAD 5X100MM (TROCAR) ×3 IMPLANT
SPIKE FLUID TRANSFER (MISCELLANEOUS) ×3 IMPLANT
SPONGE T-LAP 18X18 ~~LOC~~+RFID (SPONGE) IMPLANT
STAPLER PROXIMATE 75MM BLUE (STAPLE) IMPLANT
STAPLER SKIN PROX 35W (STAPLE) ×3 IMPLANT
SURGILUBE 2OZ TUBE FLIPTOP (MISCELLANEOUS) IMPLANT
SUT ETHILON 3 0 PS 1 (SUTURE) IMPLANT
SUT NOVA NAB DX-16 0-1 5-0 T12 (SUTURE) IMPLANT
SUT PDS AB 1 TP1 96 (SUTURE) IMPLANT
SUT PROLENE 2 0 SH DA (SUTURE) IMPLANT
SUT SILK 2 0 SH CR/8 (SUTURE) ×3 IMPLANT
SUT SILK 2-0 18XBRD TIE 12 (SUTURE) ×3 IMPLANT
SUT SILK 3 0 SH CR/8 (SUTURE) ×3 IMPLANT
SUT SILK 3-0 18XBRD TIE 12 (SUTURE) ×3 IMPLANT
SUT VIC AB 2-0 SH 18 (SUTURE) ×3 IMPLANT
SYR BULB IRRIG 60ML STRL (SYRINGE) IMPLANT
SYSTEM WOUND ALEXIS 18CM MED (MISCELLANEOUS) IMPLANT
TRAY FOLEY MTR SLVR 16FR STAT (SET/KITS/TRAYS/PACK) ×3 IMPLANT
TROCAR 11X100 Z THREAD (TROCAR) IMPLANT
TROCAR BALLN 12MMX100 BLUNT (TROCAR) IMPLANT
TROCAR Z-THREAD OPTICAL 5X100M (TROCAR) ×3 IMPLANT
TUBING CONNECTING 10 (TUBING) IMPLANT

## 2024-04-13 NOTE — Anesthesia Procedure Notes (Signed)
 Central Venous Catheter Insertion Performed by: Erin Havers, MD, anesthesiologist Start/End5/10/2024 3:35 AM, 04/13/2024 3:45 AM Patient location: OR. Preanesthetic checklist: patient identified, IV checked, site marked, risks and benefits discussed, surgical consent, monitors and equipment checked, pre-op evaluation, timeout performed and anesthesia consent Position: Trendelenburg Hand hygiene performed , maximum sterile barriers used  and Seldinger technique used Catheter size: 8 Fr Total catheter length 16. Central line was placed.Double lumen Procedure performed using ultrasound guided technique. Ultrasound Notes:anatomy identified, needle tip was noted to be adjacent to the nerve/plexus identified, no ultrasound evidence of intravascular and/or intraneural injection and image(s) printed for medical record Attempts: 1 Following insertion, line sutured, dressing applied and Biopatch. Post procedure assessment: blood return through all ports, free fluid flow and no air  Patient tolerated the procedure well with no immediate complications.

## 2024-04-13 NOTE — Plan of Care (Signed)
  Problem: Clinical Measurements: Goal: Ability to maintain clinical measurements within normal limits will improve Outcome: Progressing   Problem: Education: Goal: Knowledge of General Education information will improve Description: Including pain rating scale, medication(s)/side effects and non-pharmacologic comfort measures Outcome: Not Progressing   Problem: Health Behavior/Discharge Planning: Goal: Ability to manage health-related needs will improve Outcome: Not Progressing   Problem: Clinical Measurements: Goal: Will remain free from infection Outcome: Not Progressing Goal: Diagnostic test results will improve Outcome: Not Progressing

## 2024-04-13 NOTE — Progress Notes (Signed)
 Pharmacy Antibiotic Note  Donald Morales is a 68 y.o. male admitted on 04/12/2024 with sepsis/intra-abdominal infection.  Pharmacy has been consulted for Vancomycin and Zosyn dosing.  Plan: Zosyn 3.375g IV q8h (4 hour infusion). Vancomycin 1750 mg IV Q 24 hrs. Goal AUC 400-550.  Expected AUC: 487.4  SCr used: 1.2 Daily SCr while on both Vancomycin and Zosyn   Height: 5\' 10"  (177.8 cm) Weight: 95.1 kg (209 lb 10.5 oz) IBW/kg (Calculated) : 73  Temp (24hrs), Avg:97.1 F (36.2 C), Min:96.4 F (35.8 C), Max:97.7 F (36.5 C)  Recent Labs  Lab 04/12/24 2049 04/12/24 2111 04/12/24 2343 04/13/24 0137  WBC 12.1*  --   --   --   CREATININE 1.18 1.20  --   --   LATICACIDVEN  --   --  2.8* 4.9*    Estimated Creatinine Clearance: 71 mL/min (by C-G formula based on SCr of 1.2 mg/dL).    Allergies  Allergen Reactions   Aspirin     81 mg - epistaxis/gums bleeding   Codeine     hallucinations    Antimicrobials this admission: 5/10 Zosyn >>   5/11 Vancomycin >>    Dose adjustments this admission:    Microbiology results: 5/11 MRSA PCR:    Thank you for allowing pharmacy to be a part of this patient's care.  Rulon Councilman, PharmD 04/13/2024 5:43 AM

## 2024-04-13 NOTE — Consult Note (Addendum)
 Reason for Consult:  Concern for bowel ischemia with possible SMV thrombus Referring Provider: Kermit Ped, MD  HPI  Donald Morales is an 68 y.o. male with history of HTN, thyroid disease, Afib--has not taken his anticoagulation in 2+ years who presents with abdominal pain to [facility].  Patient states he had URI several weeks and then progressed to abdominal cramping and discomfort. Over the past few days he has had progressively worsening abdominal pain. Describes it as cramping. He has not had a solid bowel movement in about 1 week. He states he has been passing minimal flatus. No bright red blood per rectum or melena. Denies fevers. Denies dysuria but has had decreased urine output. Initially when presented to ED denies any nausea or emesis, however immediately after performing by abdominal exam, patient began having violent emesis so severe to cause nose bleed. Per ED RN, after EDP had performed abdominal exam earlier he immediately had emesis as well. Patient endorsed this statement.  CT scan shows bowel wall thickening. There was an issue with obtaining venous phase timing so it is not definitively clear if there is SMV thrombus but per radiologist discussion with EDP, there is concern for this.  Patient does have leukocytosis to 12.1 and an elevated lactate of 2.8 initially, repeat trending up to 4.9.  States no prior abdominal surgery. His last colonoscopy was in 2013 and he had a polyp in ascending colon removed. Path showed serrated adenoma. He states he has not received colonoscopy since because "it is preventative" and he does not want to spend the money required for colonoscopies for surveillance.  10 point review of systems is negative except as listed above in HPI.  Objective  Past Medical History: Past Medical History:  Diagnosis Date   BPH (benign prostatic hyperplasia)    History of epistaxis    Hyperlipidemia    Muscle ache    Persistent atrial fibrillation (HCC)    Premature  ventricular contraction    RBBB    RBBB    Thyroid disease    Typical atrial flutter (HCC)     Past Surgical History: Past Surgical History:  Procedure Laterality Date   BACK SURGERY     ORCHIECTOMY     TOOTH EXTRACTION      Family History:  Family History  Problem Relation Age of Onset   Heart disease Mother    Alcoholism Father     Social History:  reports that he has never smoked. He has never used smokeless tobacco. He reports current alcohol use. He reports that he does not use drugs.  Allergies:  Allergies  Allergen Reactions   Aspirin     81 mg - epistaxis/gums bleeding   Codeine     hallucinations    Medications: I have reviewed the patient's current medications.  Labs: I have personally reviewed all labs for the past 24h  Imaging: I have personally reviewed and interpreted all imaging for the past 24h and agree with the radiologist's impression.  No results found.   Physical Exam Blood pressure (!) 134/104, pulse 81, temperature 97.7 F (36.5 C), temperature source Oral, resp. rate (!) 27, height 5\' 10"  (1.778 m), weight 87.5 kg, SpO2 99%. Constitutional: well-developed, well-nourished HEENT: pupils equal, round, reactive to light, moist conjunctiva, hearing intact Oropharynx: mucous membranes moist CV: Sinus tachycardia, normotensive Chest: equal chest rise bilaterally normal respiratory effort on room air Abdomen: soft, mild to moderately distended, diffusely tender with palpation Back:  Extremities: moves all extremities, no  peripheral edema Skin: warm, dry, no rashes Psych: normal memory, normal mood/affect  Neuro: No focal neurologic deficits, A&Ox3    Assessment   Donald Morales is an 68 y.o. male who presents with abdominal pain for several days. Concern for possible bowel ischemia and possible SMV thrombus.  Plan  - OR for diagnostic laparoscopic, possible exploratory laparotomy, possible bowel resection, possible temporary abdominal  closure with wound vac. - I discussed risks of surgery with patient including but not limited to: bleeding, infection, bowel resection, possible ostomy, need for multiple operations to reevaluate bowel viability. We discussed that I would start with diagnostic laparoscopy but if any concern for bowel compromise would convert to laparotomy. We discussed that if there any questionable viability of bowel would leave temporary abdominal closure device and return to OR in 24-48h. If this was the case, patient would remain intubated post-op. - I discussed with patient who he would want to be his medical decision maker in event that he could not make decisions for himself. He states he would want his sister to be his medical decision Meda Spies [phone]). For tonight, he is accompanied by his neighbor, Schuyler Custard, and would like her to be contacted after OR to discuss intraooperative findings and plan.  I reviewed ED provider notes, last 24 h vitals and pain scores, last 48 h intake and output, last 24 h labs and trends, and last 24 h imaging results.  This care required high  level of medical decision making.   Freddrick Jaffe, MD [facility]

## 2024-04-13 NOTE — Progress Notes (Signed)
 Pharmacy Antibiotic Note  Donald Morales is a 68 y.o. male admitted on 04/12/2024 with sepsis/intra-abdominal infection.  Pharmacy has been consulted for Vancomycin and Zosyn dosing.  Plan: Continue Zosyn 3.375g IV q8h (4 hour infusion). Change Vancomycin to 1500 mg IV Q 24 hrs (Goal AUC 400-550, eAUC: 508.3,  Vd: 0.5, SCr used: 1.06) Daily SCr while on both Vancomycin and Zosyn   Height: 5\' 9"  (175.3 cm) Weight: 95.1 kg (209 lb 10.5 oz) IBW/kg (Calculated) : 70.7  Temp (24hrs), Avg:97.1 F (36.2 C), Min:96.4 F (35.8 C), Max:97.7 F (36.5 C)  Recent Labs  Lab 04/12/24 2049 04/12/24 2111 04/12/24 2343 04/13/24 0137 04/13/24 0612  WBC 12.1*  --   --   --  11.4*  CREATININE 1.18 1.20  --   --  1.06  LATICACIDVEN  --   --  2.8* 4.9* 2.4*    Estimated Creatinine Clearance: 79.1 mL/min (by C-G formula based on SCr of 1.06 mg/dL).    Allergies  Allergen Reactions   Aspirin     81 mg - epistaxis/gums bleeding   Codeine     hallucinations    Antimicrobials this admission: 5/10 Zosyn >>   5/11 Vancomycin >>    Microbiology results: 5/11 MRSA PCR:  not detected  Thank you for allowing pharmacy to be a part of this patient's care.  Delpha Fickle, PharmD, BCPS 04/13/2024 7:31 AM

## 2024-04-13 NOTE — Progress Notes (Signed)
  Echocardiogram 2D Echocardiogram has been performed.  [place] Revoir L Essam Lowdermilk RDCS 04/13/2024, 1:25 PM

## 2024-04-13 NOTE — Anesthesia Procedure Notes (Signed)
 Procedure Name: Intubation Date/Time: 04/13/2024 3:26 AM  Performed by: Erin Havers, MDPre-anesthesia Checklist: Patient identified, Emergency Drugs available, Suction available and Patient being monitored Patient Re-evaluated:Patient Re-evaluated prior to induction Oxygen Delivery Method: Circle system utilized Preoxygenation: Pre-oxygenation with 100% oxygen Induction Type: IV induction and Rapid sequence Ventilation: Mask ventilation without difficulty Laryngoscope Size: Mac and 4 Grade View: Grade I Tube type: Oral Tube size: 8.0 mm Number of attempts: 1 Airway Equipment and Method: Stylet and Oral airway Placement Confirmation: ETT inserted through vocal cords under direct vision, positive ETCO2 and breath sounds checked- equal and bilateral Secured at: 22 cm Tube secured with: Tape Dental Injury: Teeth and Oropharynx as per pre-operative assessment

## 2024-04-13 NOTE — Progress Notes (Signed)
 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  Allergies  Allergen Reactions   Aspirin     81 mg - epistaxis/gums bleeding   Codeine     hallucinations    Patient Measurements: Height: 5\' 9"  (175.3 cm) Weight: 95.1 kg (209 lb 10.5 oz) IBW/kg (Calculated) : 70.7 HEPARIN DW (KG): 92.4  Vital Signs: Temp: 96.9 F (36.1 C) (05/11 2000) Temp Source: Axillary (05/11 2000) BP: 110/75 (05/11 1900) Pulse Rate: 105 (05/11 2000)  Labs: Recent Labs    04/12/24 2049 04/12/24 2111 04/13/24 0612 04/13/24 1120 04/13/24 1608 04/13/24 2022  HGB 10.7*  --  9.3*  --   --   --   HCT 35.4*  --  30.8*  --   --   --   PLT 234  --  186  --   --   --   HEPARINUNFRC  --   --   --  0.14*  --  0.44  CREATININE 1.18 1.20 1.06  --  1.12  --     Estimated Creatinine Clearance: 74.9 mL/min (by C-G formula based on SCr of 1.12 mg/dL).   Medical History: Past Medical History:  Diagnosis Date   BPH (benign prostatic hyperplasia)    History of epistaxis    Hyperlipidemia    Muscle ache    Persistent atrial fibrillation (HCC)    Premature ventricular contraction    RBBB    RBBB    Thyroid disease    Typical atrial flutter (HCC)     Medications:  No oral anticoagulation PTA Has been on Eliquis  in the past for AFib but reports has been off meds x 2 years  Assessment: 68 yr male with abdominal pain and AFib with RVR.   CTAngio noted for concern of SMV thrombosis PMH significant for AFib (reports off medications for past 2 years).  04/13/24 Patient received Heparin 4000 unit IV bolus as ordered earlier @ 0126 and then heparin infusion orders were d/c'ed as patient was taken to the OR for exploratory/diagnostic laparoscopy for small bowel ischemia.  Surgeon's note OK's to start heparin immediately postop once in the ICU.  5/11 PM: Heparin level now therapeutic at 0.44 with IV heparin  infusing at 1350 units/hr No bleeding or other complications of therapy per nurse other than normal serosanguineous drainage from wound vanc  Goal of Therapy:  Heparin level 0.3-0.7 units/ml Monitor platelets by anticoagulation protocol: Yes   Plan:  Continue IV heparin at 1350 units/hr Monitor daily CBC, signs/symptoms of bleeding. Will hold off on ordering AM heparin level 5/12 since heparin off at 0400. Per Surgery, stop IV heparin tomorrow morning at 0400 for exploratory laparotomy (end date in)   Thank you for allowing pharmacy to be a part of this patient's care.  Roselee Cong, PharmD Clinical Pharmacist  5/11/20259:14 PM

## 2024-04-13 NOTE — Progress Notes (Signed)
 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  Allergies  Allergen Reactions   Aspirin     81 mg - epistaxis/gums bleeding   Codeine     hallucinations    Patient Measurements: Height: 5\' 9"  (175.3 cm) Weight: 95.1 kg (209 lb 10.5 oz) IBW/kg (Calculated) : 70.7 HEPARIN DW (KG): 92.4  Vital Signs: Temp: 97.7 F (36.5 C) (05/11 0755) Temp Source: Oral (05/11 0755) BP: 111/74 (05/11 0900) Pulse Rate: 84 (05/11 0900)  Labs: Recent Labs    04/12/24 2049 04/12/24 2111 04/13/24 0612 04/13/24 1120  HGB 10.7*  --  9.3*  --   HCT 35.4*  --  30.8*  --   PLT 234  --  186  --   HEPARINUNFRC  --   --   --  0.14*  CREATININE 1.18 1.20 1.06  --     Estimated Creatinine Clearance: 79.1 mL/min (by C-G formula based on SCr of 1.06 mg/dL).   Medical History: Past Medical History:  Diagnosis Date   BPH (benign prostatic hyperplasia)    History of epistaxis    Hyperlipidemia    Muscle ache    Persistent atrial fibrillation (HCC)    Premature ventricular contraction    RBBB    RBBB    Thyroid disease    Typical atrial flutter (HCC)     Medications:  No oral anticoagulation PTA Has been on Eliquis  in the past for AFib but reports has been off meds x 2 years  Assessment: 68 yr male with abdominal pain and AFib with RVR.   CTAngio noted for concern of SMV thrombosis PMH significant for AFib (reports off medications for past 2 years).  04/13/24 Patient received Heparin 4000 unit IV bolus as ordered earlier @ 0126 and then heparin infusion orders were d/c'ed as patient was taken to the OR for exploratory/diagnostic laparoscopy for small bowel ischemia.  Surgeon's note OK's to start heparin immediately postop once in the ICU.  5/11 11:20 Heparin level low at 0.14 with IV heparin infusing at 1100 units/hr No bleeding or other complications of therapy per nurse other than  normal serosanguineous drainage from wound vac  Goal of Therapy:  Heparin level 0.3-0.7 units/ml Monitor platelets by anticoagulation protocol: Yes   Plan:  Administer 2500 units IV heparin bolus via infusion then increase IV heparin continuous infusion to 1350 units/hr Check heparin level 6 hr after rate increase Monitor daily heparin level when at goal, CBC, signs/symptoms of bleeding Per Surgery, stop IV heparin tomorrow morning at 0400 for exploratory laparotomy   Thank you for allowing pharmacy to be a part of this patient's care.  Alfredo Inch, PharmD, BCPS Clinical Pharmacist  04/13/2024 12:40 PM

## 2024-04-13 NOTE — Op Note (Signed)
 Date of Surgery: 04/12/2024 - 04/13/2024 Admit Date: 04/12/2024 Performing Service: General Surgeons and Role:    Edmon Gosling, MD - Primary  Preoperative Diagnosis: Bowel ischemia  Postoperative Diagnosis: small bowel ischemia  Procedure(s) Performed:  - Foley catheter placement - Diagnostic laparoscopy - Exploratory laparotomy - Small bowel resection  Surgeon:    Freddrick Jaffe, MD  Anesthesia:    General endotracheal.  Specimens:  Small bowel  Estimated Blood Loss:   Minimal.  Indications for Surgery:   This is a 68 y.o. male  who presented to us  with a several-day history of abdominal pain, constipation, with worsening abdominal pain this evening now with nausea and emesis. CT scan concerning for thickened bowel, possible SMV thrombus, lactate continuing to rise to 4.9 and constellation of findings and symptoms concerning for bowel ischemia.  Operative Findings: Area of necrotic appearing bowel noted on diagnostic laparoscopy and thus conversion to open. 40cm of necrotic small bowel resected, There was obvious thrombus within the small bowel mesenteric veins. Some thrombus removed with mesentery. There were strong palpable arterial pulses throughout mesentery. An additional ~75cm small bowel appeared mildly dusky but not grossly ischemic so decision was made to leave in discontinuity and place Abthera with plans to return to OR in 24-48h.  Procedure:  After obtaining consent from the patient, the patient was taken to the operating room and laid supine on the operating table.  The patient was then placed under general endotracheal anesthesia.  A Foley catheter was placed in the usual standard fashion.   The anterior abdominal wall was prepped and draped in the usual standard fashion. A small incision was made in the LUQ at Palmer's point and a veress needle was inserted. Air was aspirated and subsequent positive drop test. Abdomen then insufflated to . A supraumbilical  incision was then made and a 5mm trocar optiview using a 30 degree scope was inserted and the abdomen was entered under direct visualization. Inspection confirmed no evidence of trocar or veress site complications. The veress was then removed.  Diagnostic laparoscopy was performed.  There was a large amount of omentum covering almost all the bowel so unable to clearly evaluate. An additional 5mm trocar was placed in the LLQ and a bowel retractor used to sweep away omentum. There was an obvious area of purple, necrotic appearing small bowel and at this point the decision was made to convert to laparotomy.  The supraumbilical incision was extended into a laparotomy incision. Dissection carried down through subcutaneous tissue, fascia and peritoneum and extended along length of skin incision. A wound protector was placed. The omentum and small bowel were then eviscerated. The small bowel was run from the Ligament of Treitz to the terminal ileocecal valve. A 40cm segment of small bowel was edematous, thickened, purple, and necrotic appearing.  A point proximally and distally to the necrotic segment was chosen without obvious necrosis or impending necrosis, although still dusky, and a window made in the mesentery. A blue load of GIA stapler used to transect small bowel at each level. A ligasure was then used to divide the mesentery. The mesentery was edematous and obvious thrombus expelled as mesentery divided. The divided edge of mesentery was reexamined and a combination of both ligasure and interrupted silk suture was used to reinforce hemostasis--not much bleeding prior to this but reinforced prophylactically given patient will need heparin gtt post op. Colon was also examined and appeared healthy.   Abdomen then copiously irrigated. Wound protector removed and abthera wound  vac placed. A single interrupted stitch was placed on the LLQ trocar site and covered with gauze and tegaderm.   Instrument, sponge, and  needle counts were correct at closure and at the conclusion of the case.   Post-op Plan: - Given high risk of holding heparin gtt causing further bowel ischemia, ok to start immediately post-op in ICU where WV output can closely be monitored - Continue Zosyn - NGT to LIWS - WV to 125mmHg continuous suction - Strict NPO, no enteral meds. - Rest of care per ICU team.   Freddrick Jaffe, MD [facility]

## 2024-04-13 NOTE — Transfer of Care (Signed)
 Immediate Anesthesia Transfer of Care Note  Patient: Donald Morales  Procedure(s) Performed: LAPAROSCOPY, DIAGNOSTIC LAPAROTOMY EXCISION, SMALL INTESTINE APPLICATION, WOUND VAC  Patient Location: ICU  Anesthesia Type:General  Level of Consciousness: sedated  Airway & Oxygen Therapy: Patient remains intubated per anesthesia plan  Post-op Assessment: Report given to RN and Post -op Vital signs reviewed and stable  Post vital signs: Reviewed and stable  Last Vitals:  Vitals Value Taken Time  BP 132/95 04/13/24 0517  Temp    Pulse 65 04/13/24 0533  Resp 23 04/13/24 0533  SpO2 100 % 04/13/24 0533  Vitals shown include unfiled device data.  Last Pain:  Vitals:   04/13/24 0154  TempSrc:   PainSc: 7          Complications: No notable events documented.

## 2024-04-13 NOTE — Progress Notes (Signed)
 eLink Physician-Brief Progress Note Patient Name: Donald Morales DOB: [birth year] MRN: [id number]   Date of Service  04/13/2024  HPI/Events of Note  68 y.o. male with history of HTN, thyroid disease, Afib--has not taken his anticoagulation in 2+ years who presents with abdominal pain to [facility].  Patient taken to the operating room with small bowel resection and return to the ICU mechanically ventilated.  Patient is somewhat bradycardic but saturating 100% on the ventilator.  Currently running on diltiazem  drip with plans to initiate heparin infusion.  Results with transaminitis, elevated lactic acid, leukocytosis.  TSH elevated, T4 pending.  eICU Interventions  Maintain Zosyn, completed vancomycin  Diltiazem  for rate control.  Initiate heparin infusion per pharmacy.  Continue IVF for now  Mechanical ventilation, chest and abdominal radiographs to verify the tubes and lines.  Will adjust ventilator with ABG.  Daily spontaneous awakening/breathing trials  Maintain MAP greater than 65 with phenylephrine  DVT prophylaxis with therapeutic heparin GI prophylaxis with Pepcid     Intervention Category Evaluation Type: New Patient Evaluation  Megan Presti 04/13/2024, 5:25 AM

## 2024-04-13 NOTE — Progress Notes (Signed)
 NAME:  Donald Morales, MRN:  [id number], DOB:  May 14, 1956, LOS: 0 ADMISSION DATE:  04/12/2024, CONSULTATION DATE:  04/13/2024 REFERRING MD:  Iva Mariner, MD CHIEF COMPLAINT:  Abdominal pain   History of Present Illness:  68 year old male with a history of hypertension, thyroid disease, A-fib not taking Eliquis  or other anti-coagulation for at least 2 years, presents with abdominal pain. He states that a few weeks ago he had a URI that progressed to his abdomen. At first it seemed like it was due to his cough but over the past few days he has been having progressively worsening abdominal pain. It feels like a cramping sensation. He has been constipated without a solid bowel movement in about 5 days that he is had real small bowel movements. He has not had any fevers or vomiting. He has been having decreased urine output without dysuria.  He describes cramping abdominal pain x 1 week.    CT scan shows bowel wall thickening. There was an issue with obtaining venous phase timing so it is not definitively clear if there is SMV thrombus but per radiologist discussion with EDP, there is concern for this. Patient being taken to OR.  Pertinent  Medical History  Atrial Fibrillation  Significant Hospital Events: Including procedures, antibiotic start and stop dates in addition to other pertinent events   5/11 taken to OR for bowel ischemia, s/p laparoscopic abdominal surgery converted to open for necrotic small bowel - 40cm small bowel resected and notable mesenteric vein thromboses. Left in discontinuity to return to OR in 24-48hrs. Admitted to ICU post op  Interim History / Subjective:   No acute issues since arrival from OR  Objective    Blood pressure (!) 132/95, pulse (!) 58, temperature (!) 96.4 F (35.8 C), resp. rate 20, height 5\' 9"  (1.753 m), weight 95.1 kg, SpO2 100%.    Vent Mode: PRVC FiO2 (%):  [40 %-90 %] 40 % Set Rate:  [20 bmp] 20 bmp Vt Set:  [420 mL-580 mL] 420 mL PEEP:  [5  cmH20] 5 cmH20 Plateau Pressure:  [18 cmH20] 18 cmH20   Intake/Output Summary (Last 24 hours) at 04/13/2024 0749 Last data filed at 04/13/2024 1914 Gross per 24 hour  Intake 2401.4 ml  Output 230 ml  Net 2171.4 ml   Filed Weights   04/12/24 2041 04/13/24 0536  Weight: 87.5 kg 95.1 kg    Examination: General: intubated/sedated, no distress HENT: /AT, ET tube in place Lungs: clear to auscultation, no wheezing Cardiovascular: irregularly irregular Abdomen: midline dressing in place Extremities: warm, no edema Neuro: sedated, PERRL GU: foley  Resolved Hospital Problem list     Assessment & Plan:   Shock - lactic acid is trending down - on vancomycin and zosyn - continue phenylephrine for now given a. Fib rvr. 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, left in discontinuity with plan to return to OR tomorrow - management per surgery - on heparin drip, stop at 0400 on 5/12 for return trip to the OR  Post-op Ventilator Management - continue ventilator support - fentanyl and propofol for sedation - No SBT/SAT today   Atrial Fibrillation with RVR Chronic Systolic Heart Failure - diltiazem  drip for rate control as needed - heparin drip, will stop at 0400 on 5/12 for return trip to the OR - check limited echo  Mild Hyponatremia Non-anion gap Metabolic Acidosis Lactic  Acidosis - on NS 113mL/hr - trend lactic acid and BMP  Anemia - transfuse for hemoglobin 7g/dL or less  Hyperglycemia - SSI  Hx of hypothyroidism - tsh 4.6  Best Practice (right click and "Reselect all SmartList Selections" daily)   Diet/type: NPO DVT prophylaxis systemic heparin Pressure ulcer(s): pressure ulcer assessment deferred to nursing assessment GI prophylaxis: PPI Lines: Central line Foley:  Yes, and it is still needed Code Status:  full code Last date of  multidisciplinary goals of care discussion [pending]  Labs   CBC: Recent Labs  Lab 04/12/24 2049 04/13/24 0612  WBC 12.1* 11.4*  NEUTROABS 10.5*  --   HGB 10.7* 9.3*  HCT 35.4* 30.8*  MCV 76.3* 74.9*  PLT 234 186    Basic Metabolic Panel: Recent Labs  Lab 04/12/24 2049 04/12/24 2111 04/12/24 2343 04/13/24 0612  NA 132*  --   --  132*  K 4.0  --   --  4.5  CL 101  --   --  104  CO2 18*  --   --  19*  GLUCOSE 186*  --   --  179*  BUN 24*  --   --  24*  CREATININE 1.18 1.20  --  1.06  CALCIUM 8.6*  --   --  8.3*  MG  --   --  2.2 2.1  PHOS  --   --   --  4.9*   GFR: Estimated Creatinine Clearance: 79.1 mL/min (by C-G formula based on SCr of 1.06 mg/dL). Recent Labs  Lab 04/12/24 2049 04/12/24 2343 04/13/24 0137 04/13/24 0612  WBC 12.1*  --   --  11.4*  LATICACIDVEN  --  2.8* 4.9* 2.4*    Liver Function Tests: Recent Labs  Lab 04/12/24 2049 04/13/24 0612  AST 125* 77*  ALT 195* 142*  ALKPHOS 87 65  BILITOT 1.6* 1.7*  PROT 7.3 6.1*  ALBUMIN 3.7 3.3*   Recent Labs  Lab 04/12/24 2049  LIPASE 52*   No results for input(s): "AMMONIA" in the last 168 hours.  ABG    Component Value Date/Time   PHART 7.44 04/13/2024 0558   PCO2ART 28 (L) 04/13/2024 0558   PO2ART 348 (H) 04/13/2024 0558   HCO3 19.5 (L) 04/13/2024 0558   ACIDBASEDEF 3.9 (H) 04/13/2024 0558   O2SAT 100 04/13/2024 0558     Coagulation Profile: No results for input(s): "INR", "PROTIME" in the last 168 hours.  Cardiac Enzymes: No results for input(s): "CKTOTAL", "CKMB", "CKMBINDEX", "TROPONINI" in the last 168 hours.  HbA1C: No results found for: "HGBA1C"  CBG: Recent Labs  Lab 04/13/24 0618 04/13/24 0740  GLUCAP 162* 171*      Critical care time: 40 minutes    Duaine German, MD [facility] Office: [phone]   See Amion for personal pager PCCM on call pager [phone] until 7pm. Please call Elink 7p-7a. [phone]

## 2024-04-13 NOTE — Anesthesia Preprocedure Evaluation (Addendum)
 Anesthesia Evaluation  Patient identified by MRN, date of birth, ID band Patient awake    Reviewed: Allergy & Precautions, NPO status , Patient's Chart, lab work & pertinent test results  Airway Mallampati: III  TM Distance: <3 FB Neck ROM: Full    Dental  (+) Teeth Intact, Dental Advisory Given   Pulmonary neg pulmonary ROS   Pulmonary exam normal breath sounds clear to auscultation       Cardiovascular hypertension, + dysrhythmias (RBBB) Atrial Fibrillation  Rhythm:Irregular Rate:Abnormal     Neuro/Psych  PSYCHIATRIC DISORDERS  Depression    negative neurological ROS     GI/Hepatic Neg liver ROS,GERD  ,,possible necrotic bowel   Endo/Other  Hypothyroidism    Renal/GU negative Renal ROS     Musculoskeletal negative musculoskeletal ROS (+)    Abdominal   Peds  Hematology  (+) Blood dyscrasia, anemia   Anesthesia Other Findings Day of surgery medications reviewed with the patient.  Not taking his amiodarone , eliquis  as prescribed   Reproductive/Obstetrics                             Anesthesia Physical Anesthesia Plan  ASA: 4 and emergent  Anesthesia Plan: General   Post-op Pain Management: Ofirmev IV (intra-op)*   Induction: Intravenous, Rapid sequence and Cricoid pressure planned  PONV Risk Score and Plan: 2 and Dexamethasone and Ondansetron  Airway Management Planned: Oral ETT  Additional Equipment: Arterial line, CVP and Ultrasound Guidance Line Placement  Intra-op Plan:   Post-operative Plan: Possible Post-op intubation/ventilation  Informed Consent: I have reviewed the patients History and Physical, chart, labs and discussed the procedure including the risks, benefits and alternatives for the proposed anesthesia with the patient or authorized representative who has indicated his/her understanding and acceptance.     Dental advisory given  Plan Discussed with:  CRNA  Anesthesia Plan Comments:        Anesthesia Quick Evaluation

## 2024-04-13 NOTE — H&P (Signed)
 NAME:  Donald Morales, MRN:  [id number], DOB:  [birth year], LOS: 0 ADMISSION DATE:  04/12/2024, CONSULTATION DATE:  04/13/2024 REFERRING MD:  Iva Mariner, MD, CHIEF COMPLAINT:  Abdomnial pain  History of Present Illness:  68 year old male with a history of hypertension, thyroid disease, A-fib not taking Eliquis  or other anti-coagulation for at least 2 years, presents with abdominal pain. He states that a few weeks ago he had a URI that progressed to his abdomen. At first it seemed like it was due to his cough but over the past few days he has been having progressively worsening abdominal pain. It feels like a cramping sensation. He has been constipated without a solid bowel movement in about 5 days that he is had real small bowel movements. He has not had any fevers or vomiting. He has been having decreased urine output without dysuria.  He describes cramping abdominal pain x 1 week.   CT scan shows bowel wall thickening. There was an issue with obtaining venous phase timing so it is not definitively clear if there is SMV thrombus but per radiologist discussion with EDP, there is concern for this. Patient being taken to OR when I saw him in ED. Pertinent  Medical History  BPH (benign prostatic hyperplasia), History of epistaxis, Hyperlipidemia, Muscle ache, Persistent atrial fibrillation (HCC), Premature ventricular contraction, RBBB, RBBB, Thyroid disease, and Typical atrial flutter (HCC).   Significant Hospital Events: Including procedures, antibiotic start and stop dates in addition to other pertinent events   5/11: admit to ICU from OR  Interim History / Subjective:  N/a  Objective    Blood pressure (!) 144/109, pulse (!) 47, temperature 97.7 F (36.5 C), temperature source Oral, resp. rate 19, height 5\' 10"  (1.778 m), weight 87.5 kg, SpO2 100%.        Intake/Output Summary (Last 24 hours) at 04/13/2024 0515 Last data filed at 04/13/2024 0447 Gross per 24 hour  Intake 2086.24 ml  Output  230 ml  Net 1856.24 ml   Filed Weights   04/12/24 2041  Weight: 87.5 kg    Examination: Prior to OR General: alert Awake NAD HENT: PERRLA no icterus EOMI, mmm Lungs: CTA no wheezes no rales no rhonchi Cardiovascular: reg s1s2, tachy Abdomen: distended and tender to palpitation no guarding Extremities: no cyanosis, clubbing or edema Neuro: AAO x 3, CN II to XII grossly intact   Resolved Hospital Problem list   N/a  Assessment & Plan:  Abd pain secondary to SMV thrombus secondary to A fib not on anticoagulation Patient post op now s/p partial small bowel resection with open abd Plan for OR again in the next 24-48 hours Full course anticoagulation-Heparin drip Broad spectrum antibiotics Acute respiratory failure Remains intubated post op Vent management, follow ABG Atrial fibrillation Patient to be started on IV heparin for the SMV occlusion This will also cover the A fib Rate control as required Admit to ICU Best Practice (right click and "Reselect all SmartList Selections" daily)   Diet/type: NPO DVT prophylaxis systemic heparin Pressure ulcer(s): N/A GI prophylaxis: H2B Lines: Central line and Arterial Line Foley:  Yes, and it is still needed Code Status:  full code   Labs   CBC: Recent Labs  Lab 04/12/24 2049  WBC 12.1*  NEUTROABS 10.5*  HGB 10.7*  HCT 35.4*  MCV 76.3*  PLT 234    Basic Metabolic Panel: Recent Labs  Lab 04/12/24 2049 04/12/24 2111 04/12/24 2343  NA 132*  --   --  K 4.0  --   --   CL 101  --   --   CO2 18*  --   --   GLUCOSE 186*  --   --   BUN 24*  --   --   CREATININE 1.18 1.20  --   CALCIUM 8.6*  --   --   MG  --   --  2.2   GFR: Estimated Creatinine Clearance: 63.4 mL/min (by C-G formula based on SCr of 1.2 mg/dL). Recent Labs  Lab 04/12/24 2049 04/12/24 2343 04/13/24 0137  WBC 12.1*  --   --   LATICACIDVEN  --  2.8* 4.9*    Liver Function Tests: Recent Labs  Lab 04/12/24 2049  AST 125*  ALT 195*   ALKPHOS 87  BILITOT 1.6*  PROT 7.3  ALBUMIN 3.7   Recent Labs  Lab 04/12/24 2049  LIPASE 52*   No results for input(s): "AMMONIA" in the last 168 hours.  ABG No results found for: "PHART", "PCO2ART", "PO2ART", "HCO3", "TCO2", "ACIDBASEDEF", "O2SAT"   Coagulation Profile: No results for input(s): "INR", "PROTIME" in the last 168 hours.  Cardiac Enzymes: No results for input(s): "CKTOTAL", "CKMB", "CKMBINDEX", "TROPONINI" in the last 168 hours.  HbA1C: No results found for: "HGBA1C"  CBG: No results for input(s): "GLUCAP" in the last 168 hours.  Review of Systems:   Abdominal pain  Past Medical History:  He,  has a past medical history of BPH (benign prostatic hyperplasia), History of epistaxis, Hyperlipidemia, Muscle ache, Persistent atrial fibrillation (HCC), Premature ventricular contraction, RBBB, RBBB, Thyroid disease, and Typical atrial flutter (HCC).   Surgical History:   Past Surgical History:  Procedure Laterality Date   BACK SURGERY     ORCHIECTOMY     TOOTH EXTRACTION       Social History:   reports that he has never smoked. He has never used smokeless tobacco. He reports current alcohol use. He reports that he does not use drugs.   Family History:  His family history includes Alcoholism in his father; Heart disease in his mother.   Allergies Allergies  Allergen Reactions   Aspirin     81 mg - epistaxis/gums bleeding   Codeine     hallucinations     Home Medications  Prior to Admission medications   Medication Sig Start Date End Date Taking? Authorizing Provider  albuterol (VENTOLIN HFA) 108 (90 Base) MCG/ACT inhaler Inhale 2 puffs into the lungs every 6 (six) hours as needed for wheezing or shortness of breath. 04/07/24  Yes [provider]  doxycycline (VIBRAMYCIN) 100 MG capsule Take 1 capsule by mouth 2 (two) times daily. 04/07/24  Yes [provider]  levothyroxine (SYNTHROID) 50 MCG tablet Take 50 mcg by mouth daily before  breakfast.   Yes [provider]  predniSONE (DELTASONE) 50 MG tablet Take 50 mg by mouth daily. 04/07/24  Yes [provider]  amiodarone  (PACERONE ) 200 MG tablet Take 1 tablet (200 mg total) by mouth 2 (two) times daily. Please call [phone] to schedule an overdue appointment for future refills. Thank you. 1st attempt. Patient not taking: Reported on 10/17/2022 08/10/22   Lei Pump, MD  apixaban  (ELIQUIS ) 5 MG TABS tablet Take 1 tablet (5 mg total) by mouth 2 (two) times daily. Patient not taking: Reported on 04/13/2024 02/09/22   Jolly Needle, MD  diltiazem  (CARDIZEM  CD) 240 MG 24 hr capsule Take 1 capsule (240 mg total) by mouth in the morning and at bedtime. 01/26/22  10/17/22  Allred, Royston Cornea, MD     Critical care time: 1   The patient is critically ill with multiple organ system failure and requires high complexity decision making for assessment and support, frequent evaluation and titration of therapies, advanced monitoring, review of radiographic studies and interpretation of complex data.   Critical Care Time devoted to patient care services, exclusive of separately billable procedures, described in this note is 35 minutes.   Claven Cumming, MD [facility] See Amion for pager  If no response to pager , please call [phone] until 7pm After 7:00 pm call Elink  [phone] 04/13/2024, 5:15 AM

## 2024-04-13 NOTE — Progress Notes (Signed)
 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  Allergies  Allergen Reactions   Aspirin     81 mg - epistaxis/gums bleeding   Codeine     hallucinations    Patient Measurements: Height: 5\' 10"  (177.8 cm) Weight: 87.5 kg (193 lb) IBW/kg (Calculated) : 73 HEPARIN DW (KG): 87.5  Vital Signs: Temp: 97.7 F (36.5 C) (05/10 2304) Temp Source: Oral (05/10 2304) BP: 134/104 (05/11 0045) Pulse Rate: 81 (05/11 0045)  Labs: Recent Labs    04/12/24 2049 04/12/24 2111  HGB 10.7*  --   HCT 35.4*  --   PLT 234  --   CREATININE 1.18 1.20    Estimated Creatinine Clearance: 63.4 mL/min (by C-G formula based on SCr of 1.2 mg/dL).   Medical History: Past Medical History:  Diagnosis Date   BPH (benign prostatic hyperplasia)    History of epistaxis    Hyperlipidemia    Muscle ache    Persistent atrial fibrillation (HCC)    Premature ventricular contraction    RBBB    RBBB    Thyroid disease    Typical atrial flutter (HCC)     Medications:  No oral anticoagulation PTA Has been on Eliquis  in the past for AFib but reports has been off meds x 2 years  Assessment: 68 yr male with abdominal pain and AFib with RVR.   CTAngio noted for concern of SMV thrombosis PMH significant for AFib (reports off medications for past 2 years).  Goal of Therapy:  Heparin level 0.3-0.7 units/ml Monitor platelets by anticoagulation protocol: Yes   Plan:  Heparin 4000 unit IV bolus x 1 Heparin gtt @ 1400 units/hr Check heparin level 6 hr after heparin started Daily heparin level & CBC   Shady Padron Trefz, PharmD 04/13/2024,1:07 AM

## 2024-04-13 NOTE — Anesthesia Procedure Notes (Signed)
 Arterial Line Insertion Start/End5/10/2024 3:28 AM, 04/13/2024 3:33 AM Performed by: Erin Havers, MD, anesthesiologist  Patient location: OR. Preanesthetic checklist: patient identified, IV checked, site marked, risks and benefits discussed, surgical consent, monitors and equipment checked, pre-op evaluation, timeout performed and anesthesia consent Left, radial was placed Catheter size: 20 G Hand hygiene performed  and maximum sterile barriers used   Attempts: 1 Procedure performed using ultrasound guided technique. Ultrasound Notes:anatomy identified, needle tip was noted to be adjacent to the nerve/plexus identified, no ultrasound evidence of intravascular and/or intraneural injection and image(s) printed for medical record Following insertion, dressing applied and Biopatch. Post procedure assessment: normal and unchanged  Patient tolerated the procedure well with no immediate complications.

## 2024-04-13 NOTE — Progress Notes (Addendum)
 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  Allergies  Allergen Reactions   Aspirin     81 mg - epistaxis/gums bleeding   Codeine     hallucinations    Patient Measurements: Height: 5\' 10"  (177.8 cm) Weight: 87.5 kg (193 lb) IBW/kg (Calculated) : 73 HEPARIN DW (KG): 87.5  Vital Signs: Temp: 97.7 F (36.5 C) (05/10 2304) Temp Source: Oral (05/10 2304) BP: 144/109 (05/11 0230) Pulse Rate: 47 (05/11 0230)  Labs: Recent Labs    04/12/24 2049 04/12/24 2111  HGB 10.7*  --   HCT 35.4*  --   PLT 234  --   CREATININE 1.18 1.20    Estimated Creatinine Clearance: 63.4 mL/min (by C-G formula based on SCr of 1.2 mg/dL).   Medical History: Past Medical History:  Diagnosis Date   BPH (benign prostatic hyperplasia)    History of epistaxis    Hyperlipidemia    Muscle ache    Persistent atrial fibrillation (HCC)    Premature ventricular contraction    RBBB    RBBB    Thyroid disease    Typical atrial flutter (HCC)     Medications:  No oral anticoagulation PTA Has been on Eliquis  in the past for AFib but reports has been off meds x 2 years  Assessment: 68 yr male with abdominal pain and AFib with RVR.   CTAngio noted for concern of SMV thrombosis PMH significant for AFib (reports off medications for past 2 years).  04/13/24 Patient received Heparin 4000 unit IV bolus as ordered earlier @ 0126 and then heparin infusion orders were d/c'ed as patient was taken to the OR for exploratory/diagnostic laparoscopy for small bowel ischemia.  Surgeon's note OK's to start heparin immediately postop once in the ICU.  Goal of Therapy:  Heparin level 0.3-0.7 units/ml Monitor platelets by anticoagulation protocol: Yes   Plan:  Heparin gtt @ 1100 units/hr (initially started at a lower rate due to initiation immediately post-op); no bolus when starting infusion per CCM Check  heparin level 6 hr after heparin started Daily heparin level & CBC   Marcelis Wissner Trefz, PharmD 04/13/2024,5:32 AM

## 2024-04-13 NOTE — Anesthesia Postprocedure Evaluation (Signed)
 Anesthesia Post Note  Patient: Donald Morales  Procedure(s) Performed: LAPAROSCOPY, DIAGNOSTIC LAPAROTOMY EXCISION, SMALL INTESTINE APPLICATION, WOUND VAC     Patient location during evaluation: SICU Anesthesia Type: General Level of consciousness: sedated Pain management: pain level controlled Vital Signs Assessment: post-procedure vital signs reviewed and stable Respiratory status: patient remains intubated per anesthesia plan Cardiovascular status: stable Postop Assessment: no apparent nausea or vomiting Anesthetic complications: no   No notable events documented.  Last Vitals:  Vitals:   04/13/24 0145 04/13/24 0230  BP: (!) 142/104 (!) 144/109  Pulse: 72 (!) 47  Resp: (!) 21 19  Temp:    SpO2: 100% 100%    Last Pain:                 Erin Havers

## 2024-04-14 ENCOUNTER — Encounter (HOSPITAL_COMMUNITY)
Admission: EM | Disposition: A | Discharge status: Home / Self Care | Payer: Self-pay | Source: Home / Self Care | Attending: Internal Medicine

## 2024-04-14 ENCOUNTER — Inpatient Hospital Stay (HOSPITAL_COMMUNITY): Admitting: Certified Registered Nurse Anesthetist

## 2024-04-14 ENCOUNTER — Encounter (HOSPITAL_COMMUNITY): Payer: Self-pay | Admitting: General Surgery

## 2024-04-14 DIAGNOSIS — R6521 Severe sepsis with septic shock: Secondary | ICD-10-CM

## 2024-04-14 DIAGNOSIS — E039 Hypothyroidism, unspecified: Secondary | ICD-10-CM

## 2024-04-14 DIAGNOSIS — K559 Vascular disorder of intestine, unspecified: Secondary | ICD-10-CM | POA: Diagnosis not present

## 2024-04-14 DIAGNOSIS — A419 Sepsis, unspecified organism: Secondary | ICD-10-CM | POA: Diagnosis not present

## 2024-04-14 DIAGNOSIS — I4891 Unspecified atrial fibrillation: Secondary | ICD-10-CM | POA: Diagnosis not present

## 2024-04-14 DIAGNOSIS — I110 Hypertensive heart disease with heart failure: Secondary | ICD-10-CM | POA: Diagnosis not present

## 2024-04-14 DIAGNOSIS — I509 Heart failure, unspecified: Secondary | ICD-10-CM

## 2024-04-14 DIAGNOSIS — R739 Hyperglycemia, unspecified: Secondary | ICD-10-CM

## 2024-04-14 HISTORY — PX: LAPAROTOMY: SHX154

## 2024-04-14 LAB — GLUCOSE, CAPILLARY
Glucose-Capillary: 108 mg/dL — ABNORMAL HIGH (ref 70–99)
Glucose-Capillary: 108 mg/dL — ABNORMAL HIGH (ref 70–99)
Glucose-Capillary: 114 mg/dL — ABNORMAL HIGH (ref 70–99)
Glucose-Capillary: 122 mg/dL — ABNORMAL HIGH (ref 70–99)
Glucose-Capillary: 123 mg/dL — ABNORMAL HIGH (ref 70–99)
Glucose-Capillary: 124 mg/dL — ABNORMAL HIGH (ref 70–99)

## 2024-04-14 LAB — BASIC METABOLIC PANEL WITH GFR
Anion gap: 7 (ref 5–15)
BUN: 22 mg/dL (ref 8–23)
CO2: 20 mmol/L — ABNORMAL LOW (ref 22–32)
Calcium: 7.7 mg/dL — ABNORMAL LOW (ref 8.9–10.3)
Chloride: 110 mmol/L (ref 98–111)
Creatinine, Ser: 1.01 mg/dL (ref 0.61–1.24)
GFR, Estimated: >60 mL/min (ref >60)
Glucose, Bld: 117 mg/dL — ABNORMAL HIGH (ref 70–99)
Potassium: 4.5 mmol/L (ref 3.5–5.1)
Sodium: 137 mmol/L (ref 135–145)

## 2024-04-14 LAB — CBC WITH DIFFERENTIAL/PLATELET
Abs Immature Granulocytes: 0.12 10*3/uL — ABNORMAL HIGH (ref 0.00–0.07)
Basophils Absolute: 0 10*3/uL (ref 0.0–0.1)
Basophils Relative: 0 %
Eosinophils Absolute: 0 10*3/uL (ref 0.0–0.5)
Eosinophils Relative: 0 %
HCT: 33.2 % — ABNORMAL LOW (ref 39.0–52.0)
Hemoglobin: 9.5 g/dL — ABNORMAL LOW (ref 13.0–17.0)
Immature Granulocytes: 1 %
Lymphocytes Relative: 4 %
Lymphs Abs: 0.4 10*3/uL — ABNORMAL LOW (ref 0.7–4.0)
MCH: 22.6 pg — ABNORMAL LOW (ref 26.0–34.0)
MCHC: 28.6 g/dL — ABNORMAL LOW (ref 30.0–36.0)
MCV: 78.9 fL — ABNORMAL LOW (ref 80.0–100.0)
Monocytes Absolute: 0.6 10*3/uL (ref 0.1–1.0)
Monocytes Relative: 7 %
Neutro Abs: 8.1 10*3/uL — ABNORMAL HIGH (ref 1.7–7.7)
Neutrophils Relative %: 88 %
Platelets: 175 10*3/uL (ref 150–400)
RBC: 4.21 MIL/uL — ABNORMAL LOW (ref 4.22–5.81)
RDW: 15.8 % — ABNORMAL HIGH (ref 11.5–15.5)
WBC: 9.2 10*3/uL (ref 4.0–10.5)
nRBC: 4.3 % — ABNORMAL HIGH (ref 0.0–0.2)

## 2024-04-14 LAB — COOXEMETRY PANEL
Carboxyhemoglobin: 2 % — ABNORMAL HIGH (ref 0.5–1.5)
Methemoglobin: <0.7 % (ref 0.0–1.5)
O2 Saturation: 76.1 %
Total hemoglobin: 9.5 g/dL — ABNORMAL LOW (ref 12.0–16.0)

## 2024-04-14 LAB — BRAIN NATRIURETIC PEPTIDE: B Natriuretic Peptide: 179 pg/mL — ABNORMAL HIGH (ref 0.0–100.0)

## 2024-04-14 LAB — CBC
HCT: 30.7 % — ABNORMAL LOW (ref 39.0–52.0)
Hemoglobin: 9.1 g/dL — ABNORMAL LOW (ref 13.0–17.0)
MCH: 22.8 pg — ABNORMAL LOW (ref 26.0–34.0)
MCHC: 29.6 g/dL — ABNORMAL LOW (ref 30.0–36.0)
MCV: 76.9 fL — ABNORMAL LOW (ref 80.0–100.0)
Platelets: 193 10*3/uL (ref 150–400)
RBC: 3.99 MIL/uL — ABNORMAL LOW (ref 4.22–5.81)
RDW: 15.7 % — ABNORMAL HIGH (ref 11.5–15.5)
WBC: 11.4 10*3/uL — ABNORMAL HIGH (ref 4.0–10.5)
nRBC: 1.8 % — ABNORMAL HIGH (ref 0.0–0.2)

## 2024-04-14 LAB — PREPARE RBC (CROSSMATCH)

## 2024-04-14 LAB — HEMOGLOBIN A1C
Hgb A1c MFr Bld: 6.3 % — ABNORMAL HIGH (ref 4.8–5.6)
Mean Plasma Glucose: 134 mg/dL

## 2024-04-14 LAB — TRIGLYCERIDES: Triglycerides: 182 mg/dL — ABNORMAL HIGH (ref <150)

## 2024-04-14 SURGERY — LAPAROTOMY, EXPLORATORY
Anesthesia: General

## 2024-04-14 MED ORDER — FENTANYL CITRATE (PF) 100 MCG/2ML IJ SOLN
INTRAMUSCULAR | Status: AC
  Filled 2024-04-14: qty 2

## 2024-04-14 MED ORDER — HYDROMORPHONE HCL 2 MG/ML IJ SOLN
INTRAMUSCULAR | Status: AC
  Filled 2024-04-14: qty 1

## 2024-04-14 MED ORDER — DEXAMETHASONE SODIUM PHOSPHATE 10 MG/ML IJ SOLN
INTRAMUSCULAR | Status: AC
  Filled 2024-04-14: qty 1

## 2024-04-14 MED ORDER — KETAMINE HCL 50 MG/5ML IJ SOSY
PREFILLED_SYRINGE | INTRAMUSCULAR | Status: AC
Start: 2024-04-14 — End: ?
  Filled 2024-04-14: qty 5

## 2024-04-14 MED ORDER — ROCURONIUM BROMIDE 10 MG/ML (PF) SYRINGE
PREFILLED_SYRINGE | INTRAVENOUS | Status: DC | PRN
  Administered 2024-04-14: 80 mg via INTRAVENOUS

## 2024-04-14 MED ORDER — LACTATED RINGERS IV SOLN: INTRAVENOUS | Status: AC

## 2024-04-14 MED ORDER — ROCURONIUM BROMIDE 10 MG/ML (PF) SYRINGE
PREFILLED_SYRINGE | INTRAVENOUS | Status: AC
  Filled 2024-04-14: qty 10

## 2024-04-14 MED ORDER — ACETAMINOPHEN 10 MG/ML IV SOLN
INTRAVENOUS | Status: DC | PRN
  Administered 2024-04-14: 1000 mg via INTRAVENOUS

## 2024-04-14 MED ORDER — ONDANSETRON HCL 4 MG/2ML IJ SOLN
INTRAMUSCULAR | Status: DC | PRN
  Administered 2024-04-14: 4 mg via INTRAVENOUS

## 2024-04-14 MED ORDER — METOPROLOL TARTRATE 5 MG/5ML IV SOLN
INTRAVENOUS | Status: AC
Start: 2024-04-14 — End: ?
  Filled 2024-04-14: qty 5

## 2024-04-14 MED ORDER — METOPROLOL TARTRATE 5 MG/5ML IV SOLN
INTRAVENOUS | Status: DC | PRN
Start: 2024-04-14 — End: 2024-04-14
  Administered 2024-04-14 (×2): 2 mg via INTRAVENOUS

## 2024-04-14 MED ORDER — HEPARIN (PORCINE) 25000 UT/250ML-% IV SOLN
1750.0000 [IU]/h | INTRAVENOUS | Status: DC
  Administered 2024-04-14: 1350 [IU]/h via INTRAVENOUS
  Administered 2024-04-15 – 2024-04-16 (×3): 1450 [IU]/h via INTRAVENOUS
  Administered 2024-04-18 – 2024-04-19 (×4): 1850 [IU]/h via INTRAVENOUS
  Administered 2024-04-20 – 2024-04-21 (×2): 1750 [IU]/h via INTRAVENOUS
  Filled 2024-04-14 (×11): qty 250

## 2024-04-14 MED ORDER — MIDAZOLAM HCL 5 MG/5ML IJ SOLN
INTRAMUSCULAR | Status: DC | PRN
  Administered 2024-04-14 (×2): 1 mg via INTRAVENOUS

## 2024-04-14 MED ORDER — ACETAMINOPHEN 10 MG/ML IV SOLN
INTRAVENOUS | Status: AC
  Filled 2024-04-14: qty 100

## 2024-04-14 MED ORDER — HYDROMORPHONE HCL 1 MG/ML IJ SOLN
INTRAMUSCULAR | Status: DC | PRN
  Administered 2024-04-14 (×2): 1 mg via INTRAVENOUS

## 2024-04-14 MED ORDER — KETAMINE HCL 10 MG/ML IJ SOLN
INTRAMUSCULAR | Status: DC | PRN
Start: 2024-04-14 — End: 2024-04-14
  Administered 2024-04-14: 20 mg via INTRAVENOUS
  Administered 2024-04-14: 30 mg via INTRAVENOUS

## 2024-04-14 MED ORDER — DEXAMETHASONE SODIUM PHOSPHATE 4 MG/ML IJ SOLN
INTRAMUSCULAR | Status: DC | PRN
  Administered 2024-04-14: 5 mg via INTRAVENOUS

## 2024-04-14 MED ORDER — FENTANYL CITRATE (PF) 100 MCG/2ML IJ SOLN
INTRAMUSCULAR | Status: DC | PRN
Start: 2024-04-14 — End: 2024-04-14
  Administered 2024-04-14 (×2): 50 ug via INTRAVENOUS

## 2024-04-14 MED ORDER — NOREPINEPHRINE 4 MG/250ML-% IV SOLN
0.0000 ug/min | INTRAVENOUS | Status: DC
Start: 2024-04-14 — End: 2024-04-15
  Administered 2024-04-14: 2 ug/min via INTRAVENOUS
  Filled 2024-04-14: qty 250

## 2024-04-14 MED ORDER — 0.9 % SODIUM CHLORIDE (POUR BTL) OPTIME
TOPICAL | Status: DC | PRN
  Administered 2024-04-14: 1000 mL

## 2024-04-14 MED ORDER — ONDANSETRON HCL 4 MG/2ML IJ SOLN
INTRAMUSCULAR | Status: AC
  Filled 2024-04-14: qty 2

## 2024-04-14 MED ORDER — PNEUMOCOCCAL 20-VAL CONJ VACC 0.5 ML IM SUSY
0.5000 mL | PREFILLED_SYRINGE | INTRAMUSCULAR | Status: DC
  Filled 2024-04-14: qty 0.5

## 2024-04-14 MED ORDER — MIDAZOLAM HCL 2 MG/2ML IJ SOLN
INTRAMUSCULAR | Status: AC
  Filled 2024-04-14: qty 2

## 2024-04-14 SURGICAL SUPPLY — 38 items
APPLICATOR COTTON TIP 6 STRL (MISCELLANEOUS) ×2 IMPLANT
APPLICATOR COTTON TIP 6IN STRL (MISCELLANEOUS) ×1 IMPLANT
BAG COUNTER SPONGE SURGICOUNT (BAG) IMPLANT
BLADE EXTENDED COATED 6.5IN (ELECTRODE) IMPLANT
BLADE HEX COATED 2.75 (ELECTRODE) ×2 IMPLANT
CANISTER WOUNDNEG PRESSURE 500 (CANNISTER) IMPLANT
COVER MAYO STAND STRL (DRAPES) IMPLANT
DRAPE LAPAROSCOPIC ABDOMINAL (DRAPES) ×2 IMPLANT
DRAPE WARM FLUID 44X44 (DRAPES) IMPLANT
DRSG VAC GRANUFOAM MED (GAUZE/BANDAGES/DRESSINGS) IMPLANT
ELECT REM PT RETURN 15FT ADLT (MISCELLANEOUS) ×2 IMPLANT
GAUZE SPONGE 4X4 12PLY STRL (GAUZE/BANDAGES/DRESSINGS) ×2 IMPLANT
GLOVE BIOGEL PI IND STRL 7.0 (GLOVE) ×2 IMPLANT
GLOVE ECLIPSE 8.0 STRL XLNG CF (GLOVE) ×2 IMPLANT
GLOVE INDICATOR 8.0 STRL GRN (GLOVE) ×4 IMPLANT
GOWN STRL REUS W/ TWL XL LVL3 (GOWN DISPOSABLE) ×4 IMPLANT
HANDLE SUCTION POOLE (INSTRUMENTS) IMPLANT
KIT BASIN OR (CUSTOM PROCEDURE TRAY) ×2 IMPLANT
KIT TURNOVER KIT A (KITS) IMPLANT
LIGASURE IMPACT 36 18CM CVD LR (INSTRUMENTS) IMPLANT
NS IRRIG 1000ML POUR BTL (IV SOLUTION) ×2 IMPLANT
PACK GENERAL/GYN (CUSTOM PROCEDURE TRAY) ×2 IMPLANT
RELOAD PROXIMATE 75MM BLUE (ENDOMECHANICALS) ×2 IMPLANT
RELOAD STAPLE 75 3.8 BLU REG (ENDOMECHANICALS) IMPLANT
STAPLER GUN LINEAR PROX 60 (STAPLE) IMPLANT
STAPLER PROXIMATE 75MM BLUE (STAPLE) IMPLANT
STAPLER SKIN PROX 35W (STAPLE) ×2 IMPLANT
SUT PDS AB 1 CT1 27 (SUTURE) IMPLANT
SUT PDS AB 1 TP1 96 (SUTURE) IMPLANT
SUT SILK 2 0 SH CR/8 (SUTURE) IMPLANT
SUT SILK 2-0 18XBRD TIE 12 (SUTURE) IMPLANT
SUT SILK 3 0 SH CR/8 (SUTURE) IMPLANT
SUT SILK 3-0 18XBRD TIE 12 (SUTURE) IMPLANT
SUT VIC AB 2-0 SH 18 (SUTURE) IMPLANT
SUT VIC AB 3-0 SH 18 (SUTURE) IMPLANT
TOWEL OR 17X26 10 PK STRL BLUE (TOWEL DISPOSABLE) ×4 IMPLANT
TRAY FOLEY MTR SLVR 16FR STAT (SET/KITS/TRAYS/PACK) IMPLANT
YANKAUER SUCT BULB TIP NO VENT (SUCTIONS) IMPLANT

## 2024-04-14 NOTE — TOC Initial Note (Signed)
 Transition of Care [facility]) - Initial/Assessment Note    Patient Details  Name: Donald Morales MRN: [id number] Date of Birth: May 21, 1956  [facility] CM/SW Contact:    Ruben Corolla, RN Phone Number: 04/14/2024, 12:00 PM  Clinical Narrative:  d/c plan home. Exploratory Lap;possible SBR.[name]                  Expected Discharge Plan: Home/Self Care Barriers to Discharge: Continued Medical Work up   Patient Goals and CMS Choice Patient states their goals for this hospitalization and ongoing recovery are:: Home CMS Medicare.gov Compare Post Acute Care list provided to:: Patient Choice offered to / list presented to : Patient [place] ownership interest in [facility].provided to:: Patient    Expected Discharge Plan and Services                                              Prior Living Arrangements/Services                       Activities of Daily Living   ADL Screening (condition at time of admission) Independently performs ADLs?: No Does the patient have a NEW difficulty with bathing/dressing/toileting/self-feeding that is expected to last >3 days?: Yes (Initiates electronic notice to provider for possible OT consult) Does the patient have a NEW difficulty with getting in/out of bed, walking, or climbing stairs that is expected to last >3 days?: Yes (Initiates electronic notice to provider for possible PT consult) Does the patient have a NEW difficulty with communication that is expected to last >3 days?: Yes (Initiates electronic notice to provider for possible SLP consult) Is the patient deaf or have difficulty hearing?: No Does the patient have difficulty seeing, even when wearing glasses/contacts?: No Does the patient have difficulty concentrating, remembering, or making decisions?: Yes  Permission Sought/Granted                  Emotional Assessment              Admission diagnosis:  Lower abdominal pain  [R10.30] Ischemic bowel disease (HCC) [K55.9] Patient Active Problem List   Diagnosis Date Noted   Ischemic bowel disease (HCC) 04/13/2024   Persistent atrial fibrillation (HCC) 10/30/2018   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) 03/11/2015   PVC's (premature ventricular contractions) 03/11/2015   PAC (premature atrial contraction) 03/11/2015   Myalgia 03/09/2015   Adult hypothyroidism 03/09/2015   Hypothyroidism 03/09/2015   Combined fat and carbohydrate induced hyperlipemia 03/09/2015   Encounter for screening for malignant neoplasm of prostate 01/08/2015   Encounter for screening for malignant neoplasm of colon 01/08/2015   Screening examination for poliomyelitis 01/08/2015   Cardiac arrhythmia 01/08/2015   Epistaxis 01/08/2015   Enlarged prostate without lower urinary tract symptoms (luts) 01/08/2015   Allergic rhinitis 04/14/2008   Dyslipidemia 04/13/2008   DEPRESSION 04/13/2008   Essential hypertension 04/13/2008   G E R D 04/13/2008   Dyspnea on exertion 04/13/2008   ELECTROCARDIOGRAM, ABNORMAL 04/13/2008   PCP:  Rae Bugler, MD Pharmacy:   [id number] [place], [place] - [address] [place] [address] Phone: [phone] Fax: [phone]     Social Drivers of Health (SDOH) Social History: SDOH Screenings   Food Insecurity: Patient Unable To Answer (04/13/2024)  Housing: Patient Unable To  Answer (04/13/2024)  Transportation Needs: Patient Unable To Answer (04/13/2024)  Utilities: Patient Unable To Answer (04/13/2024)  Social Connections: Patient Unable To Answer (04/13/2024)  Tobacco Use: Low Risk  (04/12/2024)   SDOH Interventions:     Readmission Risk Interventions     No data to display

## 2024-04-14 NOTE — Progress Notes (Signed)
 NAME:  Donald Morales, MRN:  [id number], DOB:  1956/08/18, LOS: 1 ADMISSION DATE:  04/12/2024, CONSULTATION DATE:  04/13/2024 REFERRING MD:  Iva Mariner, MD CHIEF COMPLAINT:  Abdominal pain   History of Present Illness:  68 year old male with a history of hypertension, thyroid disease, A-fib not taking Eliquis  or other anti-coagulation for at least 2 years, presents with abdominal pain. He states that a few weeks ago he had a URI that progressed to his abdomen. At first it seemed like it was due to his cough but over the past few days he has been having progressively worsening abdominal pain. It feels like a cramping sensation. He had been constipated without a solid bowel movement in about 5 days that he is had real small bowel movements. He has not had any fevers or vomiting. He has been having decreased urine output without dysuria.  He describes cramping abdominal pain x 1 week.    CT scan shows bowel wall thickening. There was an issue with obtaining venous phase timing so it is not definitively clear if there is SMV thrombus but per radiologist discussion with EDP, there is concern for this. Patient was taken to OR.  Pertinent  Medical History  Atrial Fibrillation  Significant Hospital Events: Including procedures, antibiotic start and stop dates in addition to other pertinent events   5/11 taken to OR for bowel ischemia, s/p laparoscopic abdominal surgery converted to open for necrotic small bowel - 40cm small bowel resected and notable mesenteric vein thromboses. Left in discontinuity to return to OR in 24-48hrs. Admitted to ICU post op 5/12 no issues overnight.  Remains on low-dose phenylephrine  Interim History / Subjective:  Sedated on propofol and fentanyl  Objective    Blood pressure (!) 116/97, pulse 97, temperature 97.9 F (36.6 C), temperature source Axillary, resp. rate (!) 24, height 5\' 9"  (1.753 m), weight 94.4 kg, SpO2 100%.    Vent Mode: PRVC FiO2 (%):  [30 %] 30  % Set Rate:  [20 bmp] 20 bmp Vt Set:  [420 mL] 420 mL PEEP:  [5 cmH20] 5 cmH20 Plateau Pressure:  [10 cmH20-17 cmH20] 15 cmH20   Intake/Output Summary (Last 24 hours) at 04/14/2024 0829 Last data filed at 04/14/2024 0544 Gross per 24 hour  Intake 3528.95 ml  Output 3225 ml  Net 303.95 ml   Filed Weights   04/12/24 2041 04/13/24 0536 04/14/24 0500  Weight: 87.5 kg 95.1 kg 94.4 kg    Examination:  General well-nourished 68 year old male patient currently sedated on mechanical ventilator HEENT normocephalic atraumatic orally intubated has a right IJ double-lumen catheter dressings clean dry and intact sclera are nonicteric mucous membranes are moist Pulmonary clear to auscultation current peak inspiratory pressures at 16 pulse oximetry in the high 90s, portable chest x-ray personally reviewed showed endotracheal tube in satisfactory position there is no infiltrates on chest x-ray Cardiac irregular irregular with atrial fibrillation on telemetry no murmur rub or gallop appreciated echocardiogram reviewed below Abdomen soft, currently no bowel sounds.  He has a wound VAC in place Extremities warm, trace lower extremity edema brisk capillary refill, strong pulses Neuro heavily sedated GU Foley catheter in place with clear yellow urine    Resolved prob list     Mild Hyponatremia Assessment & Plan:   Septic Shock w lactic acidosis 2/2 ischemic gut; c/b af w/ RVR and HFrEF (30-35% w/ LV regional wall abnormalities as well as dilated LV and decreased RV fxn) - lactic acid was trending down, MRSA PCR  neg - EF reduced from 2023, was 35-40% but the LV wall abnormality and RV fxn were both reduced at that point as well - wbc ct stable  Plan Day 3/x vanc and zosyn Keep euvolemic Cont neo for MAP > 65 for now but check co-ox. 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 Nutritional support as directed by surgical team Plan for back to OR today   Atrial Fibrillation with RVR c/b Chronic Systolic Heart Failure -CCB stopped earlier  -rate: Currently controlled Plan Rate control; given reduced EF if RVR becomes an issue again would rx w/ amio  Heparin on hold for OR; resume when ok w/ surgical team  K >4 Mg >2  Post-op Ventilator Management Plan Continuing full ventilator support VAP bundle PAD protocol RASS goal -4 while in discontinuity A.m. chest x-ray  Non-anion gap Metabolic Acidosis; suspect 2/2 hyperchloremia  Plan Repeat chem this afternoon  Change IV fluids to balanced crystalloids  Anemia Plan Trend cbc transfuse for hemoglobin 7g/dL or less  Hyperglycemia Plan SSI goal 140-180  Hx of hypothyroidism - tsh 4.6 Plan  Synthroid replacement   Best Practice (right click and "Reselect all SmartList Selections" daily)   Diet/type: NPO DVT prophylaxis systemic heparin Pressure ulcer(s): pressure ulcer assessment deferred to nursing assessment GI prophylaxis: PPI Lines: Central line Foley:  Yes, and it is still needed Code Status:  full code Last date of multidisciplinary goals of care discussion [pending]   Critical care time: 34 min

## 2024-04-14 NOTE — Progress Notes (Signed)
 SLP Cancellation Note  Patient Details Name: Donald Morales MRN: [id number] DOB: May 26, 1956   Cancelled treatment:       Reason Eval/Treat Not Completed: Patient not medically ready;Other (comment) (intubated and sedated. SLP will follow for readiness.)   Jacqualine Mater, MA, CCC-SLP Speech Therapy

## 2024-04-14 NOTE — Transfer of Care (Signed)
 Immediate Anesthesia Transfer of Care Note  Patient: Donald Morales  Procedure(s) Performed: LAPAROTOMY, EXPLORATORY  Patient Location: ICU  Anesthesia Type:General  Level of Consciousness: Patient remains intubated per anesthesia plan  Airway & Oxygen Therapy: Patient remains intubated per anesthesia plan and Patient placed on Ventilator (see vital sign flow sheet for setting)  Post-op Assessment: Report given to RN and Post -op Vital signs reviewed and stable  Post vital signs: Reviewed and stable  Last Vitals:  Vitals Value Taken Time  BP    Temp    Pulse    Resp    SpO2      Last Pain:  Vitals:   04/14/24 0800  TempSrc: Oral  PainSc:          Complications: No notable events documented.

## 2024-04-14 NOTE — Op Note (Signed)
 Preoperative diagnosis: 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  Postoperative diagnosis: Same  Procedure: Exploratory laparotomy with resection of approximately 40 cm of ileum with primary anastomosis and closure of abdomen and placement of subcutaneous wound VAC  Surgeon: Sim Dryer, MD  Assistant: Berkeley Breath, PA-C  EBL: 50 cc  Drains: None  Specimen: Small bowel to pathology  Indications for procedure: The patient is a 68 year old male admitted 2 days ago with severe abdominal pain.  He was found to have SMV thrombosis and small bowel necrosis.  He underwent initial small bowel resection by Dr. Ramiro Burly and was left open discontinuity for second look operation.  He returns today for second look operation and potential further bowel resection, anastomosis and abdominal wall closure.  He is stabilized over the last 24 hours and is off the majority of his inotropic pressor support.  He is now anticoagulated.The procedure has been discussed with the patient.  Alternative therapies have been discussed with the patient.  Operative risks include bleeding,  Infection,  Organ injury,  Nerve injury,  Blood vessel injury,  DVT,  Pulmonary embolism,  Death,  And possible reoperation.  Medical management risks include worsening of present situation.  The success of the procedure is 50 -90 % at treating patients symptoms.  The patient understands and agrees to proceed.   Description of procedure: The patient was brought directly from the ICU intubated to the operative room.  After appropriate levels of anesthesia were initiated his wound VAC was removed.  His abdomen was then prepped and draped in a sterile fashion and timeout performed.  Once remainder of the internal VAC was removed small bowel examined.  The small bowel was run from the ligament of Treitz to the staple line.  The distal staple was identified and run to the ileocecal valve.  There was some  minimally dusky small bowel at the proximal staple line.  I decided to cut this back about 20 cm.  There is also some dusky small bowel distally and we took an additional 20 cm from that back to nice healthy pink small bowel.  These areas were resected using a combination of GIA 45 stapler's and the LigaSure.  The mesentery is examined.  There is no further thrombosis of the mesenteric veins that I can see.  There is strong arterial inflow as well.  Given the nature of the appearance of the bowel and overall good blood flow, I opted to create anastomosis.  This was done in a side-to-side fashion using a GIA 45 stapling device and a TA 65, enterotomy.  2-0 silk was used for the crotch anastomosis.  Mesenteric defect closed with interrupted 3-0 silk suture.  There is no undue tension and the anastomosis was widely patent with no significant kinking or angulation.  Hemostasis was good.  Small bowel was returned into the abdominal cavity.  I ran the small bowel second time and so no other areas of ischemia nor twisting of the bowel.  The ascending colon, transverse colon, descending colon down to the rectum appeared viable.  Stomach was viable.  No other areas of ischemia were noted.  Irrigation was used.  Abdominal wall closed with vertical suture of 2-0 PDS.  Then a double-stranded 1 PDS was run transversely across this to close the fascia.  Black sponge placed and wound VAC reapplied.  All counts were found to be correct.  The patient was then returned to the ICU intubated in stable condition.

## 2024-04-14 NOTE — Plan of Care (Signed)
  Problem: Clinical Measurements: Goal: Ability to maintain clinical measurements within normal limits will improve Outcome: Progressing Goal: Will remain free from infection Outcome: Progressing Goal: Diagnostic test results will improve Outcome: Progressing Goal: Respiratory complications will improve Outcome: Progressing Goal: Cardiovascular complication will be avoided Outcome: Progressing   Problem: Activity: Goal: Risk for activity intolerance will decrease Outcome: Progressing   Problem: Elimination: Goal: Will not experience complications related to bowel motility Outcome: Progressing Goal: Will not experience complications related to urinary retention Outcome: Progressing   Problem: Nutrition: Goal: Adequate nutrition will be maintained Outcome: Not Progressing

## 2024-04-14 NOTE — Progress Notes (Signed)
 1 Day Post-Op   Subjective/Chief Complaint: Patient intubated sedated.  He is off pressors and off heparin.   Objective: Vital signs in last 24 hours: Temp:  [96.9 F (36.1 C)-98.3 F (36.8 C)] 97.9 F (36.6 C) (05/12 0440) Pulse Rate:  [69-130] 97 (05/12 0645) Resp:  [14-26] 24 (05/12 0645) BP: (92-117)/(63-97) 116/97 (05/12 0605) SpO2:  [97 %-100 %] 100 % (05/12 0645) Arterial Line BP: (89-119)/(68-88) 99/85 (05/12 0645) FiO2 (%):  [30 %] 30 % (05/12 0803) Weight:  [94.4 kg] 94.4 kg (05/12 0500) Last BM Date :  (PTA)  Intake/Output from previous day: 05/11 0701 - 05/12 0700 In: 3598.1 [I.V.:3448.4; IV Piggyback:149.7] Out: 3225 [Urine:2625; Drains:600] Intake/Output this shift: No intake/output data recorded.  Abdomen: VAC in place.  Serous drainage  from vac.  Lab Results:  Recent Labs    04/13/24 0612 04/14/24 0352  WBC 11.4* 11.4*  HGB 9.3* 9.1*  HCT 30.8* 30.7*  PLT 186 193   BMET Recent Labs    04/13/24 1608 04/14/24 0352  NA 135 137  K 4.5 4.5  CL 108 110  CO2 24 20*  GLUCOSE 121* 117*  BUN 21 22  CREATININE 1.12 1.01  CALCIUM 7.7* 7.7*   PT/INR No results for input(s): "LABPROT", "INR" in the last 72 hours. ABG Recent Labs    04/13/24 0558  PHART 7.44  HCO3 19.5*    Studies/Results: ECHOCARDIOGRAM LIMITED Result Date: 04/13/2024    ECHOCARDIOGRAM LIMITED REPORT   Patient Name:   Donald Morales Date of Exam: 04/13/2024 Medical Rec #:  [id number]       Height:       69.0 in Accession #:    [id number]      Weight:       209.7 lb Date of Birth:  1956-02-08       BSA:          2.108 m Patient Age:    68 years        BP:           118/86 mmHg Patient Gender: M               HR:           92 bpm. Exam Location:  Inpatient Procedure: Limited Echo, Cardiac Doppler, Limited Color Doppler and Intracardiac            Opacification Agent (Both Spectral and Color Flow Doppler were            utilized during procedure). Indications:    CHF  History:         Patient has prior history of Echocardiogram examinations, most                 recent 02/07/2022. Arrythmias:Atrial Fibrillation and RBBB,                 Signs/Symptoms:Shortness of Breath and Edema; Risk                 Factors:Hypertension and Dyslipidemia. PVC, PAC, lower extremity                 edema.  Sonographer:    Juanita Shaw Referring Phys: [id number] JONATHAN B DEWALD IMPRESSIONS  1. Left ventricular ejection fraction, by estimation, is 30 to 35%. The left ventricle has moderately decreased function. The left ventricle demonstrates regional wall motion abnormalities (see scoring diagram/findings for description). The left ventricular internal cavity size was mildly dilated.  2. Right ventricular systolic  function is moderately reduced. The right ventricular size is moderately enlarged.  3. Moderate to severe mitral valve regurgitation.  4. Tricuspid valve regurgitation is moderate.  5. The aortic valve is tricuspid. Aortic valve regurgitation is not visualized. No aortic stenosis is present. FINDINGS  Left Ventricle: Left ventricular ejection fraction, by estimation, is 30 to 35%. The left ventricle has moderately decreased function. The left ventricle demonstrates regional wall motion abnormalities. The left ventricular internal cavity size was mildly dilated.  LV Wall Scoring: The inferior wall, mid inferoseptal segment, and basal inferoseptal segment are akinetic. The entire anterior wall, antero-lateral wall, entire anterior septum, entire apex, and basal inferolateral segment are hypokinetic. Right Ventricle: The right ventricular size is moderately enlarged. Right ventricular systolic function is moderately reduced. Mitral Valve: Moderate to severe mitral valve regurgitation. Tricuspid Valve: Tricuspid valve regurgitation is moderate. Aortic Valve: The aortic valve is tricuspid. Aortic valve regurgitation is not visualized. No aortic stenosis is present. LEFT VENTRICLE PLAX 2D LVIDd:         5.50 cm  LVIDs:         4.50 cm LV PW:         1.00 cm LV IVS:        0.80 cm  LV Volumes (MOD) LV vol d, MOD A2C: 247.0 ml LV vol d, MOD A4C: 178.5 ml LV vol s, MOD A2C: 149.0 ml LV vol s, MOD A4C: 107.9 ml LV SV MOD A2C:     98.0 ml LV SV MOD A4C:     178.5 ml LV SV MOD BP:      86.0 ml RIGHT VENTRICLE RV S prime:     6.74 cm/s TAPSE (M-mode): 1.1 cm MR Peak grad:   80.3 mmHg    TRICUSPID VALVE MR Mean grad:   47.7 mmHg    TR Peak grad:   24.8 mmHg MR Vmax:        448.00 cm/s  TR Vmax:        249.00 cm/s MR Vmean:       313.7 cm/s MR PISA:        0.57 cm MR PISA Radius: 0.30 cm Kardie Tobb DO Electronically signed by Jerryl Morin DO Signature Date/Time: 04/13/2024/1:47:35 PM    Final    DG Abd 1 View Result Date: 04/13/2024 CLINICAL DATA:  Evaluate NG tube placement EXAM: ABDOMEN - 1 VIEW COMPARISON:  CT 04/12/2024 . FINDINGS: The enteric tube tip and side port are below the hemidiaphragms within the gastric fundus. Multiple left kidney stones are again noted. IMPRESSION: Enteric tube tip and side port are below the hemidiaphragms within the gastric fundus. Electronically Signed   By: Kimberley Penman M.D.   On: 04/13/2024 10:33   DG CHEST PORT 1 VIEW Result Date: 04/13/2024 CLINICAL DATA:  Intubation EXAM: PORTABLE CHEST 1 VIEW COMPARISON:  08/16/2022 FINDINGS: Cardiomegaly.  Negative mediastinal contours given rotation. Enteric tube with tip reaching the stomach. Endotracheal tube with tip between the clavicular heads and carina. Right IJ line with tip at the SVC. There is no edema, consolidation, effusion, or pneumothorax. Excluded lateral left base. IMPRESSION: Unremarkable hardware. Cardiomegaly without visible acute cardiopulmonary disease. Electronically Signed   By: Ronnette Coke M.D.   On: 04/13/2024 07:12    Anti-infectives: Anti-infectives (From admission, onward)    Start     Dose/Rate Route Frequency Ordered Stop   04/14/24 1000  vancomycin (VANCOREADY) IVPB 1500 mg/300 mL        1,500 mg 150  mL/hr  over 120 Minutes Intravenous Every 24 hours 04/13/24 0731     04/14/24 0700  vancomycin (VANCOREADY) IVPB 1750 mg/350 mL  Status:  Discontinued        1,750 mg 175 mL/hr over 120 Minutes Intravenous Every 24 hours 04/13/24 0547 04/13/24 0731   04/13/24 0615  vancomycin (VANCOREADY) IVPB 1750 mg/350 mL        1,750 mg 175 mL/hr over 120 Minutes Intravenous  Once 04/13/24 0525 04/13/24 0811   04/13/24 0400  piperacillin-tazobactam (ZOSYN) IVPB 3.375 g  Status:  Discontinued        3.375 g 100 mL/hr over 30 Minutes Intravenous Every 6 hours 04/13/24 0240 04/13/24 0244   04/13/24 0400  piperacillin-tazobactam (ZOSYN) IVPB 3.375 g        3.375 g 12.5 mL/hr over 240 Minutes Intravenous Every 8 hours 04/13/24 0245     04/12/24 2245  piperacillin-tazobactam (ZOSYN) IVPB 3.375 g        3.375 g 100 mL/hr over 30 Minutes Intravenous  Once 04/12/24 2237 04/12/24 2333       Assessment/Plan: s/p Procedure(s): LAPAROSCOPY, DIAGNOSTIC (N/A) LAPAROTOMY EXCISION, SMALL INTESTINE APPLICATION, WOUND VAC Return to the OR today for possible resection and dural closure.  Left message with next of kin about procedure.[name]   Operative risks include bleeding,  Infection,  Organ injury,  Nerve injury,  Blood vessel injury,  DVT,  Pulmonary embolism,  Death,  And possible reoperation.  Medical management risks include worsening of present situation.  The success of the procedure is 50 -90 % at treating patients symptoms.   LOS: 1 day    Rodrigo Clara MD  04/14/2024

## 2024-04-14 NOTE — Progress Notes (Signed)
 Initial Nutrition Assessment  DOCUMENTATION CODES:   Obesity unspecified  INTERVENTION:  - If able to initiate tube feeds, would recommend: Pivot 1.5 at 55 ml/h (1320 ml per day *Would recommend starting at 27mL/hr with 10mL Q12H advancements (or per CCM/CCS) Prosource TF20 60 ml daily Provides 2060 kcal, 144 gm protein, 990 ml free water daily  - If patient expected to need extended period of NPO/bowel rest for >3 days, would recommend PICC/TPN.   NUTRITION DIAGNOSIS:   Inadequate oral intake related to inability to eat as evidenced by NPO status.  GOAL:   Patient will meet greater than or equal to 90% of their needs  MONITOR:   Vent status, Labs, Weight trends  REASON FOR ASSESSMENT:   Ventilator    ASSESSMENT:   68 y.o. male with a PMH of HTN, thyroid disease, A-fib who presented with abdominal pain. Admitted for small bowel ischemia with septic shock.   5/11 Admit; taken to OR for bowel ischemia, s/p laparoscopic abdominal surgery converted to open for necrotic small bowel - 40cm small bowel resected and notable mesenteric vein thromboses; Left in discontinuity 5/12 Taken back to OR with CCS - ex-lap with another resection of ~40cm of ileum with primary anastomosis and closure of abdomen with wound VAC  Patient is currently intubated on ventilator support MV: 8.3 L/min Temp (24hrs), Avg:97.8 F (36.6 C), Min:96.9 F (36.1 C), Max:98.2 F (36.8 C)  Patient in OR at time of visit. No family/visitors in room.  Per chart review, no weight history since 2023 to assess weight trends over the past year.   OGT in place, xray verified in the stomach. Will provide TF recs in case able to start EN. However, suspect patient may require bowel rest given procedures so would recommend TPN if unable to start EN in the next 2-3 days.   Medications reviewed and include:  Fentanyl Neo-Synephrine @ 40 mcg/min Propofol @ 15.75mL/hr (provides 416 kcals over 24 hours)  Labs  reviewed:  -   NUTRITION - FOCUSED PHYSICAL EXAM:  Unable to obtain, patient in OR  Diet Order:   Diet Order             Diet NPO time specified  Diet effective now                   EDUCATION NEEDS:  Not appropriate for education at this time  Skin:  Skin Assessment: Skin Integrity Issues: Skin Integrity Issues:: Incisions Incisions: Abdomen  Last BM:  PTA  Height:  Ht Readings from Last 1 Encounters:  04/14/24 5\' 9"  (1.753 m)   Weight:  Wt Readings from Last 1 Encounters:  04/14/24 94.4 kg   Ideal Body Weight:  72.73 kg  BMI:  Body mass index is 30.73 kg/m.  Estimated Nutritional Needs:  Kcal:  1800-2200 kcals Protein:  125-145 grams Fluid:  >/= 1.8L    Scheryl Cushing RD, LDN Contact via Secure Chat.

## 2024-04-14 NOTE — Anesthesia Postprocedure Evaluation (Signed)
 Anesthesia Post Note  Patient: Donald Morales  Procedure(s) Performed: LAPAROTOMY, EXPLORATORY SMALL BOWEL RESECTION OF APPROXIMATELY 40 CM OF ILEUM W/ PRIMARY ANASTOMOSIS AND CLOSURE OF ABDOMEN AND PLACEMENT OF SUBCU WOUND VAC     Patient location during evaluation: ICU Anesthesia Type: General Level of consciousness: patient remains intubated per anesthesia plan Pain management: pain level controlled Vital Signs Assessment: post-procedure vital signs reviewed and stable Respiratory status: patient remains intubated per anesthesia plan Cardiovascular status: blood pressure returned to baseline and stable Postop Assessment: no apparent nausea or vomiting Anesthetic complications: no   No notable events documented.  Last Vitals:  Vitals:   04/14/24 1030 04/14/24 1100  BP:    Pulse: 88 78  Resp: 20 20  Temp:    SpO2: 100% 99%    Last Pain:  Vitals:   04/14/24 0800  TempSrc: Oral  PainSc:                  Jacquelyne Matte

## 2024-04-14 NOTE — H&P (View-Only) (Signed)
 1 Day Post-Op   Subjective/Chief Complaint: Patient intubated sedated.  He is off pressors and off heparin.   Objective: Vital signs in last 24 hours: Temp:  [96.9 F (36.1 C)-98.3 F (36.8 C)] 97.9 F (36.6 C) (05/12 0440) Pulse Rate:  [69-130] 97 (05/12 0645) Resp:  [14-26] 24 (05/12 0645) BP: (92-117)/(63-97) 116/97 (05/12 0605) SpO2:  [97 %-100 %] 100 % (05/12 0645) Arterial Line BP: (89-119)/(68-88) 99/85 (05/12 0645) FiO2 (%):  [30 %] 30 % (05/12 0803) Weight:  [94.4 kg] 94.4 kg (05/12 0500) Last BM Date :  (PTA)  Intake/Output from previous day: 05/11 0701 - 05/12 0700 In: 3598.1 [I.V.:3448.4; IV Piggyback:149.7] Out: 3225 [Urine:2625; Drains:600] Intake/Output this shift: No intake/output data recorded.  Abdomen: VAC in place.  Serous drainage  from vac.  Lab Results:  Recent Labs    04/13/24 0612 04/14/24 0352  WBC 11.4* 11.4*  HGB 9.3* 9.1*  HCT 30.8* 30.7*  PLT 186 193   BMET Recent Labs    04/13/24 1608 04/14/24 0352  NA 135 137  K 4.5 4.5  CL 108 110  CO2 24 20*  GLUCOSE 121* 117*  BUN 21 22  CREATININE 1.12 1.01  CALCIUM 7.7* 7.7*   PT/INR No results for input(s): "LABPROT", "INR" in the last 72 hours. ABG Recent Labs    04/13/24 0558  PHART 7.44  HCO3 19.5*    Studies/Results: ECHOCARDIOGRAM LIMITED Result Date: 04/13/2024    ECHOCARDIOGRAM LIMITED REPORT   Patient Name:   Donald Morales Date of Exam: 04/13/2024 Medical Rec #:  [id number]       Height:       69.0 in Accession #:    [id number]      Weight:       209.7 lb Date of Birth:  1956/03/23       BSA:          2.108 m Patient Age:    67 years        BP:           118/86 mmHg Patient Gender: M               HR:           92 bpm. Exam Location:  Inpatient Procedure: Limited Echo, Cardiac Doppler, Limited Color Doppler and Intracardiac            Opacification Agent (Both Spectral and Color Flow Doppler were            utilized during procedure). Indications:    CHF  History:         Patient has prior history of Echocardiogram examinations, most                 recent 02/07/2022. Arrythmias:Atrial Fibrillation and RBBB,                 Signs/Symptoms:Shortness of Breath and Edema; Risk                 Factors:Hypertension and Dyslipidemia. PVC, PAC, lower extremity                 edema.  Sonographer:    Juanita Shaw Referring Phys: [id number] JONATHAN B DEWALD IMPRESSIONS  1. Left ventricular ejection fraction, by estimation, is 30 to 35%. The left ventricle has moderately decreased function. The left ventricle demonstrates regional wall motion abnormalities (see scoring diagram/findings for description). The left ventricular internal cavity size was mildly dilated.  2. Right ventricular systolic  function is moderately reduced. The right ventricular size is moderately enlarged.  3. Moderate to severe mitral valve regurgitation.  4. Tricuspid valve regurgitation is moderate.  5. The aortic valve is tricuspid. Aortic valve regurgitation is not visualized. No aortic stenosis is present. FINDINGS  Left Ventricle: Left ventricular ejection fraction, by estimation, is 30 to 35%. The left ventricle has moderately decreased function. The left ventricle demonstrates regional wall motion abnormalities. The left ventricular internal cavity size was mildly dilated.  LV Wall Scoring: The inferior wall, mid inferoseptal segment, and basal inferoseptal segment are akinetic. The entire anterior wall, antero-lateral wall, entire anterior septum, entire apex, and basal inferolateral segment are hypokinetic. Right Ventricle: The right ventricular size is moderately enlarged. Right ventricular systolic function is moderately reduced. Mitral Valve: Moderate to severe mitral valve regurgitation. Tricuspid Valve: Tricuspid valve regurgitation is moderate. Aortic Valve: The aortic valve is tricuspid. Aortic valve regurgitation is not visualized. No aortic stenosis is present. LEFT VENTRICLE PLAX 2D LVIDd:         5.50 cm  LVIDs:         4.50 cm LV PW:         1.00 cm LV IVS:        0.80 cm  LV Volumes (MOD) LV vol d, MOD A2C: 247.0 ml LV vol d, MOD A4C: 178.5 ml LV vol s, MOD A2C: 149.0 ml LV vol s, MOD A4C: 107.9 ml LV SV MOD A2C:     98.0 ml LV SV MOD A4C:     178.5 ml LV SV MOD BP:      86.0 ml RIGHT VENTRICLE RV S prime:     6.74 cm/s TAPSE (M-mode): 1.1 cm MR Peak grad:   80.3 mmHg    TRICUSPID VALVE MR Mean grad:   47.7 mmHg    TR Peak grad:   24.8 mmHg MR Vmax:        448.00 cm/s  TR Vmax:        249.00 cm/s MR Vmean:       313.7 cm/s MR PISA:        0.57 cm MR PISA Radius: 0.30 cm Kardie Tobb DO Electronically signed by Jerryl Morin DO Signature Date/Time: 04/13/2024/1:47:35 PM    Final    DG Abd 1 View Result Date: 04/13/2024 CLINICAL DATA:  Evaluate NG tube placement EXAM: ABDOMEN - 1 VIEW COMPARISON:  CT 04/12/2024 . FINDINGS: The enteric tube tip and side port are below the hemidiaphragms within the gastric fundus. Multiple left kidney stones are again noted. IMPRESSION: Enteric tube tip and side port are below the hemidiaphragms within the gastric fundus. Electronically Signed   By: Kimberley Penman M.D.   On: 04/13/2024 10:33   DG CHEST PORT 1 VIEW Result Date: 04/13/2024 CLINICAL DATA:  Intubation EXAM: PORTABLE CHEST 1 VIEW COMPARISON:  08/16/2022 FINDINGS: Cardiomegaly.  Negative mediastinal contours given rotation. Enteric tube with tip reaching the stomach. Endotracheal tube with tip between the clavicular heads and carina. Right IJ line with tip at the SVC. There is no edema, consolidation, effusion, or pneumothorax. Excluded lateral left base. IMPRESSION: Unremarkable hardware. Cardiomegaly without visible acute cardiopulmonary disease. Electronically Signed   By: Ronnette Coke M.D.   On: 04/13/2024 07:12    Anti-infectives: Anti-infectives (From admission, onward)    Start     Dose/Rate Route Frequency Ordered Stop   04/14/24 1000  vancomycin (VANCOREADY) IVPB 1500 mg/300 mL        1,500 mg 150  mL/hr  over 120 Minutes Intravenous Every 24 hours 04/13/24 0731     04/14/24 0700  vancomycin (VANCOREADY) IVPB 1750 mg/350 mL  Status:  Discontinued        1,750 mg 175 mL/hr over 120 Minutes Intravenous Every 24 hours 04/13/24 0547 04/13/24 0731   04/13/24 0615  vancomycin (VANCOREADY) IVPB 1750 mg/350 mL        1,750 mg 175 mL/hr over 120 Minutes Intravenous  Once 04/13/24 0525 04/13/24 0811   04/13/24 0400  piperacillin-tazobactam (ZOSYN) IVPB 3.375 g  Status:  Discontinued        3.375 g 100 mL/hr over 30 Minutes Intravenous Every 6 hours 04/13/24 0240 04/13/24 0244   04/13/24 0400  piperacillin-tazobactam (ZOSYN) IVPB 3.375 g        3.375 g 12.5 mL/hr over 240 Minutes Intravenous Every 8 hours 04/13/24 0245     04/12/24 2245  piperacillin-tazobactam (ZOSYN) IVPB 3.375 g        3.375 g 100 mL/hr over 30 Minutes Intravenous  Once 04/12/24 2237 04/12/24 2333       Assessment/Plan: s/p Procedure(s): LAPAROSCOPY, DIAGNOSTIC (N/A) LAPAROTOMY EXCISION, SMALL INTESTINE APPLICATION, WOUND VAC Return to the OR today for possible resection and dural closure.  Left message with next of kin about procedure.[name]   Operative risks include bleeding,  Infection,  Organ injury,  Nerve injury,  Blood vessel injury,  DVT,  Pulmonary embolism,  Death,  And possible reoperation.  Medical management risks include worsening of present situation.  The success of the procedure is 50 -90 % at treating patients symptoms.   LOS: 1 day    Rodrigo Clara MD  04/14/2024

## 2024-04-14 NOTE — Anesthesia Preprocedure Evaluation (Addendum)
 Anesthesia Evaluation  Patient identified by MRN, date of birth, ID band Patient unresponsive    Reviewed: Allergy & Precautions, NPO status , Patient's Chart, lab work & pertinent test results  Airway Mallampati: Intubated       Dental  (+) Dental Advisory Given   Pulmonary neg pulmonary ROS   breath sounds clear to auscultation       Cardiovascular hypertension, Pt. on medications +CHF (LVEF 30-35%, mod RV failure)  + dysrhythmias (presented in Afib w/ RVR, has not been taking amio/eliquis  as prescribed for multiple years) Atrial Fibrillation + Valvular Problems/Murmurs (mod-severe MR) MR  Rhythm:Irregular Rate:Tachycardia  Echo 04/13/24:  1. Left ventricular ejection fraction, by estimation, is 30 to 35%. The  left ventricle has moderately decreased function. The left ventricle  demonstrates regional wall motion abnormalities (see scoring  diagram/findings for description). The left  ventricular internal cavity size was mildly dilated.   2. Right ventricular systolic function is moderately reduced. The right  ventricular size is moderately enlarged.   3. Moderate to severe mitral valve regurgitation.   4. Tricuspid valve regurgitation is moderate.   5. The aortic valve is tricuspid. Aortic valve regurgitation is not  visualized. No aortic stenosis is present.     Neuro/Psych  PSYCHIATRIC DISORDERS  Depression    negative neurological ROS     GI/Hepatic Neg liver ROS,GERD  Controlled,,  Endo/Other  Hypothyroidism    Renal/GU negative Renal ROS  negative genitourinary   Musculoskeletal negative musculoskeletal ROS (+)    Abdominal  (+) + obese  Peds  Hematology  (+) Blood dyscrasia, anemia Hb 9.1, plt 193   Anesthesia Other Findings   Reproductive/Obstetrics negative OB ROS                             Anesthesia Physical Anesthesia Plan  ASA: 4  Anesthesia Plan: General   Post-op  Pain Management: Ketamine IV*, Dilaudid IV and Ofirmev IV (intra-op)*   Induction: Intravenous  PONV Risk Score and Plan: 3 and Ondansetron, Dexamethasone, Midazolam and Treatment may vary due to age or medical condition  Airway Management Planned: Oral ETT  Additional Equipment: Arterial line  Intra-op Plan:   Post-operative Plan: Post-operative intubation/ventilation  Informed Consent: I have reviewed the patients History and Physical, chart, labs and discussed the procedure including the risks, benefits and alternatives for the proposed anesthesia with the patient or authorized representative who has indicated his/her understanding and acceptance.       Plan Discussed with: CRNA  Anesthesia Plan Comments: (Cross x 2 units Access: PIV x 2, CVC, arterial line Intubated, vented: PRVC TV 420, PEEP 5, R 20, FiO2 30%)       Anesthesia Quick Evaluation

## 2024-04-14 NOTE — Interval H&P Note (Signed)
 History and Physical Interval Note:  04/14/2024 10:43 AM  Donald Morales  has presented today for surgery, with the diagnosis of ischemic bowel.  The various methods of treatment have been discussed with the patient and family. After consideration of risks, benefits and other options for treatment, the patient has consented to  Procedure(s) with comments: LAPAROTOMY, EXPLORATORY (N/A) - POSSIBLE ABDOMINAL WOUND CLOSURE POSSIBLE SMALL BOWEL RESECTION as a surgical intervention.  The patient's history has been reviewed, patient examined, no change in status, stable for surgery.  I have reviewed the patient's chart and labs.  Questions were answered to the patient's satisfaction.   Aaron Aastcc  Andy Bannister A Loa Idler

## 2024-04-15 ENCOUNTER — Inpatient Hospital Stay (HOSPITAL_COMMUNITY)

## 2024-04-15 ENCOUNTER — Encounter (HOSPITAL_COMMUNITY): Payer: Self-pay | Admitting: Surgery

## 2024-04-15 DIAGNOSIS — A419 Sepsis, unspecified organism: Secondary | ICD-10-CM | POA: Diagnosis not present

## 2024-04-15 DIAGNOSIS — K559 Vascular disorder of intestine, unspecified: Secondary | ICD-10-CM | POA: Diagnosis not present

## 2024-04-15 DIAGNOSIS — I4891 Unspecified atrial fibrillation: Secondary | ICD-10-CM | POA: Diagnosis not present

## 2024-04-15 DIAGNOSIS — E039 Hypothyroidism, unspecified: Secondary | ICD-10-CM

## 2024-04-15 DIAGNOSIS — R6521 Severe sepsis with septic shock: Secondary | ICD-10-CM | POA: Diagnosis not present

## 2024-04-15 LAB — GLUCOSE, CAPILLARY
Glucose-Capillary: 131 mg/dL — ABNORMAL HIGH (ref 70–99)
Glucose-Capillary: 130 mg/dL — ABNORMAL HIGH (ref 70–99)
Glucose-Capillary: 139 mg/dL — ABNORMAL HIGH (ref 70–99)
Glucose-Capillary: 141 mg/dL — ABNORMAL HIGH (ref 70–99)
Glucose-Capillary: 167 mg/dL — ABNORMAL HIGH (ref 70–99)

## 2024-04-15 LAB — BASIC METABOLIC PANEL WITH GFR
Anion gap: 5 (ref 5–15)
BUN: 24 mg/dL — ABNORMAL HIGH (ref 8–23)
CO2: 22 mmol/L (ref 22–32)
Calcium: 7.6 mg/dL — ABNORMAL LOW (ref 8.9–10.3)
Chloride: 111 mmol/L (ref 98–111)
Creatinine, Ser: 1.05 mg/dL (ref 0.61–1.24)
GFR, Estimated: >60 mL/min (ref >60)
Glucose, Bld: 130 mg/dL — ABNORMAL HIGH (ref 70–99)
Potassium: 4.2 mmol/L (ref 3.5–5.1)
Sodium: 138 mmol/L (ref 135–145)

## 2024-04-15 LAB — HEPARIN LEVEL (UNFRACTIONATED)
Heparin Unfractionated: 0.26 [IU]/mL — ABNORMAL LOW (ref 0.30–0.70)
Heparin Unfractionated: 0.47 [IU]/mL (ref 0.30–0.70)
Heparin Unfractionated: 0.39 [IU]/mL (ref 0.30–0.70)

## 2024-04-15 LAB — MAGNESIUM: Magnesium: 2.3 mg/dL (ref 1.7–2.4)

## 2024-04-15 LAB — CBC
HCT: 32.7 % — ABNORMAL LOW (ref 39.0–52.0)
Hemoglobin: 9.3 g/dL — ABNORMAL LOW (ref 13.0–17.0)
MCH: 22.6 pg — ABNORMAL LOW (ref 26.0–34.0)
MCHC: 28.4 g/dL — ABNORMAL LOW (ref 30.0–36.0)
MCV: 79.6 fL — ABNORMAL LOW (ref 80.0–100.0)
Platelets: 147 10*3/uL — ABNORMAL LOW (ref 150–400)
RBC: 4.11 MIL/uL — ABNORMAL LOW (ref 4.22–5.81)
RDW: 15.7 % — ABNORMAL HIGH (ref 11.5–15.5)
WBC: 12.2 10*3/uL — ABNORMAL HIGH (ref 4.0–10.5)
nRBC: 0.8 % — ABNORMAL HIGH (ref 0.0–0.2)

## 2024-04-15 LAB — BRAIN NATRIURETIC PEPTIDE: B Natriuretic Peptide: 150.7 pg/mL — ABNORMAL HIGH (ref 0.0–100.0)

## 2024-04-15 LAB — PHOSPHORUS: Phosphorus: 3.7 mg/dL (ref 2.5–4.6)

## 2024-04-15 LAB — MRSA NEXT GEN BY PCR, NASAL: MRSA by PCR Next Gen: NOT DETECTED

## 2024-04-15 LAB — T4, FREE: Free T4: 0.66 ng/dL (ref 0.61–1.12)

## 2024-04-15 LAB — TSH: TSH: 11.769 u[IU]/mL — ABNORMAL HIGH (ref 0.350–4.500)

## 2024-04-15 LAB — SURGICAL PATHOLOGY

## 2024-04-15 MED ORDER — FENTANYL CITRATE PF 50 MCG/ML IJ SOSY: 25.0000 ug | PREFILLED_SYRINGE | INTRAMUSCULAR | Status: DC | PRN

## 2024-04-15 MED ORDER — SODIUM CHLORIDE 0.9% FLUSH
10.0000 mL | INTRAVENOUS | Status: DC | PRN
  Administered 2024-04-24: 10 mL

## 2024-04-15 MED ORDER — FENTANYL CITRATE PF 50 MCG/ML IJ SOSY
25.0000 ug | PREFILLED_SYRINGE | INTRAMUSCULAR | Status: DC | PRN
  Administered 2024-04-15 – 2024-04-24 (×17): 25 ug via INTRAVENOUS
  Filled 2024-04-15 (×18): qty 1

## 2024-04-15 MED ORDER — METOPROLOL TARTRATE 5 MG/5ML IV SOLN
2.5000 mg | Freq: Four times a day (QID) | INTRAVENOUS | Status: DC
Start: 2024-04-15 — End: 2024-04-15

## 2024-04-15 MED ORDER — DIGOXIN 0.25 MG/ML IJ SOLN
0.1250 mg | Freq: Every day | INTRAMUSCULAR | Status: DC
  Administered 2024-04-17 – 2024-04-19 (×3): 0.125 mg via INTRAVENOUS
  Filled 2024-04-15 (×3): qty 2

## 2024-04-15 MED ORDER — AMIODARONE LOAD VIA INFUSION
150.0000 mg | Freq: Once | INTRAVENOUS | Status: AC
  Administered 2024-04-15: 150 mg via INTRAVENOUS
  Filled 2024-04-15: qty 83.34

## 2024-04-15 MED ORDER — DIGOXIN 0.25 MG/ML IJ SOLN
0.5000 mg | INTRAMUSCULAR | Status: AC
  Administered 2024-04-15: 0.5 mg via INTRAVENOUS
  Filled 2024-04-15: qty 2

## 2024-04-15 MED ORDER — AMIODARONE HCL IN DEXTROSE 360-4.14 MG/200ML-% IV SOLN
60.0000 mg/h | INTRAVENOUS | Status: AC
  Administered 2024-04-15: 60 mg/h via INTRAVENOUS
  Filled 2024-04-15 (×2): qty 200

## 2024-04-15 MED ORDER — MAGNESIUM SULFATE 2 GM/50ML IV SOLN
2.0000 g | Freq: Once | INTRAVENOUS | Status: AC
  Administered 2024-04-15: 2 g via INTRAVENOUS
  Filled 2024-04-15: qty 50

## 2024-04-15 MED ORDER — DIGOXIN 0.25 MG/ML IJ SOLN
0.2500 mg | Freq: Three times a day (TID) | INTRAMUSCULAR | Status: AC
  Administered 2024-04-16 (×3): 0.25 mg via INTRAVENOUS
  Filled 2024-04-15 (×3): qty 2

## 2024-04-15 MED ORDER — TRAVASOL 10 % IV SOLN
INTRAVENOUS | Status: AC
  Filled 2024-04-15: qty 604.8

## 2024-04-15 MED ORDER — DIGOXIN 0.25 MG/ML IJ SOLN
0.2500 mg | Freq: Four times a day (QID) | INTRAMUSCULAR | Status: DC
Start: 2024-04-16 — End: 2024-04-15

## 2024-04-15 MED ORDER — AMIODARONE HCL IN DEXTROSE 360-4.14 MG/200ML-% IV SOLN
30.0000 mg/h | INTRAVENOUS | Status: DC
  Administered 2024-04-15 – 2024-04-24 (×18): 30 mg/h via INTRAVENOUS
  Filled 2024-04-15 (×17): qty 200

## 2024-04-15 MED ORDER — AMIODARONE IV BOLUS ONLY 150 MG/100ML: 150.0000 mg | Freq: Once | INTRAVENOUS | Status: DC

## 2024-04-15 MED ORDER — DIGOXIN 0.25 MG/ML IJ SOLN: 0.1250 mg | Freq: Every day | INTRAMUSCULAR | Status: DC

## 2024-04-15 MED ORDER — AMIODARONE LOAD VIA INFUSION
150.0000 mg | Freq: Once | INTRAVENOUS | Status: AC
  Administered 2024-04-15: 150 mg via INTRAVENOUS

## 2024-04-15 MED ORDER — POTASSIUM CHLORIDE 10 MEQ/50ML IV SOLN
10.0000 meq | INTRAVENOUS | Status: AC
Start: 2024-04-15 — End: 2024-04-15
  Administered 2024-04-15 (×4): 10 meq via INTRAVENOUS
  Filled 2024-04-15 (×4): qty 50

## 2024-04-15 MED ORDER — METOPROLOL TARTRATE 5 MG/5ML IV SOLN
2.5000 mg | Freq: Once | INTRAVENOUS | Status: AC
  Administered 2024-04-15: 2.5 mg via INTRAVENOUS

## 2024-04-15 MED ORDER — DEXMEDETOMIDINE HCL IN NACL 400 MCG/100ML IV SOLN
0.0000 ug/kg/h | INTRAVENOUS | Status: DC
  Administered 2024-04-15: 0.4 ug/kg/h via INTRAVENOUS
  Filled 2024-04-15: qty 100

## 2024-04-15 MED ORDER — FUROSEMIDE 10 MG/ML IJ SOLN
40.0000 mg | Freq: Once | INTRAMUSCULAR | Status: AC
  Administered 2024-04-15: 40 mg via INTRAVENOUS
  Filled 2024-04-15: qty 4

## 2024-04-15 MED ORDER — SODIUM CHLORIDE 0.9% FLUSH
10.0000 mL | Freq: Two times a day (BID) | INTRAVENOUS | Status: DC
Start: 2024-04-15 — End: 2024-04-30
  Administered 2024-04-15 – 2024-04-19 (×8): 10 mL
  Administered 2024-04-19 – 2024-04-20 (×2): 20 mL
  Administered 2024-04-20: 10 mL
  Administered 2024-04-21 (×2): 20 mL
  Administered 2024-04-22: 10 mL
  Administered 2024-04-22: 20 mL
  Administered 2024-04-23 (×2): 40 mL
  Administered 2024-04-24 – 2024-04-28 (×6): 10 mL

## 2024-04-15 MED ORDER — METOPROLOL TARTRATE 5 MG/5ML IV SOLN
2.5000 mg | Freq: Four times a day (QID) | INTRAVENOUS | Status: DC
  Administered 2024-04-15 – 2024-04-23 (×32): 2.5 mg via INTRAVENOUS
  Filled 2024-04-15 (×29): qty 5

## 2024-04-15 MED ORDER — DIGOXIN 0.25 MG/ML IJ SOLN: 0.5000 mg | INTRAMUSCULAR | Status: DC

## 2024-04-15 NOTE — Progress Notes (Signed)
 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  Allergies  Allergen Reactions   Aspirin     81 mg - epistaxis/gums bleeding   Codeine     hallucinations    Patient Measurements: Height: 5\' 9"  (175.3 cm) Weight: 94.4 kg (208 lb 1.8 oz) IBW/kg (Calculated) : 70.7 HEPARIN DW (KG): 92.4  Vital Signs: Temp: 97.4 F (36.3 C) (05/13 0433) Temp Source: Axillary (05/13 0433) BP: 102/70 (05/13 0000) Pulse Rate: 69 (05/13 0345)  Labs: Recent Labs    04/13/24 0612 04/13/24 1120 04/13/24 1608 04/13/24 2022 04/14/24 0352 04/14/24 1416  HGB 9.3*  --   --   --  9.1* 9.5*  HCT 30.8*  --   --   --  30.7* 33.2*  PLT 186  --   --   --  193 175  HEPARINUNFRC  --  0.14*  --  0.44  --   --   CREATININE 1.06  --  1.12  --  1.01  --     Estimated Creatinine Clearance (by C-G formula based on SCr of 1.01 mg/dL) Male: 16.1 mL/min Male: 82.7 mL/min   Medical History: Past Medical History:  Diagnosis Date   BPH (benign prostatic hyperplasia)    History of epistaxis    Hyperlipidemia    Muscle ache    Persistent atrial fibrillation (HCC)    Premature ventricular contraction    RBBB    RBBB    Thyroid disease    Typical atrial flutter (HCC)     Medications:  No oral anticoagulation PTA Has been on Eliquis  in the past for AFib but reports has been off meds x 2 years  Assessment: 68 yo male with abdominal pain and AFib with RVR.   CTAngio noted for concern of SMV thrombosis PMH significant for AFib (reports off medications for past 2 years).  04/15/24 Heparin level 0.26- initial level after resuming post-op 5/12 is sub-therapeutic with IV heparin infusing at 1350 units/hr No infusion interruptions per nurse. He is having some bleeding in mouth that is minimal thought to be from him biting his tongue.  CBC: Hg 9.3-low/stable; pltc 147- low/decreasing (baseline 234)  Goal  of Therapy:  Heparin level 0.3-0.7 units/ml Monitor platelets by anticoagulation protocol: Yes   Plan:  Increase IV heparin to 1450 units/hr Check heparin level in 6h Monitor daily CBC, signs/symptoms of bleeding  Thank you for allowing pharmacy to be a part of this patient's care.  Arie Kurtz, PharmD, BCPS 5/13/20254:37 AM

## 2024-04-15 NOTE — Plan of Care (Signed)

## 2024-04-15 NOTE — Progress Notes (Signed)
 PHARMACY - ANTICOAGULATION CONSULT NOTE  Pharmacy Consult for Heparin Indication: atrial fibrillation, SMV thrombosis  Allergies  Allergen Reactions   Aspirin     81 mg - epistaxis/gums bleeding   Codeine     hallucinations    Patient Measurements: Height: 5\' 9"  (175.3 cm) Weight: 95 kg (209 lb 7 oz) IBW/kg (Calculated) : 70.7 HEPARIN DW (KG): 92.4  Vital Signs: Temp: 98.3 F (36.8 C) (05/13 1555) Temp Source: Axillary (05/13 1555) BP: 112/80 (05/13 1200) Pulse Rate: 139 (05/13 1800)  Labs: Recent Labs    04/13/24 1608 04/13/24 2022 04/14/24 0352 04/14/24 1416 04/15/24 0431 04/15/24 1140 04/15/24 1751  HGB  --   --  9.1* 9.5* 9.3*  --   --   HCT  --   --  30.7* 33.2* 32.7*  --   --   PLT  --   --  193 175 147*  --   --   HEPARINUNFRC  --    < >  --   --  0.26* 0.47 0.39  CREATININE 1.12  --  1.01  --  1.05  --   --    < > = values in this interval not displayed.    Estimated Creatinine Clearance (by C-G formula based on SCr of 1.05 mg/dL) Male: 47.8 mL/min Male: 79.8 mL/min   Medical History: Past Medical History:  Diagnosis Date   BPH (benign prostatic hyperplasia)    History of epistaxis    Hyperlipidemia    Muscle ache    Persistent atrial fibrillation (HCC)    Premature ventricular contraction    RBBB    RBBB    Thyroid disease    Typical atrial flutter (HCC)     Medications:  Infusions:   amiodarone  30 mg/hr (04/15/24 1803)   heparin 1,450 Units/hr (04/15/24 1803)   piperacillin-tazobactam (ZOSYN)  IV Stopped (04/15/24 1737)   TPN ADULT (ION) 40 mL/hr at 04/15/24 1803    Assessment: 67 yoM admitted on 5/11 with abdominal pain and AFib with RVR.  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  PMH significant for AFib, but has been off medications/anticoagulation for past 2 years.  Pharmacy is consulted to dose heparin IV.  Significant events: - 5/12 Heparin held at 4am for surgery, resumed on 5/12 at 19:00  Today, 04/15/2024: Heparin  level 0.39, remains therapeutic with heparin at 1450 units/hr CBC: Hgb low stable at 9.3, Plt decreased to 147k. No bleeding or complications noted  Goal of Therapy:  Heparin level 0.3-0.7 units/ml Monitor platelets by anticoagulation protocol: Yes   Plan:  Continue heparin infusion at 1450 units/hr Daily heparin level and CBC   Lolita Rise, PharmD, BCPS Clinical Pharmacist 04/15/2024 7:09 PM

## 2024-04-15 NOTE — Progress Notes (Signed)
 eLink Physician-Brief Progress Note Patient Name: Donald Morales DOB: 10/12/1956 MRN: [id number]   Date of Service  04/15/2024  HPI/Events of Note  Notified of atrial fibrillation with rates in the 120s-140s.  Pt has been weaned off pressors.  He is on amiodarone  and cardizem  at home. Pt is on heparin gtt.   He remains intubated and sedated on fentanyl and propofol.   BP 113/75, HR 128-140s, O2 sats 100%.   eICU Interventions  Give amiodarone  150mg  IV and start on amiodarone  gtt.      Intervention Category Intermediate Interventions: Arrhythmia - evaluation and management  Lanell Pinta 04/15/2024, 6:33 AM

## 2024-04-15 NOTE — Progress Notes (Signed)
 PHARMACY - TOTAL PARENTERAL NUTRITION CONSULT NOTE   Indication: bowel obstruction  Patient Measurements: Height: 5\' 9"  (175.3 cm) Weight: 95 kg (209 lb 7 oz) IBW/kg (Calculated) : 70.7 TPN AdjBW (KG): 78.5 Body mass index is 30.93 kg/m. Usual Weight:   Assessment:  12 yoM admitted on 5/11 with abdominal pain and AFib with RVR and concern of SMV thrombosis.  S/p small bowel resection x2 with anastomosis and abdominal wall closure on 5/12.  Pharmacy is consulted to dose TPN.  Glucose / Insulin: No hx DM noted.  A1c 6.3.  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 Left in discontinuity. - 5/12 OR: 2nd look, 40 cm small bowel resection, anastomosis, abdominal wall closure  Central access: CVC 5/11 TPN start date: 5/13  Nutritional Goals: Goal TPN rate is 85 mL/hr and provides 128 g of protein and 1896 kcals per day.   RD Assessment: Estimated Needs Total Energy Estimated Needs: 1800-2200 kcals Total Protein Estimated Needs: 125-145 grams Total Fluid Estimated Needs: >/= 1.8L  Current Nutrition:  NPO  Plan:  Start TPN at 40mL/hr at 1800 Electrolytes in TPN: Na 28mEq/L, K 41mEq/L, Ca 71mEq/L, Mg 43mEq/L, and Phos 15mmol/L. Cl:Ac 1:2  Add standard MVI and trace elements to TPN Continue Moderate q4h SSI and adjust as needed  No IVF per MD. Monitor TPN labs on Mon/Thurs, Bmet, Mag, Phos with AM labs.    Kendall Pauls PharmD, BCPS [facility] main pharmacy [phone] 04/15/2024 10:53 AM

## 2024-04-15 NOTE — Progress Notes (Signed)
 1 Day Post-Op   Subjective/Chief Complaint: PT SEDATED ON VENT  A FIB    Objective: Vital signs in last 24 hours: Temp:  [97.2 F (36.2 C)-97.5 F (36.4 C)] 97.5 F (36.4 C) (05/13 0800) Pulse Rate:  [25-122] 114 (05/13 0800) Resp:  [19-21] 20 (05/13 0800) BP: (97-128)/(70-98) 97/77 (05/13 0800) SpO2:  [99 %-100 %] 100 % (05/13 0800) Arterial Line BP: (95-125)/(62-85) 105/66 (05/13 0800) FiO2 (%):  [30 %] 30 % (05/13 0815) Weight:  [95 kg] 95 kg (05/13 0444) Last BM Date :  (PTA)  Intake/Output from previous day: 05/12 0701 - 05/13 0700 In: 3131.6 [I.V.:2843.2; IV Piggyback:288.4] Out: 1335 [Urine:1225; Emesis/NG output:100; Blood:10] Intake/Output this shift: Total I/O In: 477 [I.V.:442.2; IV Piggyback:34.8] Out: -   ABD VAC IN PLACE SOFT   Lab Results:  Recent Labs    04/14/24 1416 04/15/24 0431  WBC 9.2 12.2*  HGB 9.5* 9.3*  HCT 33.2* 32.7*  PLT 175 147*   BMET Recent Labs    04/14/24 0352 04/15/24 0431  NA 137 138  K 4.5 4.2  CL 110 111  CO2 20* 22  GLUCOSE 117* 130*  BUN 22 24*  CREATININE 1.01 1.05  CALCIUM 7.7* 7.6*   PT/INR No results for input(s): "LABPROT", "INR" in the last 72 hours. ABG Recent Labs    04/13/24 0558  PHART 7.44  HCO3 19.5*    Studies/Results: DG CHEST PORT 1 VIEW Result Date: 04/15/2024 CLINICAL DATA:  [id number] Respirator dependence [facility]) [id number] EXAM: PORTABLE CHEST - 1 VIEW COMPARISON:  Apr 13, 2024 FINDINGS: Endotracheal tube terminates in the mid trachea. Right IJ approach central venous catheter terminates in the lower SVC. Esophagogastric tube courses below the diaphragm with the distal tip in the stomach. Lower lung volumes. Patchy retrocardiac airspace opacities. No pleural effusion or pneumothorax. Mild cardiomegaly. IMPRESSION: 1. Lower lung volumes. Patchy retrocardiac airspace opacities, likely atelectasis. 2. Similar positioning of the support tubes and lines, as described above. Electronically Signed   By:  Rance Burrows M.D.   On: 04/15/2024 09:25   ECHOCARDIOGRAM LIMITED Result Date: 04/13/2024    ECHOCARDIOGRAM LIMITED REPORT   Patient Name:   Donald Morales Date of Exam: 04/13/2024 Medical Rec #:  [id number]       Height:       69.0 in Accession #:    [id number]      Weight:       209.7 lb Date of Birth:  06-05-1956       BSA:          2.108 m Patient Age:    67 years        BP:           118/86 mmHg Patient Gender: M               HR:           92 bpm. Exam Location:  Inpatient Procedure: Limited Echo, Cardiac Doppler, Limited Color Doppler and Intracardiac            Opacification Agent (Both Spectral and Color Flow Doppler were            utilized during procedure). Indications:    CHF  History:        Patient has prior history of Echocardiogram examinations, most                 recent 02/07/2022. Arrythmias:Atrial Fibrillation and RBBB,  Signs/Symptoms:Shortness of Breath and Edema; Risk                 Factors:Hypertension and Dyslipidemia. PVC, PAC, lower extremity                 edema.  Sonographer:    Juanita Shaw Referring Phys: [id number] JONATHAN B DEWALD IMPRESSIONS  1. Left ventricular ejection fraction, by estimation, is 30 to 35%. The left ventricle has moderately decreased function. The left ventricle demonstrates regional wall motion abnormalities (see scoring diagram/findings for description). The left ventricular internal cavity size was mildly dilated.  2. Right ventricular systolic function is moderately reduced. The right ventricular size is moderately enlarged.  3. Moderate to severe mitral valve regurgitation.  4. Tricuspid valve regurgitation is moderate.  5. The aortic valve is tricuspid. Aortic valve regurgitation is not visualized. No aortic stenosis is present. FINDINGS  Left Ventricle: Left ventricular ejection fraction, by estimation, is 30 to 35%. The left ventricle has moderately decreased function. The left ventricle demonstrates regional wall motion abnormalities.  The left ventricular internal cavity size was mildly dilated.  LV Wall Scoring: The inferior wall, mid inferoseptal segment, and basal inferoseptal segment are akinetic. The entire anterior wall, antero-lateral wall, entire anterior septum, entire apex, and basal inferolateral segment are hypokinetic. Right Ventricle: The right ventricular size is moderately enlarged. Right ventricular systolic function is moderately reduced. Mitral Valve: Moderate to severe mitral valve regurgitation. Tricuspid Valve: Tricuspid valve regurgitation is moderate. Aortic Valve: The aortic valve is tricuspid. Aortic valve regurgitation is not visualized. No aortic stenosis is present. LEFT VENTRICLE PLAX 2D LVIDd:         5.50 cm LVIDs:         4.50 cm LV PW:         1.00 cm LV IVS:        0.80 cm  LV Volumes (MOD) LV vol d, MOD A2C: 247.0 ml LV vol d, MOD A4C: 178.5 ml LV vol s, MOD A2C: 149.0 ml LV vol s, MOD A4C: 107.9 ml LV SV MOD A2C:     98.0 ml LV SV MOD A4C:     178.5 ml LV SV MOD BP:      86.0 ml RIGHT VENTRICLE RV S prime:     6.74 cm/s TAPSE (M-mode): 1.1 cm MR Peak grad:   80.3 mmHg    TRICUSPID VALVE MR Mean grad:   47.7 mmHg    TR Peak grad:   24.8 mmHg MR Vmax:        448.00 cm/s  TR Vmax:        249.00 cm/s MR Vmean:       313.7 cm/s MR PISA:        0.57 cm MR PISA Radius: 0.30 cm Kardie Tobb DO Electronically signed by Jerryl Morin DO Signature Date/Time: 04/13/2024/1:47:35 PM    Final     Anti-infectives: Anti-infectives (From admission, onward)    Start     Dose/Rate Route Frequency Ordered Stop   04/14/24 1000  vancomycin (VANCOREADY) IVPB 1500 mg/300 mL        1,500 mg 150 mL/hr over 120 Minutes Intravenous Every 24 hours 04/13/24 0731     04/14/24 0700  vancomycin (VANCOREADY) IVPB 1750 mg/350 mL  Status:  Discontinued        1,750 mg 175 mL/hr over 120 Minutes Intravenous Every 24 hours 04/13/24 0547 04/13/24 0731   04/13/24 0615  vancomycin (VANCOREADY) IVPB 1750 mg/350 mL  1,750 mg 175 mL/hr  over 120 Minutes Intravenous  Once 04/13/24 0525 04/13/24 0811   04/13/24 0400  piperacillin-tazobactam (ZOSYN) IVPB 3.375 g  Status:  Discontinued        3.375 g 100 mL/hr over 30 Minutes Intravenous Every 6 hours 04/13/24 0240 04/13/24 0244   04/13/24 0400  piperacillin-tazobactam (ZOSYN) IVPB 3.375 g        3.375 g 12.5 mL/hr over 240 Minutes Intravenous Every 8 hours 04/13/24 0245     04/12/24 2245  piperacillin-tazobactam (ZOSYN) IVPB 3.375 g        3.375 g 100 mL/hr over 30 Minutes Intravenous  Once 04/12/24 2237 04/12/24 2333       Assessment/Plan: s/p Procedure(s) with comments: LAPAROTOMY, EXPLORATORY SMALL BOWEL RESECTION OF APPROXIMATELY 40 CM OF ILEUM W/ PRIMARY ANASTOMOSIS AND CLOSURE OF ABDOMEN AND PLACEMENT OF SUBCU WOUND VAC (N/A) - POSSIBLE ABDOMINAL WOUND CLOSURE POSSIBLE SMALL BOWEL RESECTION PER ICU TEAM MAY NEED TNA AT SOME POINT SOON DEPENDING ON CONDITION   VAC CHANGE M-W-F   LOS: 2 days    Rodrigo Clara MD  04/15/2024

## 2024-04-15 NOTE — Progress Notes (Signed)
 PHARMACY - ANTICOAGULATION CONSULT NOTE  Pharmacy Consult for Heparin Indication: atrial fibrillation, SMV thrombosis  Allergies  Allergen Reactions   Aspirin     81 mg - epistaxis/gums bleeding   Codeine     hallucinations    Patient Measurements: Height: 5\' 9"  (175.3 cm) Weight: 95 kg (209 lb 7 oz) IBW/kg (Calculated) : 70.7 HEPARIN DW (KG): 92.4  Vital Signs: Temp: 97.4 F (36.3 C) (05/13 0433) Temp Source: Axillary (05/13 0433) BP: 102/70 (05/13 0000) Pulse Rate: 98 (05/13 0700)  Labs: Recent Labs    04/13/24 1120 04/13/24 1608 04/13/24 2022 04/14/24 0352 04/14/24 1416 04/15/24 0431  HGB  --   --   --  9.1* 9.5* 9.3*  HCT  --   --   --  30.7* 33.2* 32.7*  PLT  --   --   --  193 175 147*  HEPARINUNFRC 0.14*  --  0.44  --   --  0.26*  CREATININE  --  1.12  --  1.01  --  1.05    Estimated Creatinine Clearance (by C-G formula based on SCr of 1.05 mg/dL) Male: 14.7 mL/min Male: 79.8 mL/min   Medical History: Past Medical History:  Diagnosis Date   BPH (benign prostatic hyperplasia)    History of epistaxis    Hyperlipidemia    Muscle ache    Persistent atrial fibrillation (HCC)    Premature ventricular contraction    RBBB    RBBB    Thyroid disease    Typical atrial flutter (HCC)     Medications:  Infusions:   amiodarone  60 mg/hr (04/15/24 0737)   Followed by   amiodarone      fentaNYL infusion INTRAVENOUS 100 mcg/hr (04/15/24 0737)   heparin 1,450 Units/hr (04/15/24 0737)   lactated ringers 75 mL/hr at 04/15/24 0737   norepinephrine (LEVOPHED) Adult infusion Stopped (04/14/24 2316)   phenylephrine (NEO-SYNEPHRINE) Adult infusion Stopped (04/14/24 2017)   piperacillin-tazobactam (ZOSYN)  IV 12.5 mL/hr at 04/15/24 0737   propofol (DIPRIVAN) infusion 20 mcg/kg/min (04/15/24 0737)   vancomycin Stopped (04/14/24 1106)    Assessment: 67 yoM admitted on 5/11 with abdominal pain and AFib with RVR.  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  PMH  significant for AFib, but has been off medications/anticoagulation for past 2 years.  Pharmacy is consulted to dose heparin IV.  Significant events: - 5/12 Heparin held at 4am for surgery, resumed on 5/12 at 19:00  Today, 04/15/2024: Heparin level 0.47, therapeutic on heparin at 1450 units/hr CBC: Hgb low stable at 9.3, Plt decreased to 147k. No bleeding or complications per RN  Goal of Therapy:  Heparin level 0.3-0.7 units/ml Monitor platelets by anticoagulation protocol: Yes   Plan:  Continue heparin IV infusion at 1450 units/hr Confirmatory heparin level in 6 hours Daily heparin level and CBC   Kendall Pauls PharmD, BCPS [facility] main pharmacy [phone] 04/15/2024 8:05 AM

## 2024-04-15 NOTE — Progress Notes (Signed)
 NAME:  Donald Morales, MRN:  [id number], DOB:  [birth year], LOS: 2 ADMISSION DATE:  04/12/2024, CONSULTATION DATE:  04/13/2024 REFERRING MD:  Iva Mariner, MD CHIEF COMPLAINT:  Abdominal pain   History of Present Illness:  68 year old male with a history of hypertension, thyroid disease, A-fib not taking Eliquis  or other anti-coagulation for at least 2 years, presents with abdominal pain. He states that a few weeks ago he had a URI that progressed to his abdomen. At first it seemed like it was due to his cough but over the past few days he has been having progressively worsening abdominal pain. It feels like a cramping sensation. He had been constipated without a solid bowel movement in about 5 days that he is had real small bowel movements. He has not had any fevers or vomiting. He has been having decreased urine output without dysuria.  He describes cramping abdominal pain x 1 week.    CT scan shows bowel wall thickening. There was an issue with obtaining venous phase timing so it is not definitively clear if there is SMV thrombus but per radiologist discussion with EDP, there is concern for this. Patient was taken to OR.  Pertinent  Medical History  Atrial Fibrillation  Significant Hospital Events: Including procedures, antibiotic start and stop dates in addition to other pertinent events   5/11 taken to OR for bowel ischemia, s/p laparoscopic abdominal surgery converted to open for necrotic small bowel - 40cm small bowel resected and notable mesenteric vein thromboses. Left in discontinuity to return to OR in 24-48hrs. Admitted to ICU post op 5/12 no issues overnight.  Remains on low-dose phenylephrine 5/13 OR for further bowel resection and closure of abdomen. Afib with RVR requiring amiodarone  load 150mg  and drip, weened off pressors, started anticoagulation; systemic heparin  Interim History / Subjective:  Sedated on propofol and fentanyl  Objective    Blood pressure 102/70, pulse 98,  temperature (!) 97.4 F (36.3 C), temperature source Axillary, resp. rate 20, height 5\' 9"  (1.753 m), weight 95 kg, SpO2 100%. CVP:  [10 mmHg-16 mmHg] 14 mmHg  Vent Mode: PRVC FiO2 (%):  [30 %] 30 % Set Rate:  [20 bmp] 20 bmp Vt Set:  [420 mL] 420 mL PEEP:  [5 cmH20] 5 cmH20 Plateau Pressure:  [14 cmH20-16 cmH20] 16 cmH20   Intake/Output Summary (Last 24 hours) at 04/15/2024 0744 Last data filed at 04/15/2024 9562 Gross per 24 hour  Intake 3311.39 ml  Output 1335 ml  Net 1976.39 ml   Filed Weights   04/13/24 0536 04/14/24 0500 04/15/24 0444  Weight: 95.1 kg 94.4 kg 95 kg    Examination:  General well-nourished 68 year old male sedated on ventilator HEENT normocephalic orally intubated has a right IJ double-lumen catheter sclera are nonicteric, mucous membranes are moist, nasogastric tube in place Pulmonary normal, equal chest rise, clear to auscultation on the right, diminished on the left current peak inspiratory pressures at 17 pulse oximetry 95-100% on 40% FiO2, portable chest x-ray: Personally reviewed chest x-ray that he endotracheal tube is in satisfactory position the right IJ central venous catheter terminates in the distal SVC he has some mild cardiomegaly with some volume loss in the left base, does have some minimal pulmonary edema on film Cardiac: irregular, irregular with atrial fibrillation on telemetry; no murmur rub or gallop appreciated echocardiogram reviewed below Abdomen soft, distant bowel sounds in Left lower quadrant. He has a wound VAC in place Extremities warm, trace lower and upper extremity edema,  brisk capillary refill, palpable pulses in all. 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 Will repeat MRSA PCR and if neg stop  Trending fever and wbc   HFrEF (30-35% w/ LV regional wall abnormalities as well as dilated LV and decreased RV fxn) c/b af w/ RVR - EF reduced from 2023, was 35-40% but the LV wall abnormality and RV fxn were both reduced at that point as well - normotensive without pressor support -hemoglobin stable post op; 9.3-9.5 Plan Keep euvolemic; his CVP is 14, going to give him a one-time dose of Lasix  keep map greater than 65. If increasing pressor requirement; send lactate & would start norepinephrine over phenylephrine given reduced EF Continue trend hbg post op 48 hours (at least)  HR control; use amiodarone  over diltiazem  given reduced EF and history  Atrial Fibrillation with RVR c/b Chronic Systolic Heart Failure -rate currently controlled after amiodarone  bolus and drip Plan Rate control; avoid CCB given reduced EF Continue systemic heparin K>4 Mg>2 Heparin per pharm   Post-op Ventilator Management Plan Begin weaning vent support as tolerated VAP bundle PAD protocol Goal RASS -1 A.M. chest X-ray  Anemia No current evidence of bleeding Plan Trend cbc transfuse for hemoglobin 7g/dL or less  Hyperglycemia Plan SSI goal 140-180  Hx of hypothyroidism - tsh 4.6 Plan  Synthroid replacement  Repeat T4  Best Practice (right click and "Reselect all SmartList Selections" daily)   Diet/type: NPO DVT prophylaxis systemic heparin Pressure ulcer(s): pressure ulcer assessment deferred to nursing assessment GI  prophylaxis: PPI Lines: Central line Foley:  Yes, and it is still needed Code Status:  full code Last date of multidisciplinary goals of care discussion [pending]   Critical care time: 33 min

## 2024-04-15 NOTE — Progress Notes (Signed)
 Nutrition Follow-up  DOCUMENTATION CODES:   Obesity unspecified  INTERVENTION:  - Plan to start TPN tonight.  - TPN management per pharmacy.   - Daily weights while on TPN.   NUTRITION DIAGNOSIS:   Inadequate oral intake related to inability to eat as evidenced by NPO status. *ongoing  GOAL:   Patient will meet greater than or equal to 90% of their needs *Progressing, starting TPN  MONITOR:   Vent status, Labs, Weight trends  REASON FOR ASSESSMENT:   Ventilator    ASSESSMENT:   68 y.o. male with a PMH of HTN, thyroid disease, A-fib who presented with abdominal pain. Admitted for small bowel ischemia with septic shock.  5/11 Admit; taken to OR for bowel ischemia, s/p laparoscopic abdominal surgery converted to open for necrotic small bowel - 40cm small bowel resected and notable mesenteric vein thromboses; Left in discontinuity 5/12 Taken back to OR with CCS - ex-lap with another resection of ~40cm of ileum with primary anastomosis and closure of abdomen with wound VAC 5/13 Starting TPN; Extubated  Patient still intubated at time of visit this AM. Not responsive to questions and no family/visitors at bedside.  Patient extubated this afternoon. Plan to start TPN tonight at 40mL/hr.    Admit weight: 209# Current weight: 209# I&O's: +4.1L since admit  Medications reviewed and include: -  Labs reviewed:  - HA1C 6.3 Blood Glucose 114-139 x24 hours Triglycerides 182 (as of 5/12)   NUTRITION - FOCUSED PHYSICAL EXAM:  Flowsheet Row Most Recent Value  Orbital Region Moderate depletion  Upper Arm Region No depletion  Thoracic and Lumbar Region No depletion  Buccal Region Unable to assess  Temple Region Moderate depletion  Clavicle Bone Region Mild depletion  Clavicle and Acromion Bone Region No depletion  Scapular Bone Region Unable to assess  Dorsal Hand No depletion  Patellar Region No depletion  Anterior Thigh Region No depletion  Posterior Calf Region  No depletion  Edema (RD Assessment) None  Hair Reviewed  Eyes Unable to assess  Mouth Unable to assess  Skin Reviewed  Nails Reviewed       Diet Order:   Diet Order             Diet NPO time specified  Diet effective now                   EDUCATION NEEDS:  Not appropriate for education at this time  Skin:  Skin Assessment: Skin Integrity Issues: Skin Integrity Issues:: Incisions Incisions: Abdomen  Last BM:  PTA  Height:  Ht Readings from Last 1 Encounters:  04/14/24 5\' 9"  (1.753 m)   Weight:  Wt Readings from Last 1 Encounters:  04/15/24 95 kg   Ideal Body Weight:  72.73 kg  BMI:  Body mass index is 30.93 kg/m.  Estimated Nutritional Needs:  Kcal:  1800-2200 kcals Protein:  110-125 grams Fluid:  >/= 1.8L    Scheryl Cushing RD, LDN Contact via Secure Chat.

## 2024-04-15 NOTE — Procedures (Signed)
 Extubation Procedure Note  Patient Details:   Name: Donald Morales DOB: 03-17-1956 MRN: [id number]   Airway Documentation:    Vent end date: 04/15/24 Vent end time: 1205   Evaluation  O2 sats: stable throughout and currently acceptable Complications: No apparent complications Patient did tolerate procedure well. Bilateral Breath Sounds: Clear, Diminished   Yes  Pt extubated per MD order.  Pt placed on 4L [place] with sats of 99%, RR 15, HR 89.  RN at bedside.  No issues noted.  Vent on Stby  Alinda Apley 04/15/2024, 12:27 PM

## 2024-04-16 ENCOUNTER — Inpatient Hospital Stay (HOSPITAL_COMMUNITY)

## 2024-04-16 DIAGNOSIS — K559 Vascular disorder of intestine, unspecified: Secondary | ICD-10-CM | POA: Diagnosis not present

## 2024-04-16 DIAGNOSIS — R7401 Elevation of levels of liver transaminase levels: Secondary | ICD-10-CM

## 2024-04-16 DIAGNOSIS — I4891 Unspecified atrial fibrillation: Secondary | ICD-10-CM | POA: Diagnosis not present

## 2024-04-16 DIAGNOSIS — R6521 Severe sepsis with septic shock: Secondary | ICD-10-CM | POA: Diagnosis not present

## 2024-04-16 DIAGNOSIS — A419 Sepsis, unspecified organism: Secondary | ICD-10-CM | POA: Diagnosis not present

## 2024-04-16 LAB — COMPREHENSIVE METABOLIC PANEL WITH GFR
ALT: 214 U/L — ABNORMAL HIGH (ref 0–44)
AST: 69 U/L — ABNORMAL HIGH (ref 15–41)
Albumin: 2.6 g/dL — ABNORMAL LOW (ref 3.5–5.0)
Alkaline Phosphatase: 57 U/L (ref 38–126)
Anion gap: 9 (ref 5–15)
BUN: 29 mg/dL — ABNORMAL HIGH (ref 8–23)
CO2: 22 mmol/L (ref 22–32)
Calcium: 7.5 mg/dL — ABNORMAL LOW (ref 8.9–10.3)
Chloride: 108 mmol/L (ref 98–111)
Creatinine, Ser: 0.98 mg/dL (ref 0.61–1.24)
GFR, Estimated: >60 mL/min (ref >60)
Glucose, Bld: 159 mg/dL — ABNORMAL HIGH (ref 70–99)
Potassium: 3.4 mmol/L — ABNORMAL LOW (ref 3.5–5.1)
Sodium: 139 mmol/L (ref 135–145)
Total Bilirubin: 1.1 mg/dL (ref 0.0–1.2)
Total Protein: 5.6 g/dL — ABNORMAL LOW (ref 6.5–8.1)

## 2024-04-16 LAB — GLUCOSE, CAPILLARY
Glucose-Capillary: 125 mg/dL — ABNORMAL HIGH (ref 70–99)
Glucose-Capillary: 140 mg/dL — ABNORMAL HIGH (ref 70–99)
Glucose-Capillary: 156 mg/dL — ABNORMAL HIGH (ref 70–99)
Glucose-Capillary: 162 mg/dL — ABNORMAL HIGH (ref 70–99)
Glucose-Capillary: 164 mg/dL — ABNORMAL HIGH (ref 70–99)
Glucose-Capillary: 166 mg/dL — ABNORMAL HIGH (ref 70–99)

## 2024-04-16 LAB — CBC
HCT: 30.6 % — ABNORMAL LOW (ref 39.0–52.0)
Hemoglobin: 9.1 g/dL — ABNORMAL LOW (ref 13.0–17.0)
MCH: 22.4 pg — ABNORMAL LOW (ref 26.0–34.0)
MCHC: 29.7 g/dL — ABNORMAL LOW (ref 30.0–36.0)
MCV: 75.4 fL — ABNORMAL LOW (ref 80.0–100.0)
Platelets: 160 10*3/uL (ref 150–400)
RBC: 4.06 MIL/uL — ABNORMAL LOW (ref 4.22–5.81)
RDW: 15.7 % — ABNORMAL HIGH (ref 11.5–15.5)
WBC: 10.4 10*3/uL (ref 4.0–10.5)
nRBC: 1.3 % — ABNORMAL HIGH (ref 0.0–0.2)

## 2024-04-16 LAB — SURGICAL PATHOLOGY

## 2024-04-16 LAB — DIGOXIN LEVEL: Digoxin Level: 1 ng/mL (ref 0.8–2.0)

## 2024-04-16 LAB — MAGNESIUM: Magnesium: 2.2 mg/dL (ref 1.7–2.4)

## 2024-04-16 LAB — HEPARIN LEVEL (UNFRACTIONATED): Heparin Unfractionated: 0.31 [IU]/mL (ref 0.30–0.70)

## 2024-04-16 LAB — BRAIN NATRIURETIC PEPTIDE: B Natriuretic Peptide: 169 pg/mL — ABNORMAL HIGH (ref 0.0–100.0)

## 2024-04-16 LAB — PHOSPHORUS: Phosphorus: 1.8 mg/dL — ABNORMAL LOW (ref 2.5–4.6)

## 2024-04-16 MED ORDER — LORAZEPAM 2 MG/ML IJ SOLN
1.0000 mg | Freq: Once | INTRAMUSCULAR | Status: AC
  Administered 2024-04-16: 1 mg via INTRAVENOUS
  Filled 2024-04-16: qty 1

## 2024-04-16 MED ORDER — INSULIN ASPART 100 UNIT/ML IJ SOLN
0.0000 [IU] | INTRAMUSCULAR | Status: DC
  Administered 2024-04-16 (×2): 2 [IU] via SUBCUTANEOUS
  Administered 2024-04-16: 3 [IU] via SUBCUTANEOUS
  Administered 2024-04-17 – 2024-04-19 (×10): 2 [IU] via SUBCUTANEOUS
  Administered 2024-04-19 (×2): 3 [IU] via SUBCUTANEOUS
  Administered 2024-04-19 – 2024-04-20 (×2): 2 [IU] via SUBCUTANEOUS
  Administered 2024-04-20 (×2): 3 [IU] via SUBCUTANEOUS
  Administered 2024-04-20 (×3): 2 [IU] via SUBCUTANEOUS
  Administered 2024-04-21: 3 [IU] via SUBCUTANEOUS
  Administered 2024-04-21 – 2024-04-24 (×16): 2 [IU] via SUBCUTANEOUS

## 2024-04-16 MED ORDER — HYDRALAZINE HCL 20 MG/ML IJ SOLN
10.0000 mg | Freq: Four times a day (QID) | INTRAMUSCULAR | Status: DC | PRN
  Administered 2024-04-16 – 2024-04-17 (×5): 10 mg via INTRAVENOUS
  Filled 2024-04-16 (×5): qty 1

## 2024-04-16 MED ORDER — POTASSIUM PHOSPHATES 15 MMOLE/5ML IV SOLN
20.0000 mmol | Freq: Once | INTRAVENOUS | Status: AC
  Administered 2024-04-16: 20 mmol via INTRAVENOUS
  Filled 2024-04-16: qty 6.67

## 2024-04-16 MED ORDER — TRAVASOL 10 % IV SOLN
INTRAVENOUS | Status: AC
  Filled 2024-04-16: qty 576

## 2024-04-16 MED ORDER — FUROSEMIDE 10 MG/ML IJ SOLN
40.0000 mg | Freq: Once | INTRAMUSCULAR | Status: AC
  Administered 2024-04-16: 40 mg via INTRAVENOUS
  Filled 2024-04-16: qty 4

## 2024-04-16 MED ORDER — POTASSIUM CHLORIDE 10 MEQ/50ML IV SOLN
10.0000 meq | INTRAVENOUS | Status: AC
  Administered 2024-04-16 (×2): 10 meq via INTRAVENOUS
  Filled 2024-04-16 (×2): qty 50

## 2024-04-16 NOTE — Evaluation (Signed)
 Physical Therapy Evaluation Patient Details Name: Donald Morales MRN: [id number] DOB: 1956/09/19 Today's Date: 04/16/2024  History of Present Illness  68 y/o gentleman  who presented with necrotic bowel due to venous obstruction.  On 5/11 he he required resection of necrotic bowel and was left in discontinuity.  On 5/12 he returned to the OR with a small residual area of necrotic tissue removed and was reconnected.  Wound VAC in place.  Transferred back to the ICU still on mechanical ventilation.  Pt with a history of A-fib not on anticoagulation  Clinical Impression  Pt admitted with above diagnosis. Pt reports nausea at rest and more so with elevation of head of bed. BP 170/101 at rest, HR 125 at rest. Pt was not able to tolerate bed in chair position 2* abdominal pain, he was able to tolerate HOB at ~50* elevation. +2 mod assist for pulling with BUEs on bedrails to partial upright position of trunk x 3 trials, 5 second hold. Pt performed bed level BLE exercises. Pt is oriented to self only. Pt lives alone, reports he was independent with mobility at baseline, and that he doesn't have assistance available at home. He may need post-acute rehab, depending on progress. Pt currently with functional limitations due to the deficits listed below (see PT Problem List). Pt will benefit from acute skilled PT to increase their independence and safety with mobility to allow discharge.           If plan is discharge home, recommend the following: Two people to help with walking and/or transfers;A lot of help with bathing/dressing/bathroom;Assistance with cooking/housework;Assist for transportation;Help with stairs or ramp for entrance   Can travel by private vehicle   No    Equipment Recommendations Rolling walker (2 wheels)  Recommendations for Other Services       Functional Status Assessment Patient has had a recent decline in their functional status and demonstrates the ability to make significant  improvements in function in a reasonable and predictable amount of time.     Precautions / Restrictions Precautions Precautions: Other (comment) Recall of Precautions/Restrictions: Impaired Precaution/Restrictions Comments: abdominal incision with VAC Restrictions Weight Bearing Restrictions Per Provider Order: No      Mobility  Bed Mobility Overal bed mobility: Needs Assistance             General bed mobility comments: pt reported nausea at rest, BP 170/101 supine so did not attempt sitting on EOB. Bed placed in chair position, pt not able to tolerate HOB being fully vertical so placed in semi reclined position, pt pulled on B bedrails to raise trunk x 3 trials with mod assist of 2, with 5 second hold.    Transfers                   General transfer comment: NT    Ambulation/Gait                  Stairs            Wheelchair Mobility     Tilt Bed    Modified Rankin (Stroke Patients Only)       Balance                                             Pertinent Vitals/Pain Pain Assessment Negative Vocalization: occasional moan/groan, low speech, negative/disapproving quality Facial Expression: sad,  frightened, frown Body Language: tense, distressed pacing, fidgeting Consolability: distracted or reassured by voice/touch Vocalization (extubated pts.): N/A    Home Living Family/patient expects to be discharged to:: Skilled nursing facility                   Additional Comments: reports he lives alone, does not have family assistance available, was independent with mobility. Home has 3 STE, flight of stairs to bedroom    Prior Function Prior Level of Function : Independent/Modified Independent             Mobility Comments: was independent with mobility       Extremity/Trunk Assessment   Upper Extremity Assessment Upper Extremity Assessment: Defer to OT evaluation    Lower Extremity Assessment Lower  Extremity Assessment: Generalized weakness    Cervical / Trunk Assessment Cervical / Trunk Assessment: Normal  Communication   Communication Communication: No apparent difficulties    Cognition Arousal: Lethargic Behavior During Therapy: Flat affect   PT - Cognitive impairments: Memory, No family/caregiver present to determine baseline, Orientation, Problem solving, Initiation, Sequencing   Orientation impairments: Place, Time, Situation                   PT - Cognition Comments: oriented to self only Following commands: Impaired Following commands impaired: Follows one step commands with increased time, Follows one step commands inconsistently     Cueing Cueing Techniques: Verbal cues, Tactile cues     General Comments      Exercises General Exercises - Lower Extremity Ankle Circles/Pumps: AROM, Both, 10 reps, Supine Short Arc Quad: AROM, Both, 10 reps, Supine Heel Slides: AAROM, Both, 10 reps, Supine   Assessment/Plan    PT Assessment Patient needs continued PT services  PT Problem List Decreased activity tolerance;Decreased balance;Decreased cognition;Pain;Decreased mobility       PT Treatment Interventions DME instruction;Gait training;Functional mobility training;Therapeutic exercise;Patient/family education;Therapeutic activities    PT Goals (Current goals can be found in the Care Plan section)  Acute Rehab PT Goals Patient Stated Goal: likes to mow the lawn PT Goal Formulation: Patient unable to participate in goal setting Time For Goal Achievement: 04/30/24 Potential to Achieve Goals: Good    Frequency Min 3X/week     Co-evaluation               AM-PAC PT "6 Clicks" Mobility  Outcome Measure Help needed turning from your back to your side while in a flat bed without using bedrails?: Total Help needed moving from lying on your back to sitting on the side of a flat bed without using bedrails?: Total Help needed moving to and from a bed to  a chair (including a wheelchair)?: Total Help needed standing up from a chair using your arms (e.g., wheelchair or bedside chair)?: Total Help needed to walk in hospital room?: Total Help needed climbing 3-5 steps with a railing? : Total 6 Click Score: 6    End of Session Equipment Utilized During Treatment: Gait belt Activity Tolerance: Patient limited by fatigue;Patient limited by lethargy;Treatment limited secondary to medical complications (Comment) (nausea, elevated BP) Patient left: in bed;with call bell/phone within reach;with bed alarm set Nurse Communication: Mobility status PT Visit Diagnosis: Difficulty in walking, not elsewhere classified (R26.2);Pain;Other abnormalities of gait and mobility (R26.89)    Time: 0981-1914 PT Time Calculation (min) (ACUTE ONLY): 37 min   Charges:   PT Evaluation $PT Eval Moderate Complexity: 1 Mod PT Treatments $Therapeutic Activity: 8-22 mins PT General Charges $$ ACUTE  PT VISIT: 1 Visit        [name] PT 04/16/2024  Acute Rehabilitation Services  Office [phone]

## 2024-04-16 NOTE — Progress Notes (Signed)
 PHARMACY - TOTAL PARENTERAL NUTRITION CONSULT NOTE   Indication: bowel obstruction  Patient Measurements: Height: 5\' 9"  (175.3 cm) Weight: 95 kg (209 lb 7 oz) IBW/kg (Calculated) : 70.7 TPN AdjBW (KG): 78.5 Body mass index is 30.93 kg/m. Usual Weight:   Assessment:  81 yoM admitted on 5/11 with abdominal pain and AFib with RVR and concern of SMV thrombosis.  S/p small bowel resection x2 with anastomosis and abdominal wall closure on 5/12.  Pharmacy is consulted to dose TPN.  Glucose / Insulin: No hx DM noted.  A1c 6.3.  CBGs < 180 on mSSI (16 units/ 24 hrs) - 5/10 Dexamethasone 10ms, 5/12 Dexamethasone 5mg  Electrolytes:  - K+ dropped to 3.4, phos to 1.8, Corr Ca is 8.6  - Other electrolytes are WNL  Renal: SCr <1, BUN 29 Hepatic: LFTs and Tbili mildly elevated, trended up  Intake / Output; MIVF:  - Intake: no mIVF - Output: UOP 2450 mL (1 ml/kg/hr), drains none, NG none GI Imaging: GI Surgeries / Procedures:  - 5/11 OR:  lap > open for necrotic small bowel w/ 40cm small bowel resected and notable mesenteric vein thromboses. Left in discontinuity. - 5/12 OR: 2nd look, 40 cm small bowel resection, anastomosis, abdominal wall closure  Central access: CVC 5/11 TPN start date: 5/13  Nutritional Goals: Goal TPN rate is 85 mL/hr and provides 122 g of protein and 1871 kcals per day.   RD Assessment: Estimated Needs Total Energy Estimated Needs: 1800-2200 kcals Total Protein Estimated Needs: 110-125 grams Total Fluid Estimated Needs: >/= 1.8L  Current Nutrition:  NPO  Plan:  Now - Potassium phosphate 20 mmol x 1  - Potassium chloride IV  10 meq IV x 2    Keep TPN at 40mL/hr at 1800 due drop in several electrolytes as above  Electrolytes in TPN:  Na 59mEq/L K inc to 50 mEq/L Ca inc to 8 mEq/L Mg 42mEq/L  Phos inc to 20 mmol/L.  Cl:Ac 1:2  Add standard MVI and trace elements to TPN Continue moderate  SSI I and adjust as needed  No IVF per MD. Monitor TPN labs on  Mon/Thurs,    Shanikqua Zarzycki, PharmD, BCPS 04/16/2024 9:10 AM

## 2024-04-16 NOTE — Progress Notes (Signed)
 PHARMACY - ANTICOAGULATION CONSULT NOTE  Pharmacy Consult for Heparin Indication: atrial fibrillation, SMV thrombosis  Allergies  Allergen Reactions   Aspirin     81 mg - epistaxis/gums bleeding   Codeine     hallucinations    Patient Measurements: Height: 5\' 9"  (175.3 cm) Weight: 95 kg (209 lb 7 oz) IBW/kg (Calculated) : 70.7 HEPARIN DW (KG): 92.4  Vital Signs: Temp: 98.2 F (36.8 C) (05/14 0354) Temp Source: Oral (05/14 0354) BP: 166/112 (05/14 0400) Pulse Rate: 113 (05/14 0600)  Labs: Recent Labs    04/14/24 0352 04/14/24 1416 04/15/24 0431 04/15/24 1140 04/15/24 1751 04/16/24 0530  HGB 9.1* 9.5* 9.3*  --   --  9.1*  HCT 30.7* 33.2* 32.7*  --   --  30.6*  PLT 193 175 147*  --   --  160  HEPARINUNFRC  --   --  0.26* 0.47 0.39 0.31  CREATININE 1.01  --  1.05  --   --  0.98    Estimated Creatinine Clearance (by C-G formula based on SCr of 0.98 mg/dL) Male: 13.0 mL/min Male: 85.5 mL/min   Medical History: Past Medical History:  Diagnosis Date   BPH (benign prostatic hyperplasia)    History of epistaxis    Hyperlipidemia    Muscle ache    Persistent atrial fibrillation (HCC)    Premature ventricular contraction    RBBB    RBBB    Thyroid disease    Typical atrial flutter (HCC)     Medications:  Infusions:   amiodarone  30 mg/hr (04/16/24 0732)   heparin 1,450 Units/hr (04/16/24 0732)   piperacillin-tazobactam (ZOSYN)  IV 12.5 mL/hr at 04/16/24 0732   TPN ADULT (ION) 40 mL/hr at 04/16/24 0732    Assessment: 67 yoM admitted on 5/11 with abdominal pain and AFib with RVR.  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  PMH significant for AFib, but has been off medications/anticoagulation for past 2 years.  Pharmacy is consulted to dose heparin IV.  Significant events: - 5/12 Heparin held at 4am for surgery, resumed on 5/12 at 19:00  Today, 04/16/2024: Heparin level 0.31, remains therapeutic with heparin at 1450 units/hr CBC: Hgb low stable at  9.1, Plt decreased to 160 No bleeding or complications noted per RN   Goal of Therapy:  Heparin level 0.3-0.7 units/ml Monitor platelets by anticoagulation protocol: Yes   Plan:  Continue heparin infusion at 1450 units/hr Daily heparin level and CBC    Makenzy Krist, PharmD, BCPS 04/16/2024 9:08 AM

## 2024-04-16 NOTE — Progress Notes (Signed)
 2 Days Post-Op   Subjective/Chief Complaint: Extubated , confused    Objective: Vital signs in last 24 hours: Temp:  [98 F (36.7 C)-99.3 F (37.4 C)] 98.2 F (36.8 C) (05/14 0354) Pulse Rate:  [48-139] 113 (05/14 0600) Resp:  [8-22] 11 (05/14 0600) BP: (112-166)/(80-112) 166/112 (05/14 0400) SpO2:  [93 %-100 %] 96 % (05/14 0600) Arterial Line BP: (103-164)/(62-95) 146/89 (05/14 0600) FiO2 (%):  [30 %] 30 % (05/13 1128) Last BM Date :  (PTA)  Intake/Output from previous day: 05/13 0701 - 05/14 0700 In: 2334.9 [I.V.:1663.4; IV Piggyback:671.5] Out: 2500 [Urine:2450; Emesis/NG output:50] Intake/Output this shift: Total I/O In: 183.4 [I.V.:156.7; IV Piggyback:26.7] Out: -   Abdomen: vac in place soft abdomen   Lab Results:  Recent Labs    04/15/24 0431 04/16/24 0530  WBC 12.2* 10.4  HGB 9.3* 9.1*  HCT 32.7* 30.6*  PLT 147* 160   BMET Recent Labs    04/15/24 0431 04/16/24 0530  NA 138 139  K 4.2 3.4*  CL 111 108  CO2 22 22  GLUCOSE 130* 159*  BUN 24* 29*  CREATININE 1.05 0.98  CALCIUM 7.6* 7.5*   PT/INR No results for input(s): "LABPROT", "INR" in the last 72 hours. ABG No results for input(s): "PHART", "HCO3" in the last 72 hours.  Invalid input(s): "PCO2", "PO2"  Studies/Results: DG CHEST PORT 1 VIEW Result Date: 04/15/2024 CLINICAL DATA:  [id number] Respirator dependence [facility]) [id number] EXAM: PORTABLE CHEST - 1 VIEW COMPARISON:  Apr 13, 2024 FINDINGS: Endotracheal tube terminates in the mid trachea. Right IJ approach central venous catheter terminates in the lower SVC. Esophagogastric tube courses below the diaphragm with the distal tip in the stomach. Lower lung volumes. Patchy retrocardiac airspace opacities. No pleural effusion or pneumothorax. Mild cardiomegaly. IMPRESSION: 1. Lower lung volumes. Patchy retrocardiac airspace opacities, likely atelectasis. 2. Similar positioning of the support tubes and lines, as described above. Electronically Signed   By:  Rance Burrows M.D.   On: 04/15/2024 09:25    Anti-infectives: Anti-infectives (From admission, onward)    Start     Dose/Rate Route Frequency Ordered Stop   04/14/24 1000  vancomycin (VANCOREADY) IVPB 1500 mg/300 mL  Status:  Discontinued        1,500 mg 150 mL/hr over 120 Minutes Intravenous Every 24 hours 04/13/24 0731 04/15/24 1416   04/14/24 0700  vancomycin (VANCOREADY) IVPB 1750 mg/350 mL  Status:  Discontinued        1,750 mg 175 mL/hr over 120 Minutes Intravenous Every 24 hours 04/13/24 0547 04/13/24 0731   04/13/24 0615  vancomycin (VANCOREADY) IVPB 1750 mg/350 mL        1,750 mg 175 mL/hr over 120 Minutes Intravenous  Once 04/13/24 0525 04/13/24 0811   04/13/24 0400  piperacillin-tazobactam (ZOSYN) IVPB 3.375 g  Status:  Discontinued        3.375 g 100 mL/hr over 30 Minutes Intravenous Every 6 hours 04/13/24 0240 04/13/24 0244   04/13/24 0400  piperacillin-tazobactam (ZOSYN) IVPB 3.375 g        3.375 g 12.5 mL/hr over 240 Minutes Intravenous Every 8 hours 04/13/24 0245     04/12/24 2245  piperacillin-tazobactam (ZOSYN) IVPB 3.375 g        3.375 g 100 mL/hr over 30 Minutes Intravenous  Once 04/12/24 2237 04/12/24 2333       Assessment/Plan: s/p Procedure(s) with comments: LAPAROTOMY, EXPLORATORY SMALL BOWEL RESECTION OF APPROXIMATELY 40 CM OF ILEUM W/ PRIMARY ANASTOMOSIS AND CLOSURE OF ABDOMEN AND PLACEMENT OF  SUBCU WOUND VAC (N/Donald) - POSSIBLE ABDOMINAL WOUND CLOSURE POSSIBLE SMALL BOWEL RESECTION Clamp NGT Vac change M/W/F May need TNA short term but if awake enough, can try  clear liquid diet tomorrow if NGT is removed today   LOS: 3 days    Donald Morales Donald Morales 04/16/2024

## 2024-04-16 NOTE — Progress Notes (Signed)
 NAME:  Donald Morales, MRN:  [id number], DOB:  05/20/1956, LOS: 3 ADMISSION DATE:  04/12/2024, CONSULTATION DATE:  04/13/2024 REFERRING MD:  Iva Mariner, MD CHIEF COMPLAINT:  Abdominal pain   History of Present Illness:  68 year old male with a history of hypertension, thyroid disease, A-fib not taking Eliquis  or other anti-coagulation for at least 2 years, presents with abdominal pain. He states that a few weeks ago he had a URI that progressed to his abdomen. At first it seemed like it was due to his cough but over the past few days he has been having progressively worsening abdominal pain. It feels like a cramping sensation. He had been constipated without a solid bowel movement in about 5 days that he is had real small bowel movements. He has not had any fevers or vomiting. He has been having decreased urine output without dysuria.  He describes cramping abdominal pain x 1 week.    CT scan shows bowel wall thickening. There was an issue with obtaining venous phase timing so it is not definitively clear if there is SMV thrombus but per radiologist discussion with EDP, there is concern for this. Patient was taken to OR.  Pertinent  Medical History  Atrial Fibrillation  Significant Hospital Events: Including procedures, antibiotic start and stop dates in addition to other pertinent events   5/11 taken to OR for bowel ischemia, s/p laparoscopic abdominal surgery converted to open for necrotic small bowel - 40cm small bowel resected and notable mesenteric vein thromboses. Left in discontinuity to return to OR in 24-48hrs. Admitted to ICU post op 5/12 no issues overnight.  Remains on low-dose phenylephrine 5/13 OR for further bowel resection and closure of abdomen. Afib with RVR requiring amiodarone  load 150mg  and drip, weened off pressors, started anticoagulation; systemic heparin 5/14 no issues overnight, remains extubated on TPN and amiodarone  drip  Interim History / Subjective:  Alert but  confused this a.m.  Objective    Blood pressure (!) 166/112, pulse (!) 113, temperature 98.2 F (36.8 C), temperature source Oral, resp. rate 11, height 5\' 9"  (1.753 m), weight 95 kg, SpO2 96%. CVP:  [12 mmHg] 12 mmHg  Vent Mode: CPAP;PSV FiO2 (%):  [30 %] 30 % Set Rate:  [20 bmp] 20 bmp Vt Set:  [420 mL] 420 mL PEEP:  [5 cmH20] 5 cmH20 Pressure Support:  [8 cmH20] 8 cmH20 Plateau Pressure:  [15 cmH20] 15 cmH20   Intake/Output Summary (Last 24 hours) at 04/16/2024 0813 Last data filed at 04/16/2024 0732 Gross per 24 hour  Intake 2278.82 ml  Output 2500 ml  Net -221.18 ml   Filed Weights   04/13/24 0536 04/14/24 0500 04/15/24 0444  Weight: 95.1 kg 94.4 kg 95 kg    Examination: General: Acute on chronic ill-appearing deconditioned middle-aged male lying in bed in no acute distress HEENT: ETT, MM pink/moist, PERRL,  Neuro: Alert to self, confused to situation and time CV: s1s2 regular rate and rhythm, no murmur, rubs, or gallops,  PULM: Clear to auscultation bilaterally, no increased work of breathing, no added breath sounds GI: soft, bowel sounds active in all 4 quadrants, non-tender, non-distended, distended abdomen, wound VAC in place to mid abdomen Extremities: warm/dry, no edema  Skin: no rashes or lesions    Resolved prob list   Mild Hyponatremia Septic shock  Lactic acidosis  Non-anion gap Metabolic Acidosis; suspect 2/2 hyperchloremia Postop ventilator management  Assessment & Plan:   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 from 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 now s/p small bowel resection, -Initially left in discontinuity, back to OR on 5/12 for further resection and closure Wbc 9.2-->12.2  P: Primary management per surgery Continue TPN Clamping NG tube today per surgery hopeful for advancement to clear liquids by tomorrow Wound VAC per surgery Remains on vancomycin and Zosyn, continue Trend CBC and fever curve  HFrEF (30-35%  w/ LV regional wall abnormalities as well as dilated LV and decreased RV fxn) c/b af w/ RVR - EF reduced from 2023, was 35-40% but the LV wall abnormality and RV fxn were both reduced at that point as well -hemoglobin stable post op; 9.3-9.5 Hypertension P: Goal of euvolemia Off vasopressor support this a.m. Daily assessment for to diurese Strict intake and output Optimize electrolytes As needed IV hydralazine Digoxin taper in place  Atrial Fibrillation with RVR c/b Chronic Systolic Heart Failure -rate currently controlled after amiodarone  bolus and drip P: Continuous telemetry Continue amiodarone  and heparin drip Optimize electrolytes Avoid CCB given reduced EF  Anemia -No current evidence of bleeding P: Trend CBC Hemoglobin goal greater than 7 Transfuse per protocol  Elevated LFTs likely component of shock liver -improving P:  Avoid hepatotoxins  Hyperglycemia P: SSI CBG goal 140-180 CBG checks every 4  Hx of hypothyroidism - tsh 4.6 P: Continue Synthroid supplementation   Hypokalemia Hypophosphatemia P: Open as needed   Best Practice (right click and "Reselect all SmartList Selections" daily)   Diet/type: NPO DVT prophylaxis systemic heparin Pressure ulcer(s): pressure ulcer assessment deferred to nursing assessment GI prophylaxis: PPI Lines: Central line Foley:  Yes, and it is still needed Code Status:  full code Last date of multidisciplinary goals of care discussion [pending]   Critical care time: 33 min

## 2024-04-16 NOTE — [place] FL2 (Signed)
 [place]  MEDICAID FL2 LEVEL OF CARE FORM     IDENTIFICATION  Patient Name: Donald Morales Birthdate: [birth year] Sex: adult Admission Date (Current Location): 04/12/2024  [facility] and [place] Number:  Producer, television/film/video and Address:  [facility],  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) 03/11/2015   PVC's (premature ventricular contractions) 03/11/2015   PAC (premature atrial contraction) 03/11/2015   Myalgia 03/09/2015   Adult hypothyroidism 03/09/2015   Hypothyroidism 03/09/2015   Combined fat and carbohydrate induced hyperlipemia 03/09/2015   Encounter for screening for malignant neoplasm of prostate 01/08/2015   Encounter for screening for malignant neoplasm of colon 01/08/2015   Screening examination for poliomyelitis 01/08/2015   Cardiac arrhythmia 01/08/2015   Epistaxis 01/08/2015   Enlarged prostate without lower urinary tract symptoms (luts) 01/08/2015   Allergic rhinitis 04/14/2008   Dyslipidemia 04/13/2008   DEPRESSION 04/13/2008   Essential hypertension 04/13/2008   G E R D 04/13/2008   Dyspnea on exertion 04/13/2008   ELECTROCARDIOGRAM, ABNORMAL 04/13/2008    Orientation RESPIRATION BLADDER Height & Weight     Self, Time, Place  Normal External catheter Weight: 209 lb 7 oz (95 kg) Height:  5\' 9"  (175.3 cm)  BEHAVIORAL SYMPTOMS/MOOD NEUROLOGICAL BOWEL NUTRITION  STATUS      Continent Feeding tube (Gastric tube)  AMBULATORY STATUS COMMUNICATION OF NEEDS Skin   Limited Assist Verbally Normal                       Personal Care Assistance Level of Assistance  Dressing, Feeding, Bathing Bathing Assistance: Limited assistance Feeding assistance: Limited assistance Dressing Assistance: Limited assistance     Functional Limitations Info  Speech, Hearing, Sight Sight Info: Impaired Hearing Info: Adequate Speech Info: Adequate    SPECIAL CARE FACTORS FREQUENCY  PT (By licensed PT), OT (By licensed OT)     PT Frequency: 5x/wk OT Frequency: 5x/wk            Contractures Contractures Info: Not present    Additional Factors Info  Code Status, Allergies Code Status Info: Full code Allergies Info: Aspirin  Codeine           Current Medications (04/16/2024):  This is the current hospital active medication list Current Facility-Administered Medications  Medication Dose Route Frequency Provider Last Rate Last Admin   amiodarone  (NEXTERONE  PREMIX) 360-4.14 MG/200ML-% (1.8 mg/mL) IV infusion  30 mg/hr Intravenous Continuous Lanell Pinta, MD 16.67 mL/hr at 04/16/24 1121 30 mg/hr at 04/16/24 1121   Chlorhexidine Gluconate Cloth 2 % PADS 6 each  6 each Topical Q0600 [name], PA-C   6 each at 04/16/24 0520   digoxin (LANOXIN) 0.25 MG/ML injection 0.25 mg  0.25 mg Intravenous Q8H Babcock, Peter E, NP   0.25 mg at 04/16/24 0519   Followed by   Cecily Cohen ON 04/17/2024] digoxin (LANOXIN) 0.25 MG/ML injection 0.125 mg  0.125 mg Intravenous  Daily Babcock, Peter E, NP       fentaNYL (SUBLIMAZE) injection 25 mcg  25 mcg Intravenous Q2H PRN Babcock, Peter E, NP   25 mcg at 04/16/24 2841   heparin ADULT infusion 100 units/mL (25000 units/250mL)  1,450 Units/hr Intravenous Continuous Lilliston, Andrea M, RPH 14.5 mL/hr at 04/16/24 1121 1,450 Units/hr at 04/16/24 1121   hydrALAZINE (APRESOLINE) injection 10 mg  10 mg Intravenous Q6H PRN Deneise Finlay D, NP   10 mg at 04/16/24 0950   insulin aspart (novoLOG) injection 0-15 Units  0-15 Units Subcutaneous Q4H Jaquita Merl P, DO       LORazepam (ATIVAN) injection 1 mg  1 mg Intravenous Once Harris, Whitney D, NP       metoprolol  tartrate (LOPRESSOR ) injection 2.5 mg  2.5 mg Intravenous Q6H Jaquita Merl P, DO   2.5 mg at 04/16/24 0519   ondansetron (ZOFRAN) injection 4 mg  4 mg Intravenous Q6H PRN [name], PA-C   4 mg at 04/16/24 0951   pantoprazole (PROTONIX) injection 40 mg  40 mg Intravenous Q24H [name], PA-C   40 mg at 04/15/24 1134   piperacillin-tazobactam (ZOSYN) IVPB 3.375 g  3.375 g Intravenous Q8H [name], [place]   Stopped at 04/16/24 3244   pneumococcal 20-valent conjugate vaccine (PREVNAR 20) injection 0.5 mL  0.5 mL Intramuscular Tomorrow-1000 [place] Breath R, PA-C       potassium chloride 10 mEq in 50 mL *CENTRAL LINE* IVPB  10 mEq Intravenous Q1 Hr x 2 Jaquita Merl P, DO       potassium PHOSPHATE 20 mmol in dextrose 5 % 250 mL infusion  20 mmol Intravenous Once Joesph Mussel, DO 43 mL/hr at 04/16/24 1121 Infusion Verify at 04/16/24 1121   sodium chloride flush (NS) 0.9 % injection 10-40 mL  10-40 mL Intracatheter Q12H Jaquita Merl P, DO   10 mL at 04/16/24 0102   sodium chloride flush (NS) 0.9 % injection 10-40 mL  10-40 mL Intracatheter PRN Jaquita Merl P, DO       TPN ADULT (ION)   Intravenous Continuous TPN Shade, Christine E, RPH 40 mL/hr at 04/16/24 1121 Infusion Verify at 04/16/24 1121   TPN ADULT (ION)   Intravenous Continuous TPN Joesph Mussel, DO         Discharge Medications: Please see discharge summary for a list of discharge medications.  Relevant Imaging Results:  Relevant Lab Results:   Additional Information SSN [id number]  Amaryllis Junior, [place]

## 2024-04-16 NOTE — TOC Progression Note (Signed)
 Transition of Care [facility]) - Progression Note    Patient Details  Name: Donald Morales MRN: [id number] Date of Birth: 20-Mar-1956  Transition of Care [facility]) CM/SW Contact  Amaryllis Junior, [place] Phone Number: 04/16/2024, 12:08 PM  Clinical Narrative:    Pt recommended for SNF. PASRR obtained and FL2 completed. SNF referrals faxed out and awaiting bed offers to review with pt.   Expected Discharge Plan: Home/Self Care Barriers to Discharge: Continued Medical Work up  Expected Discharge Plan and Services                                               Social Determinants of Health (SDOH) Interventions SDOH Screenings   Food Insecurity: Patient Unable To Answer (04/13/2024)  Housing: Patient Unable To Answer (04/13/2024)  Transportation Needs: Patient Unable To Answer (04/13/2024)  Utilities: Patient Unable To Answer (04/13/2024)  Social Connections: Patient Unable To Answer (04/13/2024)  Tobacco Use: Low Risk  (04/12/2024)    Readmission Risk Interventions    04/16/2024   11:56 AM  Readmission Risk Prevention Plan  Transportation Screening Complete  PCP or Specialist Appt within 5-7 Days Complete  Home Care Screening Complete  Medication Review (RN CM) Complete

## 2024-04-17 DIAGNOSIS — K559 Vascular disorder of intestine, unspecified: Secondary | ICD-10-CM | POA: Diagnosis not present

## 2024-04-17 DIAGNOSIS — R6521 Severe sepsis with septic shock: Secondary | ICD-10-CM | POA: Diagnosis not present

## 2024-04-17 DIAGNOSIS — A419 Sepsis, unspecified organism: Secondary | ICD-10-CM | POA: Diagnosis not present

## 2024-04-17 DIAGNOSIS — I4891 Unspecified atrial fibrillation: Secondary | ICD-10-CM | POA: Diagnosis not present

## 2024-04-17 LAB — CBC
HCT: 32.6 % — ABNORMAL LOW (ref 39.0–52.0)
Hemoglobin: 9.9 g/dL — ABNORMAL LOW (ref 13.0–17.0)
MCH: 22.9 pg — ABNORMAL LOW (ref 26.0–34.0)
MCHC: 30.4 g/dL (ref 30.0–36.0)
MCV: 75.5 fL — ABNORMAL LOW (ref 80.0–100.0)
Platelets: 165 10*3/uL (ref 150–400)
RBC: 4.32 MIL/uL (ref 4.22–5.81)
RDW: 15.3 % (ref 11.5–15.5)
WBC: 10.3 10*3/uL (ref 4.0–10.5)
nRBC: 1.2 % — ABNORMAL HIGH (ref 0.0–0.2)

## 2024-04-17 LAB — COMPREHENSIVE METABOLIC PANEL WITH GFR
ALT: 183 U/L — ABNORMAL HIGH (ref 0–44)
AST: 58 U/L — ABNORMAL HIGH (ref 15–41)
Albumin: 2.6 g/dL — ABNORMAL LOW (ref 3.5–5.0)
Alkaline Phosphatase: 62 U/L (ref 38–126)
Anion gap: 5 (ref 5–15)
BUN: 21 mg/dL (ref 8–23)
CO2: 27 mmol/L (ref 22–32)
Calcium: 7.7 mg/dL — ABNORMAL LOW (ref 8.9–10.3)
Chloride: 103 mmol/L (ref 98–111)
Creatinine, Ser: 0.73 mg/dL (ref 0.61–1.24)
GFR, Estimated: >60 mL/min (ref >60)
Glucose, Bld: 117 mg/dL — ABNORMAL HIGH (ref 70–99)
Potassium: 3.2 mmol/L — ABNORMAL LOW (ref 3.5–5.1)
Sodium: 135 mmol/L (ref 135–145)
Total Bilirubin: 1.6 mg/dL — ABNORMAL HIGH (ref 0.0–1.2)
Total Protein: 6 g/dL — ABNORMAL LOW (ref 6.5–8.1)

## 2024-04-17 LAB — GLUCOSE, CAPILLARY
Glucose-Capillary: 116 mg/dL — ABNORMAL HIGH (ref 70–99)
Glucose-Capillary: 116 mg/dL — ABNORMAL HIGH (ref 70–99)
Glucose-Capillary: 120 mg/dL — ABNORMAL HIGH (ref 70–99)
Glucose-Capillary: 124 mg/dL — ABNORMAL HIGH (ref 70–99)
Glucose-Capillary: 127 mg/dL — ABNORMAL HIGH (ref 70–99)
Glucose-Capillary: 129 mg/dL — ABNORMAL HIGH (ref 70–99)
Glucose-Capillary: 130 mg/dL — ABNORMAL HIGH (ref 70–99)

## 2024-04-17 LAB — PHOSPHORUS: Phosphorus: 2.3 mg/dL — ABNORMAL LOW (ref 2.5–4.6)

## 2024-04-17 LAB — TYPE AND SCREEN
ABO/RH(D): O NEG
Antibody Screen: NEGATIVE
Unit division: 0
Unit division: 0

## 2024-04-17 LAB — TRIGLYCERIDES: Triglycerides: 45 mg/dL (ref <150)

## 2024-04-17 LAB — BPAM RBC
Blood Product Expiration Date: 202506202359
Blood Product Expiration Date: 202506202359
Unit Type and Rh: 9500
Unit Type and Rh: 9500

## 2024-04-17 LAB — HEPARIN LEVEL (UNFRACTIONATED)
Heparin Unfractionated: 0.22 [IU]/mL — ABNORMAL LOW (ref 0.30–0.70)
Heparin Unfractionated: 0.24 [IU]/mL — ABNORMAL LOW (ref 0.30–0.70)
Heparin Unfractionated: 0.54 [IU]/mL (ref 0.30–0.70)

## 2024-04-17 LAB — MAGNESIUM: Magnesium: 2 mg/dL (ref 1.7–2.4)

## 2024-04-17 MED ORDER — POTASSIUM CHLORIDE 10 MEQ/50ML IV SOLN
10.0000 meq | INTRAVENOUS | Status: AC
  Administered 2024-04-17 (×4): 10 meq via INTRAVENOUS
  Filled 2024-04-17 (×4): qty 50

## 2024-04-17 MED ORDER — TRAVASOL 10 % IV SOLN
INTRAVENOUS | Status: AC
  Filled 2024-04-17: qty 864

## 2024-04-17 MED ORDER — POTASSIUM PHOSPHATES 15 MMOLE/5ML IV SOLN
15.0000 mmol | Freq: Once | INTRAVENOUS | Status: AC
  Administered 2024-04-17: 15 mmol via INTRAVENOUS
  Filled 2024-04-17: qty 5

## 2024-04-17 MED ORDER — FENTANYL CITRATE PF 50 MCG/ML IJ SOSY
25.0000 ug | PREFILLED_SYRINGE | Freq: Once | INTRAMUSCULAR | Status: AC
  Administered 2024-04-17: 25 ug via INTRAVENOUS

## 2024-04-17 MED ORDER — SCOPOLAMINE 1 MG/3DAYS TD PT72
1.0000 | MEDICATED_PATCH | TRANSDERMAL | Status: DC
  Administered 2024-04-17 – 2024-04-23 (×3): 1.5 mg via TRANSDERMAL
  Filled 2024-04-17 (×3): qty 1

## 2024-04-17 MED ORDER — HEPARIN BOLUS VIA INFUSION
1500.0000 [IU] | Freq: Once | INTRAVENOUS | Status: AC
  Administered 2024-04-17: 1500 [IU] via INTRAVENOUS
  Filled 2024-04-17: qty 1500

## 2024-04-17 MED ORDER — HEPARIN BOLUS VIA INFUSION
1000.0000 [IU] | Freq: Once | INTRAVENOUS | Status: AC
  Administered 2024-04-17: 1000 [IU] via INTRAVENOUS
  Filled 2024-04-17: qty 1000

## 2024-04-17 NOTE — Progress Notes (Signed)
 eLink Physician-Brief Progress Note Patient Name: Donald Morales DOB: 10-Oct-1956 MRN: [id number]   Date of Service  04/17/2024  HPI/Events of Note  K+ 3.2  eICU Interventions  KCL 10 meq iv Q 1 hour x 4 doses.        Donald Morales 04/17/2024, 6:35 AM

## 2024-04-17 NOTE — Evaluation (Signed)
 SLP Cancellation Note  Patient Details Name: Donald Morales MRN: [id number] DOB: Sep 26, 1956   Cancelled treatment:       Reason Eval/Treat Not Completed: Medical issues which prohibited therapy (pt reintubated after bronch over night, will continue efforts)  Has TPN   Chantal Comment 04/17/2024, 8:22 AM

## 2024-04-17 NOTE — TOC Progression Note (Signed)
 Transition of Care [facility]) - Progression Note    Patient Details  Name: Donald Morales MRN: [id number] Date of Birth: 01/16/1956  Transition of Care [facility]) CM/SW Contact  Levie Ream, RN Phone Number: 04/17/2024, 10:05 AM  Clinical Narrative:     List of bed offers: [facility]  [facility] Pref[facility]  [facility]  [facility]  [facility]   [facility]   LVM for pt's sister Lenita Quinones [phone]); list of offers left in pt's room on bedside tray; awaiting choice; pt currently on TPN; awaiting choice, ins auth needed.   Expected Discharge Plan: Home/Self Care Barriers to Discharge: Continued Medical Work up    Accepted       Expected Discharge Plan and Services                                               Social Determinants of Health (SDOH) Interventions SDOH Screenings   Food Insecurity: Patient Unable To Answer (04/13/2024)  Housing: Patient Unable To Answer (04/13/2024)  Transportation Needs: Patient Unable To Answer (04/13/2024)  Utilities: Patient Unable To Answer (04/13/2024)  Social Connections: Patient Unable To Answer (04/13/2024)  Tobacco Use: Low Risk  (04/12/2024)    Readmission Risk Interventions    04/16/2024   11:56 AM  Readmission Risk Prevention Plan  Transportation Screening Complete  PCP or Specialist Appt within 5-7 Days Complete  Home Care Screening Complete  Medication Review (RN CM) Complete

## 2024-04-17 NOTE — Progress Notes (Addendum)
 PHARMACY - ANTICOAGULATION CONSULT NOTE  Pharmacy Consult for Heparin Indication: atrial fibrillation, SMV thrombosis  Allergies  Allergen Reactions   Aspirin     81 mg - epistaxis/gums bleeding   Codeine     hallucinations    Patient Measurements: Height: 5\' 9"  (175.3 cm) Weight: 95 kg (209 lb 7 oz) IBW/kg (Calculated) : 70.7 HEPARIN DW (KG): 92.4  Vital Signs: Temp: 98.9 F (37.2 C) (05/15 0347) Temp Source: Oral (05/15 0347) BP: 152/78 (05/15 0000) Pulse Rate: 95 (05/15 0000)  Labs: Recent Labs    04/15/24 0431 04/15/24 1140 04/15/24 1751 04/16/24 0530 04/17/24 0510  HGB 9.3*  --   --  9.1* 9.9*  HCT 32.7*  --   --  30.6* 32.6*  PLT 147*  --   --  160 165  HEPARINUNFRC 0.26*   < > 0.39 0.31 0.24*  CREATININE 1.05  --   --  0.98 0.73   < > = values in this interval not displayed.    Estimated Creatinine Clearance (by C-G formula based on SCr of 0.73 mg/dL) Male: 86 mL/min Male: 104.7 mL/min   Medical History: Past Medical History:  Diagnosis Date   BPH (benign prostatic hyperplasia)    History of epistaxis    Hyperlipidemia    Muscle ache    Persistent atrial fibrillation (HCC)    Premature ventricular contraction    RBBB    RBBB    Thyroid disease    Typical atrial flutter (HCC)     Medications:  Infusions:   amiodarone  30 mg/hr (04/17/24 0418)   heparin 1,450 Units/hr (04/17/24 0400)   piperacillin-tazobactam (ZOSYN)  IV 3.375 g (04/17/24 0525)   potassium chloride     TPN ADULT (ION) 40 mL/hr at 04/17/24 0400    Assessment: 67 yoM admitted on 5/11 with abdominal pain and AFib with RVR.  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  PMH significant for AFib, but has been off medications/anticoagulation for past 2 years.  Pharmacy is consulted to dose heparin IV.  Significant events: - 5/12 Heparin held at 4am for surgery, resumed on 5/12 at 19:00  Today, 04/17/2024: Heparin level 0.24, decreased to sub-therapeutic on heparin 1450  units/hr CBC: Hgb low stable at 9.9, Plt 165 No bleeding or complications noted per RN   Goal of Therapy:  Heparin level 0.3-0.7 units/ml Monitor platelets by anticoagulation protocol: Yes   Plan:  Give heparin 1000 units bolus IV x 1 Increase to heparin IV infusion at 1650 units/hr Heparin level 6 hours after rate change Daily heparin level and CBC  Kendall Pauls PharmD, BCPS [facility] main pharmacy [phone] 04/17/2024 7:09 AM     Addendum:  2:42 PM  Heparin level decreased to 0.22, subtherapeutic, despite previous bolus and rate increase. Heparin currently infusing at 1650 ml/hr. No bleeding or complication, IV issues or pauses per RN.   Plan: Give heparin 1500 units bolus IV x 1 Increase to heparin IV infusion at 1850 units/hr Heparin level 6 hours after rate change Daily heparin level and CBC  Kendall Pauls PharmD, BCPS [facility] main pharmacy [phone] 04/17/2024 2:42 PM

## 2024-04-17 NOTE — Progress Notes (Signed)
 Progress Note  3 Days Post-Op  Subjective: Pt with ongoing nausea, did not tolerate clamping yesterday. No flatus.   Objective: Vital signs in last 24 hours: Temp:  [98.3 F (36.8 C)-100.3 F (37.9 C)] 98.3 F (36.8 C) (05/15 0800) Pulse Rate:  [93-145] 99 (05/15 0700) Resp:  [11-20] 12 (05/15 0700) BP: (132-175)/(75-112) 159/81 (05/15 0400) SpO2:  [92 %-97 %] 96 % (05/15 0700) Arterial Line BP: (110-168)/(65-114) 117/83 (05/15 0600) Weight:  [95.9 kg] 95.9 kg (05/15 0702) Last BM Date :  (PTA)  Intake/Output from previous day: 05/14 0701 - 05/15 0700 In: 1627 [I.V.:1469.6; IV Piggyback:157.4] Out: 6175 [Urine:5675; Emesis/NG output:450; Drains:50] Intake/Output this shift: Total I/O In: 412.1 [I.V.:309.1; IV Piggyback:103] Out: -   PE: General: pleasant, WD, WN male who is laying in bed in NAD Heart: irregularly irregular Lungs: Respiratory effort nonlabored Abd: soft, appropriately ttp, midline wound clean and measured 12 x 2.5x 3 cm, NGT with bilious fluid Psych: A&Ox3 with an appropriate affect.    Lab Results:  Recent Labs    04/16/24 0530 04/17/24 0510  WBC 10.4 10.3  HGB 9.1* 9.9*  HCT 30.6* 32.6*  PLT 160 165   BMET Recent Labs    04/16/24 0530 04/17/24 0510  NA 139 135  K 3.4* 3.2*  CL 108 103  CO2 22 27  GLUCOSE 159* 117*  BUN 29* 21  CREATININE 0.98 0.73  CALCIUM 7.5* 7.7*   PT/INR No results for input(s): "LABPROT", "INR" in the last 72 hours. CMP     Component Value Date/Time   NA 135 04/17/2024 0510   K 3.2 (L) 04/17/2024 0510   CL 103 04/17/2024 0510   CO2 27 04/17/2024 0510   GLUCOSE 117 (H) 04/17/2024 0510   BUN 21 04/17/2024 0510   CREATININE 0.73 04/17/2024 0510   CALCIUM 7.7 (L) 04/17/2024 0510   PROT 6.0 (L) 04/17/2024 0510   ALBUMIN 2.6 (L) 04/17/2024 0510   AST 58 (H) 04/17/2024 0510   ALT 183 (H) 04/17/2024 0510   ALKPHOS 62 04/17/2024 0510   BILITOT 1.6 (H) 04/17/2024 0510   GFRNONAA >60 04/17/2024 0510    Lipase     Component Value Date/Time   LIPASE 52 (H) 04/12/2024 2049       Studies/Results: DG Abd 1 View Result Date: 04/16/2024 CLINICAL DATA:  NG tube placement EXAM: ABDOMEN - 1 VIEW limited portable semi upright view for tube placement COMPARISON:  X-ray 04/13/2024 FINDINGS: Limited x-ray for tube placement has tip extending overlying the right upper quadrant of the abdomen, likely distal stomach or proximal duodenum. Elsewhere there is some gas in nondilated loops of colon. Minimal small bowel gas. Overlapping cardiac leads. Note is made of cardiomegaly. IMPRESSION: Limited x-ray for tube placement has tip overlying the distal stomach or proximal duodenum in the right upper abdomen. Electronically Signed   By: Adrianna Horde M.D.   On: 04/16/2024 13:48    Anti-infectives: Anti-infectives (From admission, onward)    Start     Dose/Rate Route Frequency Ordered Stop   04/14/24 1000  vancomycin (VANCOREADY) IVPB 1500 mg/300 mL  Status:  Discontinued        1,500 mg 150 mL/hr over 120 Minutes Intravenous Every 24 hours 04/13/24 0731 04/15/24 1416   04/14/24 0700  vancomycin (VANCOREADY) IVPB 1750 mg/350 mL  Status:  Discontinued        1,750 mg 175 mL/hr over 120 Minutes Intravenous Every 24 hours 04/13/24 0547 04/13/24 0731   04/13/24 0615  vancomycin (VANCOREADY) IVPB 1750 mg/350 mL        1,750 mg 175 mL/hr over 120 Minutes Intravenous  Once 04/13/24 0525 04/13/24 0811   04/13/24 0400  piperacillin-tazobactam (ZOSYN) IVPB 3.375 g  Status:  Discontinued        3.375 g 100 mL/hr over 30 Minutes Intravenous Every 6 hours 04/13/24 0240 04/13/24 0244   04/13/24 0400  piperacillin-tazobactam (ZOSYN) IVPB 3.375 g        3.375 g 12.5 mL/hr over 240 Minutes Intravenous Every 8 hours 04/13/24 0245     04/12/24 2245  piperacillin-tazobactam (ZOSYN) IVPB 3.375 g        3.375 g 100 mL/hr over 30 Minutes Intravenous  Once 04/12/24 2237 04/12/24 2333        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 Ramiro Burly POD3 ex-lap, resection 40 cm ileum with primary anastomosis and closure of abdomen Dr. Afton Horse - still having a lot of nausea, continue TPN and NGT on LIWS - mobilize as tolerated  - ok to DC foley from surgical standpoint - VAC changed with bedside RN this AM - continue to change M-R - ok to DC abx after tomorrow from surgery standpoint   FEN: NPO, TPN, NGT to LIWS VTE: hep gtt ID: [id number] 5/10>>  - per CCM -  HFrEF HTN Thyroid disease A. Fib not on anticoagulation  LOS: 4 days     Donald Morales, [facility] 04/17/2024, 10:31 AM Please see Amion for pager number during day hours 7:00am-4:30pm

## 2024-04-17 NOTE — Progress Notes (Signed)
 PHARMACY - TOTAL PARENTERAL NUTRITION CONSULT NOTE   Indication: bowel obstruction  Patient Measurements: Height: 5\' 9"  (175.3 cm) Weight: 95 kg (209 lb 7 oz) IBW/kg (Calculated) : 70.7 TPN AdjBW (KG): 78.5 Body mass index is 30.93 kg/m. Usual Weight:   Assessment:  39 yoM admitted on 5/11 with abdominal pain and AFib with RVR and concern of SMV thrombosis.  S/p small bowel resection x2 with anastomosis and abdominal wall closure on 5/12.  Pharmacy is consulted to dose TPN.  Glucose / Insulin: No hx DM noted.  A1c 6.3.   - 5/10 Dexamethasone 10mg , 5/12 Dexamethasone 5mg  - CBGs < 180 (range 116-166) on mSSI (12 units/ 24 hrs) Electrolytes: Elytes WNL except:  K+ down to 3.2, phos low/improved at 2.3, Corr Ca is 8.8 Renal: SCr and BUN WNL Hepatic: LFTs and Tbili mildly elevated, trended down.  Tbili up to 1.6 - TG 45 (5/15) Intake / Output; MIVF:  - Intake: no mIVF.  Lasix IV x1 on 5/14 - Output: UOP 5675 mL, drains 50 mL, NG up to 350 mL (+nausea)  GI Imaging: GI Surgeries / Procedures:  - 5/11 OR:  lap > open for necrotic small bowel w/ 40cm small bowel resected and notable mesenteric vein thromboses. Left in discontinuity. - 5/12 OR: 2nd look, 40 cm small bowel resection, anastomosis, abdominal wall closure  Central access: CVC 5/11 TPN start date: 5/13  Nutritional Goals: Goal TPN rate is 85 mL/hr and provides 122 g of protein and 1871 kcals per day.   RD Assessment: Estimated Needs Total Energy Estimated Needs: 1800-2200 kcals Total Protein Estimated Needs: 110-125 grams Total Fluid Estimated Needs: >/= 1.8L  Current Nutrition:  NPO  Plan:  Now - Potassium chloride IV  10 meq IV x 4 per MD - K Phos 20 mmmol IV x1 (also provides ~30 mEq K)  Advance TPN to 56mL/hr at 1800. Electrolytes in TPN:  Na inc to 75 mEq/L K inc to 60 mEq/L Ca 8 mEq/L Mg 26mEq/L  Phos 20 mmol/L Cl:Ac 1:1 Add standard MVI and trace elements to TPN Continue moderate SSI and adjust as  needed  No IVF per MD. Monitor TPN labs on Mon/Thurs, recheck bmet, mag, phos with AM labs    Kendall Pauls PharmD, BCPS [facility] main pharmacy [phone] 04/17/2024 7:22 AM

## 2024-04-17 NOTE — Progress Notes (Signed)
 NAME:  JAMESPAUL CEDERQUIST, MRN:  [id number], DOB:  11/22/1956, LOS: 4 ADMISSION DATE:  04/12/2024, CONSULTATION DATE:  04/13/2024 REFERRING MD:  Iva Mariner, MD CHIEF COMPLAINT:  Abdominal pain   History of Present Illness:  68 year old male with a history of hypertension, thyroid disease, A-fib not taking Eliquis  or other anti-coagulation for at least 2 years, presents with abdominal pain. He states that a few weeks ago he had a URI that progressed to his abdomen. At first it seemed like it was due to his cough but over the past few days he has been having progressively worsening abdominal pain. It feels like a cramping sensation. He had been constipated without a solid bowel movement in about 5 days that he is had real small bowel movements. He has not had any fevers or vomiting. He has been having decreased urine output without dysuria.  He describes cramping abdominal pain x 1 week.    CT scan shows bowel wall thickening. There was an issue with obtaining venous phase timing so it is not definitively clear if there is SMV thrombus but per radiologist discussion with EDP, there is concern for this. Patient was taken to OR.  Pertinent  Medical History  Atrial Fibrillation  Significant Hospital Events: Including procedures, antibiotic start and stop dates in addition to other pertinent events   5/11 taken to OR for bowel ischemia, s/p laparoscopic abdominal surgery converted to open for necrotic small bowel - 40cm small bowel resected and notable mesenteric vein thromboses. Left in discontinuity to return to OR in 24-48hrs. Admitted to ICU post op 5/12 no issues overnight.  Remains on low-dose phenylephrine 5/13 OR for further bowel resection and closure of abdomen. Afib with RVR requiring amiodarone  load 150mg  and drip, weened off pressors, started anticoagulation; systemic heparin 5/14 no issues overnight, remains extubated on TPN and amiodarone  drip 5/15 no issues overnight, mentation improved  this a.m.  Interim History / Subjective:  More alert and oriented this morning, improved nausea per patient  Objective    Blood pressure (!) 159/81, pulse 99, temperature 98.3 F (36.8 C), temperature source Oral, resp. rate 12, height 5\' 9"  (1.753 m), weight 95.9 kg, SpO2 96%.        Intake/Output Summary (Last 24 hours) at 04/17/2024 1009 Last data filed at 04/17/2024 1008 Gross per 24 hour  Intake 1855.78 ml  Output 6175 ml  Net -4319.22 ml   Filed Weights   04/14/24 0500 04/15/24 0444 04/17/24 0702  Weight: 94.4 kg 95 kg 95.9 kg    Examination: General: Acute on chronic ill-appearing middle-age male lying in bed in no acute distress HEENT: /AT, MM pink/moist, PERRL,  Neuro: Alert and oriented x 3, nonfocal CV: s1s2 regular rate and rhythm, no murmur, rubs, or gallops,  PULM: Clear to auscultation bilaterally, no increased work of breathing, no added breath sounds GI: soft, bowel sounds active in all 4 quadrants, non-tender, non-distended Extremities: warm/dry, no edema  Skin: no rashes or lesions     Resolved prob list   Mild Hyponatremia Septic shock  Lactic acidosis  Non-anion gap Metabolic Acidosis; suspect 2/2 hyperchloremia Postop ventilator management  Assessment & Plan:   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 from presumed bacterial translocation now s/p small bowel resection, -Initially left in discontinuity, back to OR on 5/12 for further resection and closure Wbc 9.2-->12.2  P: Primary management per surgery Continue TPN NG tube reclamped afternoon 5/14 given nausea output minimal retrial clamping trial today Wound  VAC per surgery Remains on vancomycin and Zosyn, continue Trend CBC and fever curve  HFrEF (30-35% w/ LV regional wall abnormalities as well as dilated LV and decreased RV fxn) c/b af w/ RVR - EF reduced from 2023, was 35-40% but the LV wall abnormality and RV fxn were both reduced at that point as  well -hemoglobin stable post op; 9.3-9.5 Hypertension P: Goal of euvolemia Daily assessment for need to diurese Strict intake and output Optimize electrolytes Digoxin taper in place Diuresed well in the last 24 hours with low-dose diuretic  Atrial Fibrillation with RVR c/b Chronic Systolic Heart Failure -rate currently controlled after amiodarone  bolus and drip P: Continuous telemetry Remains on amiodarone  and heparin drips Optimize electrolytes  Anemia -No current evidence of bleeding P: Trend CBC Hemoglobin goal greater than 7 Transfuse per protocol  Elevated LFTs likely component of shock liver -improving P:  Avoid hepatotoxins  Hyperglycemia P: SSI  CBG goal 140-180 CBG check q4  Hx of hypothyroidism - tsh 4.6 P: Continue Synthroid    Hypokalemia Hypophosphatemia P: Supplement as needed    Best Practice (right click and "Reselect all SmartList Selections" daily)   Diet/type: NPO DVT prophylaxis systemic heparin Pressure ulcer(s): pressure ulcer assessment deferred to nursing assessment GI prophylaxis: PPI Lines: Central line Foley:  Yes, and it is still needed Code Status:  full code Last date of multidisciplinary goals of care discussion [pending]   Critical care time:   CRITICAL CARE Performed by: Askia Hazelip D. Harris   Total critical care time: 38 minutes  Critical care time was exclusive of separately billable procedures and treating other patients.  Critical care was necessary to treat or prevent imminent or life-threatening deterioration.  Critical care was time spent personally by me on the following activities: development of treatment plan with patient and/or surrogate as well as nursing, discussions with consultants, evaluation of patient's response to treatment, examination of patient, obtaining history from patient or surrogate, ordering and performing treatments and interventions, ordering and review of laboratory studies, ordering  and review of radiographic studies, pulse oximetry and re-evaluation of patient's condition.  Christoher Drudge D. Harris, NP-C [facility] Personal contact information can be found on Amion  If no contact or response made please call [phone] 04/17/2024, 10:17 AM

## 2024-04-18 DIAGNOSIS — K559 Vascular disorder of intestine, unspecified: Secondary | ICD-10-CM

## 2024-04-18 LAB — BASIC METABOLIC PANEL WITH GFR
Anion gap: 6 (ref 5–15)
BUN: 18 mg/dL (ref 8–23)
CO2: 23 mmol/L (ref 22–32)
Calcium: 7.8 mg/dL — ABNORMAL LOW (ref 8.9–10.3)
Chloride: 102 mmol/L (ref 98–111)
Creatinine, Ser: 0.78 mg/dL (ref 0.61–1.24)
GFR, Estimated: >60 mL/min (ref >60)
Glucose, Bld: 128 mg/dL — ABNORMAL HIGH (ref 70–99)
Potassium: 3.7 mmol/L (ref 3.5–5.1)
Sodium: 131 mmol/L — ABNORMAL LOW (ref 135–145)

## 2024-04-18 LAB — CBC
HCT: 34 % — ABNORMAL LOW (ref 39.0–52.0)
Hemoglobin: 10.1 g/dL — ABNORMAL LOW (ref 13.0–17.0)
MCH: 22.4 pg — ABNORMAL LOW (ref 26.0–34.0)
MCHC: 29.7 g/dL — ABNORMAL LOW (ref 30.0–36.0)
MCV: 75.4 fL — ABNORMAL LOW (ref 80.0–100.0)
Platelets: 169 10*3/uL (ref 150–400)
RBC: 4.51 MIL/uL (ref 4.22–5.81)
RDW: 15.8 % — ABNORMAL HIGH (ref 11.5–15.5)
WBC: 9 10*3/uL (ref 4.0–10.5)
nRBC: 1.7 % — ABNORMAL HIGH (ref 0.0–0.2)

## 2024-04-18 LAB — GLUCOSE, CAPILLARY
Glucose-Capillary: 115 mg/dL — ABNORMAL HIGH (ref 70–99)
Glucose-Capillary: 116 mg/dL — ABNORMAL HIGH (ref 70–99)
Glucose-Capillary: 122 mg/dL — ABNORMAL HIGH (ref 70–99)
Glucose-Capillary: 128 mg/dL — ABNORMAL HIGH (ref 70–99)
Glucose-Capillary: 134 mg/dL — ABNORMAL HIGH (ref 70–99)
Glucose-Capillary: 138 mg/dL — ABNORMAL HIGH (ref 70–99)

## 2024-04-18 LAB — HEPARIN LEVEL (UNFRACTIONATED): Heparin Unfractionated: 0.48 [IU]/mL (ref 0.30–0.70)

## 2024-04-18 LAB — PHOSPHORUS: Phosphorus: 2.9 mg/dL (ref 2.5–4.6)

## 2024-04-18 LAB — MAGNESIUM: Magnesium: 1.9 mg/dL (ref 1.7–2.4)

## 2024-04-18 MED ORDER — POTASSIUM CHLORIDE 10 MEQ/50ML IV SOLN
10.0000 meq | INTRAVENOUS | Status: AC
  Administered 2024-04-18 (×3): 10 meq via INTRAVENOUS
  Filled 2024-04-18: qty 50

## 2024-04-18 MED ORDER — LEVOTHYROXINE SODIUM 100 MCG/5ML IV SOLN: 25.0000 ug | Freq: Every day | INTRAVENOUS | Status: DC

## 2024-04-18 MED ORDER — TRAVASOL 10 % IV SOLN
INTRAVENOUS | Status: AC
  Filled 2024-04-18: qty 1224

## 2024-04-18 MED ORDER — TAMSULOSIN HCL 0.4 MG PO CAPS
0.4000 mg | ORAL_CAPSULE | Freq: Every day | ORAL | Status: DC
  Administered 2024-04-19 – 2024-04-29 (×9): 0.4 mg via ORAL
  Filled 2024-04-18 (×10): qty 1

## 2024-04-18 NOTE — TOC Progression Note (Addendum)
 Transition of Care [facility]) - Progression Note    Patient Details  Name: Donald Morales MRN: [id number] Date of Birth: [birth year]  Transition of Care [facility]) CM/SW Contact  Kathryn Parish, RN Phone Number: 04/18/2024, 9:37 AM  Clinical Narrative:     NCM called Diana sister and left message to select SNF. 10:42 AM [facility] return call. States she has CP and has not been to hospital and not aware of SNF list. NCM asked if I could send a picture to cell phone [phone]. [facility] said yes. Screen shot sent. [facility] SNF Accepted -- [address] [place] [address] [phone] --  [facility]  SNF Accepted -- [address] [place] [address] [phone] --  [facility] Accepted -- [address] [place], [place] [address] [phone] --  [facility] SNF Accepted -- [address] [place] [address] [phone] --  [facility], SNF Accepted -- [address], [place] [address] [phone] --  [facility] SNF Accepted -- [address] [place] [place] [address] [phone]     3:05 PM NCM reached out to Dr. Gwynneth Lessen to consider LTAC.MD wants to wait still Monday. He feels patient my be off tube feeding. However, patient has multiple wounds and will require PT. Asked [facility] to evaluate for consideration.  Expected Discharge Plan: Home/Self Care Barriers to Discharge: Continued Medical Work up  Expected Discharge Plan and Services                                               Social Determinants of Health (SDOH) Interventions SDOH Screenings   Food Insecurity: Patient Unable To Answer (04/13/2024)  Housing: Patient Unable To Answer (04/13/2024)  Transportation Needs: Patient Unable To Answer (04/13/2024)  Utilities: Patient Unable To Answer (04/13/2024)  Social Connections: Patient Unable To Answer (04/13/2024)  Tobacco Use: Low  Risk  (04/12/2024)    Readmission Risk Interventions    04/16/2024   11:56 AM  Readmission Risk Prevention Plan  Transportation Screening Complete  PCP or Specialist Appt within 5-7 Days Complete  Home Care Screening Complete  Medication Review (RN CM) Complete

## 2024-04-18 NOTE — Progress Notes (Signed)
 PHARMACY - ANTICOAGULATION CONSULT NOTE  Pharmacy Consult for Heparin Indication: atrial fibrillation, SMV thrombosis  Allergies  Allergen Reactions   Aspirin     81 mg - epistaxis/gums bleeding   Codeine     hallucinations    Patient Measurements: Height: 5\' 9"  (175.3 cm) Weight: 95.9 kg (211 lb 6.7 oz) IBW/kg (Calculated) : 70.7 HEPARIN DW (KG): 92.4  Vital Signs: Temp: 98 F (36.7 C) (05/15 2306) Temp Source: Oral (05/15 2306) BP: 134/82 (05/15 2300) Pulse Rate: 96 (05/15 2300)  Labs: Recent Labs    04/15/24 0431 04/15/24 1140 04/16/24 0530 04/17/24 0510 04/17/24 1415 04/17/24 2338  HGB 9.3*  --  9.1* 9.9*  --   --   HCT 32.7*  --  30.6* 32.6*  --   --   PLT 147*  --  160 165  --   --   HEPARINUNFRC 0.26*   < > 0.31 0.24* 0.22* 0.54  CREATININE 1.05  --  0.98 0.73  --   --    < > = values in this interval not displayed.    Estimated Creatinine Clearance (by C-G formula based on SCr of 0.73 mg/dL) Male: 40.9 mL/min Male: 105.2 mL/min   Medical History: Past Medical History:  Diagnosis Date   BPH (benign prostatic hyperplasia)    History of epistaxis    Hyperlipidemia    Muscle ache    Persistent atrial fibrillation (HCC)    Premature ventricular contraction    RBBB    RBBB    Thyroid disease    Typical atrial flutter (HCC)     Medications:  Infusions:   amiodarone  30 mg/hr (04/17/24 2300)   heparin 1,850 Units/hr (04/17/24 2300)   piperacillin-tazobactam (ZOSYN)  IV 12.5 mL/hr at 04/17/24 2300   TPN ADULT (ION) 60 mL/hr at 04/17/24 2300    Assessment: 67 yoM admitted on 5/11 with abdominal pain and AFib with RVR.  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  PMH significant for AFib, but has been off medications/anticoagulation for past 2 years.  Pharmacy is consulted to dose heparin IV.  Significant events: - 5/12 Heparin held at 4am for surgery, resumed on 5/12 at 19:00  Today, 04/18/2024: HL 0.54 therapeutic on 1850 units/hr No  bleeding reported  Goal of Therapy:  Heparin level 0.3-0.7 units/ml Monitor platelets by anticoagulation protocol: Yes   Plan:  Continue heparin drip at 1850 units/hr Confirmatory HL with am labs Daily heparin level and CBC    Beau Bound RPh 04/18/2024, 12:04 AM

## 2024-04-18 NOTE — Progress Notes (Signed)
 PROGRESS NOTE  Donald Morales  DOB: 1956-08-18  PCP: Rae Bugler, MD ZOX:[id number]  DOA: 04/12/2024  LOS: 5 days  Hospital Day: 7  Brief narrative: Donald Morales is a 68 y.o. adult with PMH significant for HTN, HLD, atrial flutter not on anticoagulation, hypothyroidism, BPH. 5/11, patient presented to the ED with progressive abdominal pain for few weeks worse on last few days.  Last BM was 5 days prior to presentation.  CT abdomen showed bowel wall thickening and also raised concern for SMV thrombosis.  Labs showed WC count of 12.1 and lactic acid elevated 2.8 and trended up to 4.9  Patient was seen by general surgery and taken to the OR emergently. Underwent diagnostic laparoscopy, converted to open.  Underwent resection of 40 cm of necrotic small bowel.  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 Left in discontinuity to return to OR in 24-48hrs.  Patient was admitted to ICU postop.  Required 20 phenylephrine drip. 5/12, taken to OR for further bowel resection and closure of abdomen.  Postop, patient had Afib with RVR required amiodarone .  Eventually weaned off pressors, started on TPN,anticoagulation with IV heparin drip.  5/14, extubated  5/16, transferred out to [facility]  Subjective: Patient was seen and examined this morning.  Pleasant elderly Caucasian male. Lying down in bed.  Has NG tube attached to suction. Slow to respond but alert, awake, oriented x 3.  Tearful talking about his situation Currently in postop ileus Chart reviewed. In the last 24 hours, afebrile, blood pressure in 140s and 150s. Lab showed Na 131, WBC count 9, hemoglobin 10.1  Assessment and plan: Mesenteric Vein Thrombosis --> Small bowel ischemia --> Bacterial translocation -- > Septic shock S/p surgical resection of bowel on 5/11, and further resection and closure on 5/12 Primary management per general surgery Has wound VAC in place Remains on IV Zosyn. No fever, WBC count normal. Required  vasopressors postop.  Weaned off. Hemodynamically improving now Pain control as needed fentanyl Recent Labs  Lab 04/12/24 2343 04/13/24 0137 04/13/24 0612 04/13/24 1120 04/14/24 0352 04/14/24 1416 04/15/24 0431 04/16/24 0530 04/17/24 0510 04/18/24 0529  WBC  --   --  11.4*  --    < > 9.2 12.2* 10.4 10.3 9.0  LATICACIDVEN 2.8* 4.9* 2.4* 2.1*  --   --   --   --   --   --    < > = values in this interval not displayed.   Postop ileus Remains on TPN. Noted a plan for general surgery to clamp NG tube today  continue PPI, IV Zofran as needed  Hypokalemia/hypophosphatemia Secondary to GI loss from NG tube drainage Levels improved with replacement. Recent Labs  Lab 04/13/24 0612 04/13/24 1608 04/14/24 0352 04/15/24 0431 04/16/24 0530 04/17/24 0510 04/18/24 0529  K 4.5   < > 4.5 4.2 3.4* 3.2* 3.7  MG 2.1  --   --  2.3 2.2 2.0 1.9  PHOS 4.9*  --   --  3.7 1.8* 2.3* 2.9   < > = values in this interval not displayed.   Hyponatremia Likely from low intake.  Continue to monitor. Recent Labs  Lab 04/12/24 2049 04/13/24 0612 04/13/24 1608 04/14/24 0352 04/15/24 0431 04/16/24 0530 04/17/24 0510 04/18/24 0529  NA 132* 132* 135 137 138 139 135 131*   Acute worsening of chronic systolic CHF Moderate to severe MR  Hypertension EF in 2023 with 35 to 40%.  Echo 5/11 showed EF of 30 to 35% with regional WMA  and moderately reduced RV function, moderate to severe MR, moderate TR. It seems, previously patient was not on GDMT.  Had no follow-up with cardiology In ICU, received IV Lasix intermittently. Currently blood pressure in 140s and 150s. Requested cardiology consultation  A-fib with RVR Currently rate controlled on amiodarone  drip and digoxin 0.125 mg IV daily Previously not on anticoagulation for last 2 years.  Now on IV heparin drip.  Acute blood loss anemia Hemoglobin trend as below.  Remains between 9 and 10.  Continue to monitor.  Transfuse if less than 7.    Recent Labs    04/14/24 1416 04/15/24 0431 04/16/24 0530 04/17/24 0510 04/18/24 0529  HGB 9.5* 9.3* 9.1* 9.9* 10.1*  MCV 78.9* 79.6* 75.4* 75.5* 75.4*    Elevated LFTs  likely component of shock liver Gradually improving Recent Labs  Lab 04/12/24 2049 04/13/24 0612 04/16/24 0530 04/17/24 0510  AST 125* 77* 69* 58*  ALT 195* 142* 214* 183*  ALKPHOS 87 65 57 62  BILITOT 1.6* 1.7* 1.1 1.6*  PROT 7.3 6.1* 5.6* 6.0*  ALBUMIN 3.7 3.3* 2.6* 2.6*   Hyperglycemia A1c 6.3.  No prior history of diabetes Currently on SSI/Accu-Cheks Recent Labs  Lab 04/17/24 1927 04/17/24 2305 04/18/24 0408 04/18/24 0742 04/18/24 1126  GLUCAP 130* 124* 138* 128* 122*   Hx of hypothyroidism tsh 4.6 Synthroid on hold, to resume once able to tolerate oral intake  Acute urinary retention  BPH Patient endorses lower urinary tract symptoms but states he was afraid to go to the urologist.  Currently he is having urinary retention and required In-N-Out catheterizing twice in last 24 hours.  I started him on Flomax, to be given while NG tube is clamped.  If continues to retain urine, may need Foley catheter   Mobility: PT encourage ambulation once clinically improved.  Goals of care   Code Status: Full Code     DVT prophylaxis:  SCDs Start: 04/13/24 0500   Antimicrobials: IV Zosyn Fluid: TPN Consultants: General Surgery, cardiology called Family Communication: None at bedside  Status: Inpatient Level of care:  ICU   Patient is from: Home Needs to continue in-hospital care: Postop ileus status Anticipated d/c to: Pending clinical course   Diet:  Diet Order             Diet NPO time specified  Diet effective now                   Scheduled Meds:  Chlorhexidine Gluconate Cloth  6 each Topical Q0600   digoxin  0.125 mg Intravenous Daily   insulin aspart  0-15 Units Subcutaneous Q4H   metoprolol  tartrate  2.5 mg Intravenous Q6H   pantoprazole (PROTONIX) IV  40 mg  Intravenous Q24H   pneumococcal 20-valent conjugate vaccine  0.5 mL Intramuscular Tomorrow-1000   scopolamine  1 patch Transdermal Q72H   sodium chloride flush  10-40 mL Intracatheter Q12H   tamsulosin  0.4 mg Oral Daily    PRN meds: fentaNYL (SUBLIMAZE) injection, hydrALAZINE, ondansetron (ZOFRAN) IV, sodium chloride flush   Infusions:   amiodarone  30 mg/hr (04/18/24 1126)   heparin 1,850 Units/hr (04/18/24 0838)   potassium chloride Stopped (04/18/24 1254)   TPN ADULT (ION) 60 mL/hr at 04/18/24 0838   TPN ADULT (ION)      Antimicrobials: Anti-infectives (From admission, onward)    Start     Dose/Rate Route Frequency Ordered Stop   04/14/24 1000  vancomycin (VANCOREADY) IVPB 1500 mg/300 mL  Status:  Discontinued  1,500 mg 150 mL/hr over 120 Minutes Intravenous Every 24 hours 04/13/24 0731 04/15/24 1416   04/14/24 0700  vancomycin (VANCOREADY) IVPB 1750 mg/350 mL  Status:  Discontinued        1,750 mg 175 mL/hr over 120 Minutes Intravenous Every 24 hours 04/13/24 0547 04/13/24 0731   04/13/24 0615  vancomycin (VANCOREADY) IVPB 1750 mg/350 mL        1,750 mg 175 mL/hr over 120 Minutes Intravenous  Once 04/13/24 0525 04/13/24 0811   04/13/24 0400  piperacillin-tazobactam (ZOSYN) IVPB 3.375 g  Status:  Discontinued        3.375 g 100 mL/hr over 30 Minutes Intravenous Every 6 hours 04/13/24 0240 04/13/24 0244   04/13/24 0400  piperacillin-tazobactam (ZOSYN) IVPB 3.375 g  Status:  Discontinued        3.375 g 12.5 mL/hr over 240 Minutes Intravenous Every 8 hours 04/13/24 0245 04/18/24 0936   04/12/24 2245  piperacillin-tazobactam (ZOSYN) IVPB 3.375 g        3.375 g 100 mL/hr over 30 Minutes Intravenous  Once 04/12/24 2237 04/12/24 2333       Objective: Vitals:   04/18/24 1200 04/18/24 1300  BP: (!) 145/89 (!) 131/93  Pulse:    Resp: 19 18  Temp: 98.2 F (36.8 C)   SpO2:      Intake/Output Summary (Last 24 hours) at 04/18/2024 1353 Last data filed at 04/18/2024  0838 Gross per 24 hour  Intake 2327.1 ml  Output 2750 ml  Net -422.9 ml   Filed Weights   04/15/24 0444 04/17/24 0702 04/18/24 0400  Weight: 95 kg 95.9 kg 88.8 kg   Weight change:  Body mass index is 28.91 kg/m.   Physical Exam: General exam: Pleasant, elderly Caucasian male Skin: No rashes, lesions or ulcers. HEENT: Atraumatic, normocephalic, no obvious bleeding Lungs: Clear to auscultation bilaterally,  CVS: S1, S2, faint pansystolic murmur GI/Abd: Soft, appropriate postop tenderness, nondistended, bowel sound very sluggish,   CNS: Slow to respond but alert, awake, oriented x 3 Psychiatry: Sad affect Extremities: No pedal edema, no calf tenderness  Data Review: I have personally reviewed the laboratory data and studies available.  F/u labs  Unresulted Labs (From admission, onward)     Start     Ordered   04/21/24 0500  Triglycerides  (TPN Lab Panel)  Every Monday (0500),   R     Question:  Specimen collection method  Answer:  Unit=Unit collect   04/15/24 1416   04/19/24 0500  Basic metabolic panel with GFR  Tomorrow morning,   R       Question:  Specimen collection method  Answer:  Unit=Unit collect   04/18/24 0831   04/19/24 0500  CBC with Differential/Platelet  Tomorrow morning,   R       Question:  Specimen collection method  Answer:  Unit=Unit collect   04/18/24 0831   04/19/24 0500  Lactic acid, plasma  (Lactic Acid)  Tomorrow morning,   R       Question:  Specimen collection method  Answer:  Unit=Unit collect   04/18/24 0831   04/19/24 0500  Magnesium  Tomorrow morning,   R       Question:  Specimen collection method  Answer:  Unit=Unit collect   04/18/24 1030   04/19/24 0500  Phosphorus  Tomorrow morning,   R       Question:  Specimen collection method  Answer:  Unit=Unit collect   04/18/24 1030   04/19/24 0500  Basic metabolic panel with GFR  Tomorrow morning,   R       Question:  Specimen collection method  Answer:  Unit=Unit collect   04/18/24 1030    04/17/24 0500  Comprehensive metabolic panel  (TPN Lab Panel)  Every Mon,Thu (0500),   R     Question:  Specimen collection method  Answer:  Unit=Unit collect   04/15/24 1416   04/17/24 0500  Magnesium  (TPN Lab Panel)  Every Mon,Thu (0500),   R     Question:  Specimen collection method  Answer:  Unit=Unit collect   04/15/24 1416   04/17/24 0500  Phosphorus  (TPN Lab Panel)  Every Mon,Thu (0500),   R     Question:  Specimen collection method  Answer:  Unit=Unit collect   04/15/24 1416   04/16/24 0500  Heparin level (unfractionated)  Daily,   R     Question:  Specimen collection method  Answer:  Unit=Unit collect   04/15/24 1910   04/14/24 0500  Creatinine, serum  Daily,   R      04/13/24 0545            Admission date and time: 04/12/2024  8:21 PM    Signed, Hoyt Macleod, MD [facility] 04/18/2024

## 2024-04-18 NOTE — Progress Notes (Signed)
  Inpatient Rehab Admissions Coordinator :  Per OT therapy recommendations patient was screened for CIR candidacy by Jeannetta Millman RN MSN. Patient is not at a level to tolerate the intensity required to pursue a CIR admit.. Other rehab venues should continue to be pursued at this time.  Please contact me with any questions.  Jeannetta Millman RN MSN Admissions Coordinator [phone]

## 2024-04-18 NOTE — Progress Notes (Signed)
 PHARMACY - ANTICOAGULATION CONSULT NOTE  Pharmacy Consult for Heparin Indication: atrial fibrillation, SMV thrombosis  Allergies  Allergen Reactions   Aspirin     81 mg - epistaxis/gums bleeding   Codeine     hallucinations    Patient Measurements: Height: 5\' 9"  (175.3 cm) Weight: 88.8 kg (195 lb 12.3 oz) IBW/kg (Calculated) : 70.7 HEPARIN DW (KG): 88.5  Vital Signs: Temp: 98.5 F (36.9 C) (05/16 0800) Temp Source: Oral (05/16 0800) BP: 150/102 (05/16 0900) Pulse Rate: 77 (05/16 0900)  Labs: Recent Labs    04/16/24 0530 04/17/24 0510 04/17/24 1415 04/17/24 2338 04/18/24 0529  HGB 9.1* 9.9*  --   --  10.1*  HCT 30.6* 32.6*  --   --  34.0*  PLT 160 165  --   --  169  HEPARINUNFRC 0.31 0.24* 0.22* 0.54 0.48  CREATININE 0.98 0.73  --   --  0.78    Estimated Creatinine Clearance (by C-G formula based on SCr of 0.78 mg/dL) Male: 27.2 mL/min Male: 101.4 mL/min   Medical History: Past Medical History:  Diagnosis Date   BPH (benign prostatic hyperplasia)    History of epistaxis    Hyperlipidemia    Muscle ache    Persistent atrial fibrillation (HCC)    Premature ventricular contraction    RBBB    RBBB    Thyroid disease    Typical atrial flutter (HCC)     Medications:  Infusions:   amiodarone  30 mg/hr (04/18/24 0838)   heparin 1,850 Units/hr (04/18/24 0838)   TPN ADULT (ION) 60 mL/hr at 04/18/24 0838    Assessment: 67 yoM admitted on 5/11 with abdominal pain and AFib with RVR.  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  PMH significant for AFib, but has been off medications/anticoagulation for past 2 years.  Pharmacy is consulted to dose heparin IV.  Significant events: - 5/12 Heparin held at 4am for surgery, resumed on 5/12 at 19:00  Today, 04/18/2024: HL 0.48 therapeutic on 1850 units/hr No bleeding or line issues  charted  Hgb 10.1 ,low but stable, plt 169    Goal of Therapy:  Heparin level 0.3-0.7 units/ml Monitor platelets by  anticoagulation protocol: Yes   Plan:  Continue heparin drip at 1850 units/hr Daily heparin level and CBC Monitor for signs and symptoms of bleeding     Van Gelinas, PharmD, BCPS 04/18/2024 10:16 AM

## 2024-04-18 NOTE — Progress Notes (Signed)
 PHARMACY - TOTAL PARENTERAL NUTRITION CONSULT NOTE   Indication: bowel obstruction  Patient Measurements: Height: 5\' 9"  (175.3 cm) Weight: 88.8 kg (195 lb 12.3 oz) IBW/kg (Calculated) : 70.7 TPN AdjBW (KG): 75.2 Body mass index is 28.91 kg/m. 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  S/p small bowel resection x2 with anastomosis and abdominal wall closure on 5/12.  Pharmacy is consulted to dose TPN.  Glucose / Insulin: No hx DM noted.  A1c 6.3.   - 5/10 Dexamethasone 10mg , 5/12 Dexamethasone 5mg  - CBGs < 180 (range 116-138) on mSSI (8 units/ 24 hrs) Electrolytes: Elytes WNL except:  Na+ down to 131,  - K+ improved to 3.7, Corr Ca is 8.9 Renal: SCr and BUN WNL Hepatic: LFTs and Tbili mildly elevated, trended down.  Tbili up to 1.6 - TG 45 (5/15) Intake / Output; MIVF:  - Intake: no mIVF.  Lasix IV x1 on 5/14 - Output: UOP , drains 0  mL, NG up to 550 mL (+nausea)  GI Imaging: GI Surgeries / Procedures:  - 5/11 OR:  lap > open for necrotic small bowel w/ 40cm small bowel resected and notable mesenteric vein thromboses. Left in discontinuity. - 5/12 OR: 2nd look, 40 cm small bowel resection, anastomosis, abdominal wall closure  Central access: CVC 5/11 TPN start date: 5/13  Nutritional Goals: Goal TPN rate is 85 mL/hr and provides 122 g of protein and 1871 kcals per day.   RD Assessment: Estimated Needs Total Energy Estimated Needs: 1800-2200 kcals Total Protein Estimated Needs: 110-125 grams Total Fluid Estimated Needs: >/= 1.8L  Current Nutrition:  NPO  Plan:  Now - Potassium chloride IV  10 meq IV x 3   Advance TPN to 85 mL/hr at 1800, the target rate  Electrolytes in TPN:  Na inc to 90 mEq/L K inc to 65 mEq/L Ca 8 mEq/L Mg 75mEq/L  Phos 20 mmol/L Cl:Ac 1:1 Add standard MVI and trace elements to TPN Continue moderate SSI and adjust as needed  No IVF per MD. Monitor TPN  labs on Mon/Thurs, recheck bmet, mag, phos with AM labs  BMP, magnesium, phosphorus with AM labs    Van Gelinas, PharmD, BCPS 04/18/2024 10:30 AM

## 2024-04-18 NOTE — Plan of Care (Signed)
  Problem: Education: Goal: Knowledge of General Education information will improve Description: Including pain rating scale, medication(s)/side effects and non-pharmacologic comfort measures Outcome: Progressing   Problem: Health Behavior/Discharge Planning: Goal: Ability to manage health-related needs will improve Outcome: Progressing   Problem: Clinical Measurements: Goal: Ability to maintain clinical measurements within normal limits will improve Outcome: Progressing Goal: Will remain free from infection Outcome: Progressing Goal: Diagnostic test results will improve Outcome: Progressing Goal: Respiratory complications will improve Outcome: Progressing Goal: Cardiovascular complication will be avoided Outcome: Progressing   Problem: Activity: Goal: Risk for activity intolerance will decrease Outcome: Progressing   Problem: Nutrition: Goal: Adequate nutrition will be maintained Outcome: Progressing   Problem: Elimination: Goal: Will not experience complications related to urinary retention Outcome: Progressing   Problem: Pain Managment: Goal: General experience of comfort will improve and/or be controlled Outcome: Progressing   Problem: Safety: Goal: Ability to remain free from injury will improve Outcome: Progressing   Problem: Skin Integrity: Goal: Risk for impaired skin integrity will decrease Outcome: Progressing   Problem: Education: Goal: Ability to describe self-care measures that may prevent or decrease complications (Diabetes Survival Skills Education) will improve Outcome: Progressing Goal: Individualized Educational Video(s) Outcome: Progressing   Problem: Coping: Goal: Ability to adjust to condition or change in health will improve Outcome: Progressing   Problem: Fluid Volume: Goal: Ability to maintain a balanced intake and output will improve Outcome: Progressing   Problem: Health Behavior/Discharge Planning: Goal: Ability to identify and  utilize available resources and services will improve Outcome: Progressing Goal: Ability to manage health-related needs will improve Outcome: Progressing   Problem: Metabolic: Goal: Ability to maintain appropriate glucose levels will improve Outcome: Progressing   Problem: Nutritional: Goal: Maintenance of adequate nutrition will improve Outcome: Progressing Goal: Progress toward achieving an optimal weight will improve Outcome: Progressing   Problem: Skin Integrity: Goal: Risk for impaired skin integrity will decrease Outcome: Progressing   Problem: Tissue Perfusion: Goal: Adequacy of tissue perfusion will improve Outcome: Progressing

## 2024-04-18 NOTE — Progress Notes (Signed)
 Nutrition Follow-up  DOCUMENTATION CODES:   Obesity unspecified  INTERVENTION:  - Plan to continue goal TPN.             - TPN management per pharmacy.    - Daily weights while on TPN.  - Will monitor for diet advancement.  NUTRITION DIAGNOSIS:   Inadequate oral intake related to inability to eat as evidenced by NPO status. *ongoing  GOAL:   Patient will meet greater than or equal to 90% of their needs *met with TPN  MONITOR:   Vent status, Labs, Weight trends  REASON FOR ASSESSMENT:   Ventilator    ASSESSMENT:   68 y.o. male with a PMH of HTN, thyroid disease, A-fib who presented with abdominal pain. Admitted for small bowel ischemia with septic shock.  5/11 Admit; taken to OR for bowel ischemia, s/p laparoscopic abdominal surgery converted to open for necrotic small bowel - 40cm small bowel resected and notable mesenteric vein thromboses; Left in discontinuity 5/12 Taken back to OR with CCS - ex-lap with another resection of ~40cm of ileum with primary anastomosis and closure of abdomen with wound VAC 5/13 Starting TPN; Extubated    Patient reports he would really like to have NGT removed today. Plan for NGT clamping trials today.  He is also hopeful for something to drink soon. He has tried ONS before and agreeable to receive once diet advanced.  TPN remains at goal of 5mL/hr, providing 1871 kcals and 122g protein.       Admit weight: 209# Current weight: 195# I&O's: - since admit   Medications reviewed and include: -   Labs reviewed:  Na 131 HA1C 6.3 Blood Glucose 116-138 x 24 hours Triglycerides 45 (as of 5/15)   Diet Order:   Diet Order             Diet NPO time specified  Diet effective now                   EDUCATION NEEDS:  Not appropriate for education at this time  Skin:  Skin Assessment: Skin Integrity Issues: Skin Integrity Issues:: Incisions Incisions: Abdomen  Last BM:  PTA  Height:  Ht Readings from Last 1  Encounters:  04/18/24 5\' 9"  (1.753 m)   Weight:  Wt Readings from Last 1 Encounters:  04/18/24 88.8 kg   Ideal Body Weight:  72.73 kg  BMI:  Body mass index is 28.91 kg/m.  Estimated Nutritional Needs:  Kcal:  1800-2200 kcals Protein:  110-125 grams Fluid:  >/= 1.8L    Scheryl Cushing RD, LDN Contact via Secure Chat.

## 2024-04-18 NOTE — Progress Notes (Incomplete)
 PHARMACY - ANTICOAGULATION CONSULT NOTE  Pharmacy Consult for Heparin Indication: atrial fibrillation, SMV thrombosis  Allergies  Allergen Reactions  . Aspirin     81 mg - epistaxis/gums bleeding  . Codeine     hallucinations    Patient Measurements: Height: 5\' 9"  (175.3 cm) Weight: 95.9 kg (211 lb 6.7 oz) IBW/kg (Calculated) : 70.7 HEPARIN DW (KG): 92.4  Vital Signs: Temp: 98 F (36.7 C) (05/15 2306) Temp Source: Oral (05/15 2306) BP: 134/82 (05/15 2300) Pulse Rate: 96 (05/15 2300)  Labs: Recent Labs    04/15/24 0431 04/15/24 1140 04/16/24 0530 04/17/24 0510 04/17/24 1415 04/17/24 2338  HGB 9.3*  --  9.1* 9.9*  --   --   HCT 32.7*  --  30.6* 32.6*  --   --   PLT 147*  --  160 165  --   --   HEPARINUNFRC 0.26*   < > 0.31 0.24* 0.22* 0.54  CREATININE 1.05  --  0.98 0.73  --   --    < > = values in this interval not displayed.    Estimated Creatinine Clearance (by C-G formula based on SCr of 0.73 mg/dL) Male: 09.8 mL/min Male: 105.2 mL/min   Medical History: Past Medical History:  Diagnosis Date  . BPH (benign prostatic hyperplasia)   . History of epistaxis   . Hyperlipidemia   . Muscle ache   . Persistent atrial fibrillation (HCC)   . Premature ventricular contraction   . RBBB   . RBBB   . Thyroid disease   . Typical atrial flutter (HCC)     Medications:  Infusions:  . amiodarone  30 mg/hr (04/17/24 2300)  . heparin 1,850 Units/hr (04/17/24 2300)  . piperacillin-tazobactam (ZOSYN)  IV 12.5 mL/hr at 04/17/24 2300  . TPN ADULT (ION) 60 mL/hr at 04/17/24 2300    Assessment: 67 yoM admitted on 5/11 with abdominal pain and AFib with RVR.  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  PMH significant for AFib, but has been off medications/anticoagulation for past 2 years.  Pharmacy is consulted to dose heparin IV.  Significant events: - 5/12 Heparin held at 4am for surgery, resumed on 5/12 at 19:00  Today, 04/18/2024: Heparin level 0.24,  decreased to sub-therapeutic on heparin 1450 units/hr CBC: Hgb low stable at 9.9, Plt 165 No bleeding or complications noted per RN   Goal of Therapy:  Heparin level 0.3-0.7 units/ml Monitor platelets by anticoagulation protocol: Yes   Plan:  Give heparin 1000 units bolus IV x 1 Increase to heparin IV infusion at 1650 units/hr Heparin level 6 hours after rate change Daily heparin level and CBC  Kendall Pauls PharmD, BCPS [facility] main [facility] [phone] 04/18/2024 12:02 AM     Addendum:  12:02 AM  Heparin level decreased to 0.22, subtherapeutic, despite previous bolus and rate increase. Heparin currently infusing at 1650 ml/hr. No bleeding or complication, IV issues or pauses per RN.   Plan: Give heparin 1500 units bolus IV x 1 Increase to heparin IV infusion at 1850 units/hr Heparin level 6 hours after rate change Daily heparin level and CBC  Kendall Pauls PharmD, BCPS [facility] [phone] 04/18/2024 12:02 AM

## 2024-04-18 NOTE — Evaluation (Signed)
 Occupational Therapy Evaluation Patient Details Name: Donald Morales MRN: [id number] DOB: [birth year] Today's Date: 04/18/2024   History of Present Illness   68 y/o gentleman  who presented with necrotic bowel due to venous obstruction.  On 5/11 he he required resection of necrotic bowel and was left in discontinuity.  On 5/12 he returned to the OR with a small residual area of necrotic tissue removed and was reconnected.  Wound VAC in place.  Transferred back to the ICU for mechanical ventilation. Now NPO with ng tube.  Pt with a history of A-fib not on anticoagulation     Clinical Impressions PTA, patient was living alone independently working and driving. Has supportive friends and neighbors with visitors present during OT eval. Patient up in recliner upon OT arrival using his cell phone to return voice mails. Currently, patient on RA (see VSR below) with wound vac in place and NG tube in addition to multiple lines and leads therefore mobility somewhat limited but progressing. Patient presents with deficits outlined below (see OT Problem List for details) most significantly decreased skin integrity, cognitive and swallowing deficits, generalized muscle weakness, decreased balance and activity tolerance impacting BADL's and functional mobility. Based on performance this session and PLOF, patient will benefit from intensive inpatient follow-up therapy, >3 hours/day. OT will continue to progress and follow acutely to allow for safe discharge and functional progression.       If plan is discharge home, recommend the following:   Two people to help with walking and/or transfers;Two people to help with bathing/dressing/bathroom;Assistance with cooking/housework;Assistance with feeding;Direct supervision/assist for medications management;Direct supervision/assist for financial management;Assist for transportation;Help with stairs or ramp for entrance;Supervision due to cognitive status      Functional Status Assessment   Patient has had a recent decline in their functional status and demonstrates the ability to make significant improvements in function in a reasonable and predictable amount of time.     Equipment Recommendations   Other (comment) (TBD post rehab)      Precautions/Restrictions   Precautions Precautions: Other (comment) (NG tube and R double lumen IJ line) Recall of Precautions/Restrictions: Impaired Precaution/Restrictions Comments: abdominal incision with VAC Restrictions Weight Bearing Restrictions Per Provider Order: No     Mobility Bed Mobility Overal bed mobility:  (patient already up in recliner and remained for session)                  Transfers Overall transfer level: Needs assistance Equipment used: Rolling walker (2 wheels) Transfers: Sit to/from Stand, Bed to chair/wheelchair/BSC Sit to Stand: +2 physical assistance, +2 safety/equipment, Mod assist     Step pivot transfers: Mod assist, +2 physical assistance, +2 safety/equipment     General transfer comment: cues for hand placement and +2 for LB weakness, steadying and lines management      Balance Overall balance assessment: Needs assistance Sitting-balance support: Feet supported Sitting balance-Leahy Scale: Fair     Standing balance support: Bilateral upper extremity supported, During functional activity, Reliant on assistive device for balance Standing balance-Leahy Scale: Poor                             ADL either performed or assessed with clinical judgement   ADL Overall ADL's : Needs assistance/impaired Eating/Feeding: NPO (NG tube currently)   Grooming: Wash/dry hands;Wash/dry face;Brushing hair;Contact guard assist;Sitting;Cueing for sequencing   Upper Body Bathing: Minimal assistance;Sitting;Cueing for sequencing;Cueing for safety   Lower Body Bathing: Maximal assistance;Sitting/lateral  leans;Sit to/from stand   Upper Body  Dressing : Maximal assistance;Sitting   Lower Body Dressing: Total assistance;+2 for physical assistance;+2 for safety/equipment;Sit to/from stand   Toilet Transfer: BSC/3in1;+2 for physical assistance;+2 for safety/equipment;Moderate assistance   Toileting- Clothing Manipulation and Hygiene: Total assistance       Functional mobility during ADLs: +2 for physical assistance;+2 for safety/equipment;Moderate assistance;Rolling walker (2 wheels) General ADL Comments: simple recliner and STS level BADL's assessed, patient able to figure 4 75% of way to reach socks Bly     Vision Baseline Vision/History: 0 No visual deficits Ability to See in Adequate Light: 0 Adequate Patient Visual Report: No change from baseline       Perception Perception: Not tested       Praxis Praxis: Impaired Praxis Impairment Details: Motor planning, Organization     Pertinent Vitals/Pain Pain Assessment Pain Assessment: Faces Faces Pain Scale: No hurt     Extremity/Trunk Assessment Upper Extremity Assessment Upper Extremity Assessment: Right hand dominant;Generalized weakness (L IJ line limited full L UE ROM but appears WFL)   Lower Extremity Assessment Lower Extremity Assessment: Defer to PT evaluation;Generalized weakness   Cervical / Trunk Assessment Cervical / Trunk Assessment: Normal   Communication Communication Communication: No apparent difficulties   Cognition Arousal: Alert Behavior During Therapy: WFL for tasks assessed/performed Cognition: Cognition impaired   Orientation impairments: Time Awareness: Intellectual awareness impaired, Online awareness impaired Memory impairment (select all impairments): Short-term memory Attention impairment (select first level of impairment): Sustained attention Executive functioning impairment (select all impairments): Problem solving, Organization, Sequencing, Reasoning OT - Cognition Comments: patient up in recliner checking his voicemails and  responding via text with increased time and problem solving cues, patient with slower processing and intermittently tangential                 Following commands: Impaired Following commands impaired: Follows one step commands with increased time     Cueing  General Comments   Cueing Techniques: Verbal cues;Tactile cues  HR 92-102 bpm and RR 20-26 with light activity on RA, BP pre 136/88 post 139/91   Exercises     Shoulder Instructions      Home Living Family/patient expects to be discharged to:: Inpatient rehab                                 Additional Comments: reports he lives alone, does not have family assistance available, was independent with mobility. Home has 3 STE, flight of stairs to bedroom, has 2 dogs and a cat, friends and neighbors who are close and 1 present for OT eval      Prior Functioning/Environment Prior Level of Function : Independent/Modified Independent;Driving;Working/employed             Mobility Comments: was independent with mobility ADLs Comments: indep with all aspects of A/IADL's including working, pet care and driving    OT Problem List: Decreased strength;Decreased activity tolerance;Impaired balance (sitting and/or standing);Decreased coordination;Decreased cognition;Decreased safety awareness;Decreased knowledge of use of DME or AE;Decreased knowledge of precautions;Cardiopulmonary status limiting activity   OT Treatment/Interventions: Self-care/ADL training;Therapeutic exercise;Neuromuscular education;Energy conservation;DME and/or AE instruction;Therapeutic activities;Cognitive remediation/compensation;Patient/family education;Balance training      OT Goals(Current goals can be found in the care plan section)   Acute Rehab OT Goals Patient Stated Goal: to get back home OT Goal Formulation: With patient Time For Goal Achievement: 05/02/24 Potential to Achieve Goals: Good ADL Goals Pt Will Perform  Grooming:  with supervision;sitting Pt Will Perform Lower Body Bathing: with contact guard assist;sitting/lateral leans;sit to/from stand;with adaptive equipment Pt Will Perform Lower Body Dressing: with contact guard assist;with adaptive equipment;sitting/lateral leans;sit to/from stand Pt Will Transfer to Toilet: with min assist;ambulating;bedside commode;grab bars;regular height toilet Pt Will Perform Toileting - Clothing Manipulation and hygiene: with contact guard assist;sitting/lateral leans;sit to/from stand Pt Will Perform Tub/Shower Transfer: with contact guard assist;shower seat;rolling walker Pt/caregiver will Perform Home Exercise Program: Increased strength;Both right and left upper extremity;Independently;With written HEP provided Additional ADL Goal #1: patient to demonstrate good safety awareness by answering 4/4 safety questions from the [facility] correctly: 1. What do you do for yourself if you are sick with a cold? 2. What do you do if you burn yourself and the wound becomes infected? 3. What do you do if you experience severe chest pain and shortness of breath? 4. What number do you call in an emergency? In order to determine best discharge plan   OT Frequency:  Min 2X/week    Co-evaluation              AM-PAC OT "6 Clicks" Daily Activity     Outcome Measure Help from another person eating meals?: Total Help from another person taking care of personal grooming?: A Little Help from another person toileting, which includes using toliet, bedpan, or urinal?: Total Help from another person bathing (including washing, rinsing, drying)?: A Lot Help from another person to put on and taking off regular upper body clothing?: A Lot Help from another person to put on and taking off regular lower body clothing?: Total 6 Click Score: 10   End of Session Equipment Utilized During Treatment: Gait belt;Rolling walker (2 wheels) Nurse Communication: Mobility status  Activity Tolerance: Patient  limited by fatigue Patient left: in chair;with call bell/phone within reach;with chair alarm set;with family/visitor present  OT Visit Diagnosis: Unsteadiness on feet (R26.81);Muscle weakness (generalized) (M62.81);Feeding difficulties (R63.3);Cognitive communication deficit (R41.841)                Time: 6213-0865 OT Time Calculation (min): 30 min Charges:  OT General Charges $OT Visit: 1 Visit OT Evaluation $OT Eval Moderate Complexity: 1 Mod OT Treatments $Self Care/Home Management : 8-22 mins  Kareena Arrambide OT/L Acute Rehabilitation Department  [phone]  04/18/2024, 4:26 PM

## 2024-04-18 NOTE — Progress Notes (Signed)
 Progress Note  4 Days Post-Op  Subjective: Pt reports passing flatus once overnight. Foley removed. He was not able to get out of bed yesterday.   Objective: Vital signs in last 24 hours: Temp:  [98 F (36.7 C)-98.7 F (37.1 C)] 98.5 F (36.9 C) (05/16 0800) Pulse Rate:  [77-112] 77 (05/16 0900) Resp:  [12-21] 15 (05/16 0900) BP: (116-176)/(72-120) 150/102 (05/16 0900) SpO2:  [96 %-98 %] 98 % (05/16 0900) Weight:  [88.8 kg] 88.8 kg (05/16 0400) Last BM Date :  (PTA)  Intake/Output from previous day: 05/15 0701 - 05/16 0700 In: 2460.3 [I.V.:1993.3; IV Piggyback:467.1] Out: 2050 [Urine:1500; Emesis/NG output:550] Intake/Output this shift: Total I/O In: 278.9 [I.V.:246; IV Piggyback:32.9] Out: 700 [Urine:700]  PE: General: pleasant, WD, WN male who is laying in bed in NAD Heart: irregularly irregular Lungs: Respiratory effort nonlabored Abd: soft, appropriately ttp, midline wound with VAC present, NGT with bilious fluid Psych: A&Ox3 with an appropriate affect.    Lab Results:  Recent Labs    04/17/24 0510 04/18/24 0529  WBC 10.3 9.0  HGB 9.9* 10.1*  HCT 32.6* 34.0*  PLT 165 169   BMET Recent Labs    04/17/24 0510 04/18/24 0529  NA 135 131*  K 3.2* 3.7  CL 103 102  CO2 27 23  GLUCOSE 117* 128*  BUN 21 18  CREATININE 0.73 0.78  CALCIUM 7.7* 7.8*   PT/INR No results for input(s): "LABPROT", "INR" in the last 72 hours. CMP     Component Value Date/Time   NA 131 (L) 04/18/2024 0529   K 3.7 04/18/2024 0529   CL 102 04/18/2024 0529   CO2 23 04/18/2024 0529   GLUCOSE 128 (H) 04/18/2024 0529   BUN 18 04/18/2024 0529   CREATININE 0.78 04/18/2024 0529   CALCIUM 7.8 (L) 04/18/2024 0529   PROT 6.0 (L) 04/17/2024 0510   ALBUMIN 2.6 (L) 04/17/2024 0510   AST 58 (H) 04/17/2024 0510   ALT 183 (H) 04/17/2024 0510   ALKPHOS 62 04/17/2024 0510   BILITOT 1.6 (H) 04/17/2024 0510   GFRNONAA >60 04/18/2024 0529   Lipase     Component Value Date/Time    LIPASE 52 (H) 04/12/2024 2049       Studies/Results: DG Abd 1 View Result Date: 04/16/2024 CLINICAL DATA:  NG tube placement EXAM: ABDOMEN - 1 VIEW limited portable semi upright view for tube placement COMPARISON:  X-ray 04/13/2024 FINDINGS: Limited x-ray for tube placement has tip extending overlying the right upper quadrant of the abdomen, likely distal stomach or proximal duodenum. Elsewhere there is some gas in nondilated loops of colon. Minimal small bowel gas. Overlapping cardiac leads. Note is made of cardiomegaly. IMPRESSION: Limited x-ray for tube placement has tip overlying the distal stomach or proximal duodenum in the right upper abdomen. Electronically Signed   By: Adrianna Horde M.D.   On: 04/16/2024 13:48    Anti-infectives: Anti-infectives (From admission, onward)    Start     Dose/Rate Route Frequency Ordered Stop   04/14/24 1000  vancomycin (VANCOREADY) IVPB 1500 mg/300 mL  Status:  Discontinued        1,500 mg 150 mL/hr over 120 Minutes Intravenous Every 24 hours 04/13/24 0731 04/15/24 1416   04/14/24 0700  vancomycin (VANCOREADY) IVPB 1750 mg/350 mL  Status:  Discontinued        1,750 mg 175 mL/hr over 120 Minutes Intravenous Every 24 hours 04/13/24 0547 04/13/24 0731   04/13/24 0615  vancomycin (VANCOREADY) IVPB 1750 mg/350 mL  1,750 mg 175 mL/hr over 120 Minutes Intravenous  Once 04/13/24 0525 04/13/24 0811   04/13/24 0400  piperacillin-tazobactam (ZOSYN) IVPB 3.375 g  Status:  Discontinued        3.375 g 100 mL/hr over 30 Minutes Intravenous Every 6 hours 04/13/24 0240 04/13/24 0244   04/13/24 0400  piperacillin-tazobactam (ZOSYN) IVPB 3.375 g        3.375 g 12.5 mL/hr over 240 Minutes Intravenous Every 8 hours 04/13/24 0245 04/18/24 2359   04/12/24 2245  piperacillin-tazobactam (ZOSYN) IVPB 3.375 g        3.375 g 100 mL/hr over 30 Minutes Intravenous  Once 04/12/24 2237 04/12/24 2333        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 Ramiro Burly POD4 ex-lap, resection 40 cm ileum with primary anastomosis and closure of abdomen Dr. Afton Horse - clamping trials today, continue TPN - mobilize as tolerated  - VAC changes M-R - ok to DC abx after today  FEN: NPO, TPN, NGT clamping trials VTE: hep gtt ID: [id number] 5/10>5/16  - per CCM -  HFrEF HTN Thyroid disease A. Fib not on anticoagulation  LOS: 5 days     [name], [facility] Surgery 04/18/2024, 9:34 AM Please see Amion for pager number during day hours 7:00am-4:30pm

## 2024-04-19 DIAGNOSIS — I5023 Acute on chronic systolic (congestive) heart failure: Secondary | ICD-10-CM

## 2024-04-19 DIAGNOSIS — K559 Vascular disorder of intestine, unspecified: Secondary | ICD-10-CM | POA: Diagnosis not present

## 2024-04-19 DIAGNOSIS — I4819 Other persistent atrial fibrillation: Secondary | ICD-10-CM | POA: Diagnosis not present

## 2024-04-19 LAB — CBC WITH DIFFERENTIAL/PLATELET
Abs Immature Granulocytes: 0.1 10*3/uL — ABNORMAL HIGH (ref 0.00–0.07)
Basophils Absolute: 0 10*3/uL (ref 0.0–0.1)
Basophils Relative: 0 %
Eosinophils Absolute: 0.2 10*3/uL (ref 0.0–0.5)
Eosinophils Relative: 2 %
HCT: 32.3 % — ABNORMAL LOW (ref 39.0–52.0)
Hemoglobin: 9.5 g/dL — ABNORMAL LOW (ref 13.0–17.0)
Immature Granulocytes: 1 %
Lymphocytes Relative: 11 %
Lymphs Abs: 1.3 10*3/uL (ref 0.7–4.0)
MCH: 22.2 pg — ABNORMAL LOW (ref 26.0–34.0)
MCHC: 29.4 g/dL — ABNORMAL LOW (ref 30.0–36.0)
MCV: 75.5 fL — ABNORMAL LOW (ref 80.0–100.0)
Monocytes Absolute: 1.7 10*3/uL — ABNORMAL HIGH (ref 0.1–1.0)
Monocytes Relative: 15 %
Neutro Abs: 8.2 10*3/uL — ABNORMAL HIGH (ref 1.7–7.7)
Neutrophils Relative %: 71 %
Platelets: 178 10*3/uL (ref 150–400)
RBC: 4.28 MIL/uL (ref 4.22–5.81)
RDW: 15.8 % — ABNORMAL HIGH (ref 11.5–15.5)
WBC: 11.4 10*3/uL — ABNORMAL HIGH (ref 4.0–10.5)
nRBC: 0.8 % — ABNORMAL HIGH (ref 0.0–0.2)

## 2024-04-19 LAB — BASIC METABOLIC PANEL WITH GFR
Anion gap: 7 (ref 5–15)
BUN: 21 mg/dL (ref 8–23)
CO2: 22 mmol/L (ref 22–32)
Calcium: 8.2 mg/dL — ABNORMAL LOW (ref 8.9–10.3)
Chloride: 104 mmol/L (ref 98–111)
Creatinine, Ser: 0.7 mg/dL (ref 0.61–1.24)
GFR, Estimated: >60 mL/min (ref >60)
Glucose, Bld: 106 mg/dL — ABNORMAL HIGH (ref 70–99)
Potassium: 3.9 mmol/L (ref 3.5–5.1)
Sodium: 133 mmol/L — ABNORMAL LOW (ref 135–145)

## 2024-04-19 LAB — GLUCOSE, CAPILLARY
Glucose-Capillary: 127 mg/dL — ABNORMAL HIGH (ref 70–99)
Glucose-Capillary: 136 mg/dL — ABNORMAL HIGH (ref 70–99)
Glucose-Capillary: 139 mg/dL — ABNORMAL HIGH (ref 70–99)
Glucose-Capillary: 158 mg/dL — ABNORMAL HIGH (ref 70–99)
Glucose-Capillary: 160 mg/dL — ABNORMAL HIGH (ref 70–99)
Glucose-Capillary: 164 mg/dL — ABNORMAL HIGH (ref 70–99)

## 2024-04-19 LAB — PHOSPHORUS: Phosphorus: 2.9 mg/dL (ref 2.5–4.6)

## 2024-04-19 LAB — LACTIC ACID, PLASMA: Lactic Acid, Venous: 1 mmol/L (ref 0.5–1.9)

## 2024-04-19 LAB — MAGNESIUM: Magnesium: 1.9 mg/dL (ref 1.7–2.4)

## 2024-04-19 LAB — HEPARIN LEVEL (UNFRACTIONATED): Heparin Unfractionated: 0.57 [IU]/mL (ref 0.30–0.70)

## 2024-04-19 MED ORDER — POTASSIUM CHLORIDE 10 MEQ/50ML IV SOLN
10.0000 meq | INTRAVENOUS | Status: AC
  Administered 2024-04-19 (×2): 10 meq via INTRAVENOUS
  Filled 2024-04-19 (×2): qty 50

## 2024-04-19 MED ORDER — FUROSEMIDE 10 MG/ML IJ SOLN
40.0000 mg | Freq: Two times a day (BID) | INTRAMUSCULAR | Status: DC
  Administered 2024-04-19: 40 mg via INTRAVENOUS
  Filled 2024-04-19: qty 4

## 2024-04-19 MED ORDER — TRAVASOL 10 % IV SOLN
INTRAVENOUS | Status: AC
  Filled 2024-04-19: qty 1224

## 2024-04-19 NOTE — Consult Note (Addendum)
 CARDIOLOGY CONSULT NOTE    Patient ID: [name] MRN: [id number], DOB/AGE: [birth year] 68 y.o.  Admit date: 04/12/2024 Date of Consult: 04/19/2024  Primary Physician: Rae Bugler, MD Primary Cardiologist: not established Electrophysiologist: Dr. Arlester Ladd  Patient Profile: Donald Morales is a 68 y.o. adult with a history of persistent atrial fibrillation and flutter, history of tachycardia induced cardiomyopathy who is being seen today for the evaluation of acute CHF and atrial fibrillation at the request of Dr. Gwynneth Lessen.  HPI:  Donald Morales is a 68 y.o. adult with a history of persistent AF and CHFrEF, noncompliance, admitted for SMA thrombosis and sepsis.  He is known to [facility] electrophysiology.  I saw him approximately 2 years ago.  He had seen Dr. Nunzio Belch in the past for atrial fibrillation and flutter.  He has had a history of cardiomyopathy which was attributed to tachycardia.  Follow-up echocardiogram was recommended to assess improvement, but the patient declined.  Additionally, he was advised to have a cardioversion in the past as well as a sleep study for management of his atrial arrhythmia, but he declined these as well.  He had discontinued anticoagulation in the setting of anemia, which was under evaluation at the time of our visit in 2023.  He was admitted with severe abdominal pain and found to have SMV thrombosis and small bowel necrosis resulting in septic shock.  He underwent an initial small bowel resection on May 10-11 and a second look procedure on May 12 with additional resection.  He improved subsequently with resolution of shock and is now off pressors with normalization of white count.  Postoperatively, the patient had A-fib with RVR that was treated with amiodarone .  Echocardiogram May 11 showed an EF of 30 to 35% with regional wall motion abnormalities, moderate to severe MR, moderate TR.  He denies chest pain, palpitations, dyspnea, PND, orthopnea, nausea,  vomiting, dizziness, syncope, edema, or weight gain.  Past Medical History:  Diagnosis Date   BPH (benign prostatic hyperplasia)    History of epistaxis    Hyperlipidemia    Muscle ache    Persistent atrial fibrillation (HCC)    Premature ventricular contraction    RBBB    RBBB    Thyroid disease    Typical atrial flutter (HCC)       Home medications Medications Prior to Admission  Medication Sig Dispense Refill Last Dose/Taking   albuterol (VENTOLIN HFA) 108 (90 Base) MCG/ACT inhaler Inhale 2 puffs into the lungs every 6 (six) hours as needed for wheezing or shortness of breath.   Past Week   doxycycline (VIBRAMYCIN) 100 MG capsule Take 1 capsule by mouth 2 (two) times daily.   Past Week   levothyroxine  (SYNTHROID ) 50 MCG tablet Take 50 mcg by mouth daily before breakfast.   Past Week   predniSONE (DELTASONE) 50 MG tablet Take 50 mg by mouth daily.   04/12/2024   amiodarone  (PACERONE ) 200 MG tablet Take 1 tablet (200 mg total) by mouth 2 (two) times daily. Please call [phone] to schedule an overdue appointment for future refills. Thank you. 1st attempt. (Patient not taking: Reported on 10/17/2022) 60 tablet 0 Not Taking   apixaban  (ELIQUIS ) 5 MG TABS tablet Take 1 tablet (5 mg total) by mouth 2 (two) times daily. (Patient not taking: Reported on 04/13/2024) 60 tablet 11 Not Taking   diltiazem  (CARDIZEM  CD) 240 MG 24 hr capsule Take 1 capsule (240 mg total) by mouth in the morning and at bedtime. 180 capsule  3       Physical Exam: Vitals:   04/19/24 0428 04/19/24 0500 04/19/24 0600 04/19/24 0700  BP:  (!) 160/101 (!) 153/104 (!) 196/103  Pulse:   90 88  Resp:  18 (!) 23 (!) 22  Temp:      TempSrc:      SpO2:   100% 99%  Weight: 88.3 kg     Height:        Gen: Appears comfortable, well-nourished CV: tachy, irregular, Soft S1S2, trace dependent edema Pulm: breathing easily; basilar rales Abdomen: wound vac in place. Appears slightly distended. I did not hear bowel  sounds.  PERTINENT STUDIES SUMMARIZED:  Echocardiogram:      TTE Apr 13, 2024: EF 30 to 35%.  Moderate to severe mitral regurgitation.  RV function is moderately reduced.   EKG:   Atrial fibrillation with RVR, RBBB (personally reviewed)  TELEMETRY:    Af with controlled rates currently (personally reviewed)   ASSESSMENT & PLAN:  Acute on chronic CHFrEF Appears reasonably compensated at present Would diurese with IV Lasix  40 BID Add metoprolol  25mg  BID - plan to switch to XL or carvedilol on discharge Add valsartan with plans to switch to Entresto on discharge Would not resume diltiazem  on discharge With wall motion abnormalities present on echo, would prefer for outpatient coronary evaluation Will need to add spironolactone and jardiance  Atrial fibrillation Persistent I would not pursue rhythm control presently since the patient has not been anticoagulated -- DC amiodarone  Start metoprolol  PO 25 mg BID - can titrate up q8 hours to achieve effect Continue IV heparin . Start DOAC when appropriate from operative standpoint  Severe MR Diuresis Reevaluate as outpatient  SMA thrombosis AF with cardioembolism is a possible source He declined anticoagulation in the past Management per primary and surgical teams  Hypertension Will add on GDMT Add valsartan 40mg  daily Add metoprolol  25 BID   Suspected CAD - with wall motion abnormalities on TTE Would add rosuvastatin 20   For questions or updates, please contact [facility] Please consult [email or web address] for contact info under Cardiology/STEMI.  Signed, Marlane Silver, MD 04/19/2024 8:08 AM

## 2024-04-19 NOTE — Progress Notes (Signed)
 NG Tube removal per surgical order. Post removal patient began vomiting large amount of green bile all over bed. Patient cleaned up and provided emesis bag. Patient stated he did not feel nauseous anymore.  Surgery was made aware.   Lelon Putty

## 2024-04-19 NOTE — Progress Notes (Signed)
 Physical Therapy Treatment Patient Details Name: Donald Morales MRN: [id number] DOB: Dec 09, 1955 Today's Date: 04/19/2024   History of Present Illness 68 y/o gentleman  who presented with necrotic bowel due to venous obstruction.  On 5/11 he he required resection of necrotic bowel and was left in discontinuity.  On 5/12 he returned to the OR with a small residual area of necrotic tissue removed and was reconnected.  Wound VAC in place.  Transferred back to the ICU for mechanical ventilation. Now NPO with ng tube.  Pt with a history of A-fib not on anticoagulation    PT Comments  Instructed pt in log roll technique for bed mobility. Min assist for rolling and for sidelying to sit. Min assist to stand and take several pivotal steps to recliner with RW, +2 assist for line management. Pt performed seated UE/LE exercises. Pt tolerated tx well, activity limited by fatigue.     If plan is discharge home, recommend the following: A lot of help with walking and/or transfers;A lot of help with bathing/dressing/bathroom;Assistance with cooking/housework;Assist for transportation;Help with stairs or ramp for entrance   Can travel by private vehicle     No  Equipment Recommendations  Rolling walker (2 wheels)    Recommendations for Other Services       Precautions / Restrictions Precautions Precautions: Other (comment) Recall of Precautions/Restrictions: Impaired Precaution/Restrictions Comments: abdominal incision with VAC; reviewed log roll Restrictions Weight Bearing Restrictions Per Provider Order: No     Mobility  Bed Mobility   Bed Mobility: Rolling, Sidelying to Sit Rolling: Min assist Sidelying to sit: Min assist       General bed mobility comments: VCs log roll technique    Transfers Overall transfer level: Needs assistance Equipment used: Rolling walker (2 wheels) Transfers: Bed to chair/wheelchair/BSC, Sit to/from Stand Sit to Stand: +2 safety/equipment, Min assist    Step pivot transfers: Min assist, +2 safety/equipment       General transfer comment: cues for hand placement and +2 for lines management, min A to power up    Ambulation/Gait                   Stairs             Wheelchair Mobility     Tilt Bed    Modified Rankin (Stroke Patients Only)       Balance Overall balance assessment: Needs assistance Sitting-balance support: Feet supported, No upper extremity supported Sitting balance-Leahy Scale: Fair     Standing balance support: Bilateral upper extremity supported, During functional activity, Reliant on assistive device for balance Standing balance-Leahy Scale: Poor                              Communication Communication Communication: No apparent difficulties  Cognition Arousal: Alert Behavior During Therapy: WFL for tasks assessed/performed                             Following commands: Impaired Following commands impaired: Follows one step commands with increased time    Cueing Cueing Techniques: Verbal cues, Tactile cues  Exercises General Exercises - Upper Extremity Shoulder Flexion: AROM, Both, 10 reps, Seated General Exercises - Lower Extremity Long Arc Quad: AROM, Both, 10 reps, Seated    General Comments        Pertinent Vitals/Pain Pain Assessment Pain Assessment: 0-10 Pain Score: 5  Pain Location: abdomen Pain  Descriptors / Indicators: Sore Pain Intervention(s): Limited activity within patient's tolerance, Monitored during session, Repositioned, Premedicated before session    Home Living                          Prior Function            PT Goals (current goals can now be found in the care plan section) Acute Rehab PT Goals Patient Stated Goal: likes to mow the lawn, get home to 2 dogs [place] PT Goal Formulation: With patient Time For Goal Achievement: 04/30/24 Potential to Achieve Goals: Good Progress towards PT goals:  Progressing toward goals    Frequency    Min 3X/week      PT Plan      Co-evaluation              AM-PAC PT "6 Clicks" Mobility   Outcome Measure  Help needed turning from your back to your side while in a flat bed without using bedrails?: A Little Help needed moving from lying on your back to sitting on the side of a flat bed without using bedrails?: A Lot Help needed moving to and from a bed to a chair (including a wheelchair)?: A Little Help needed standing up from a chair using your arms (e.g., wheelchair or bedside chair)?: A Little Help needed to walk in hospital room?: Total Help needed climbing 3-5 steps with a railing? : Total 6 Click Score: 13    End of Session Equipment Utilized During Treatment: Gait belt Activity Tolerance: Patient limited by fatigue Patient left: in chair;with chair alarm set;with call bell/phone within reach;with family/visitor present Nurse Communication: Mobility status PT Visit Diagnosis: Difficulty in walking, not elsewhere classified (R26.2);Pain;Other abnormalities of gait and mobility (R26.89)     Time: 8413-2440 PT Time Calculation (min) (ACUTE ONLY): 17 min  Charges:    $Therapeutic Activity: 8-22 mins PT General Charges $$ ACUTE PT VISIT: 1 Visit                     Donald Morales PT 04/19/2024  Acute Rehabilitation Services  Office [phone]

## 2024-04-19 NOTE — Progress Notes (Signed)
 PROGRESS NOTE  Donald Morales  DOB: July 16, 1956  PCP: Rae Bugler, MD ZOX:[id number]  DOA: 04/12/2024  LOS: 6 days  Hospital Day: 8  Brief narrative: Donald Morales is a 68 y.o. adult with PMH significant for HTN, HLD, atrial flutter not on anticoagulation, hypothyroidism, BPH. 5/11, patient presented to the ED with progressive abdominal pain for few weeks worse on last few days.  Last BM was 5 days prior to presentation.  CT abdomen showed bowel wall thickening and also raised concern for SMV thrombosis.  Labs showed WC count of 12.1 and lactic acid elevated 2.8 and trended up to 4.9  Patient was seen by general surgery and taken to the OR emergently. Underwent diagnostic laparoscopy, converted to open.  Underwent resection of 40 cm of necrotic small bowel.  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 Left in discontinuity to return to OR in 24-48hrs.  Patient was admitted to ICU postop.  Required 20 phenylephrine  drip. 5/12, taken to OR for further bowel resection and closure of abdomen.  Postop, patient had Afib with RVR required amiodarone .  Eventually weaned off pressors, started on TPN,anticoagulation with IV heparin  drip.  5/14, extubated  5/16, transferred out to [facility]  Subjective: Patient was seen and examined this morning.  Pleasant elderly Caucasian male. Continues to have NG tube attached to suction. Has not passed flatus or had any bowel function yet. Heart rate in 80s and 90s in A-fib. 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 No fever, WBC count stable. Required vasopressors postop.  Weaned off. Hemodynamically improving now Pain control as needed  fentanyl  Recent Labs  Lab 04/12/24 2343 04/13/24 0137 04/13/24 0612 04/13/24 1120 04/14/24 0352 04/15/24 0431 04/16/24 0530 04/17/24 0510 04/18/24 0529 04/19/24 0555  WBC  --   --  11.4*  --    < > 12.2* 10.4 10.3 9.0 11.4*  LATICACIDVEN 2.8* 4.9* 2.4* 2.1*  --   --   --   --   --  1.0   < > = values in this interval not displayed.   Postop ileus Has not passed flatus.  No return of bowel function yet. Remains on TPN. Noted a plan for general surgery to retry NG tube clamping again today  continue PPI, IV Zofran  as needed  Hypokalemia/hypophosphatemia Secondary to GI loss from NG tube drainage Levels improved with replacement. Recent Labs  Lab 04/15/24 0431 04/16/24 0530 04/17/24 0510 04/18/24 0529 04/19/24 0555  K 4.2 3.4* 3.2* 3.7 3.9  MG 2.3 2.2 2.0 1.9 1.9  PHOS 3.7 1.8* 2.3* 2.9 2.9   Hyponatremia Likely from low intake.  Continue to monitor. Recent Labs  Lab 04/12/24 2049 04/13/24 0612 04/13/24 1608 04/14/24 0352 04/15/24 0431 04/16/24 0530 04/17/24 0510 04/18/24 0529 04/19/24 0555  NA 132* 132* 135 137 138 139 135 131* 133*   Acute worsening of chronic systolic CHF Moderate to severe MR  Hypertension EF in 2023 with 35 to 40%.  Echo 5/11 showed EF of 30 to 35% with regional WMA and moderately reduced RV function, moderate to severe MR, moderate TR. Seen by cardiology this morning.  Patient needs GDMT initiated once oral intake improves. IV Lasix  40 mg twice daily for now.  A-fib with RVR  Has history of A-fib and was seen by EP in the past but did not follow-up.  Qualified for anticoagulation but did not comply with it. With RVR this hospitalization, patient was started on amiodarone  drip, digoxin  and heparin  drip. Cardiology consult this morning recommended to switch to oral metoprolol , valsartan.  I discussed with cardiologist Dr. Arlester Ladd later this morning.  Patient does not have reliable oral intake and hence will continue amiodarone  drip for  now.    Acute blood loss anemia Hemoglobin trend as below.  Remains between 9 and 10.  Continue to monitor.  Transfuse if less than 7.   Recent Labs    04/15/24 0431 04/16/24 0530 04/17/24 0510 04/18/24 0529 04/19/24 0555  HGB 9.3* 9.1* 9.9* 10.1* 9.5*  MCV 79.6* 75.4* 75.5* 75.4* 75.5*    Elevated LFTs  likely component of shock liver Gradually improving Recent Labs  Lab 04/12/24 2049 04/13/24 0612 04/16/24 0530 04/17/24 0510  AST 125* 77* 69* 58*  ALT 195* 142* 214* 183*  ALKPHOS 87 65 57 62  BILITOT 1.6* 1.7* 1.1 1.6*  PROT 7.3 6.1* 5.6* 6.0*  ALBUMIN  3.7 3.3* 2.6* 2.6*   Hyperglycemia A1c 6.3.  No prior history of diabetes Currently on SSI/Accu-Cheks Recent Labs  Lab 04/18/24 1532 04/18/24 2011 04/18/24 2332 04/19/24 0349 04/19/24 0804  GLUCAP 115* 116* 134* 127* 136*   Hx of hypothyroidism tsh 4.6 Synthroid  on hold, to resume once able to tolerate oral intake IV Synthroid  currently.  Acute urinary retention  BPH Patient endorses lower urinary tract symptoms but states he was afraid to go to the urologist.   5/16, started on Flomax .  Able to void.  Continue to monitor.   Mobility: PT encourage ambulation once clinically improved.  Goals of care   Code Status: Full Code     DVT prophylaxis:  SCDs Start: 04/13/24 0500   Antimicrobials: IV Zosyn  Fluid: TPN Consultants: General Surgery, cardiology  Family Communication: None at bedside  Status: Inpatient Level of care:  ICU   Patient is from: Home Needs to continue in-hospital care: Postop ileus status Anticipated d/c to: Pending clinical course   Diet:  Diet Order             Diet clear liquid Room service appropriate? Yes; Fluid consistency: Thin  Diet effective now                   Scheduled Meds:  Chlorhexidine  Gluconate Cloth  6 each Topical Q0600   furosemide   40 mg Intravenous BID   insulin  aspart  0-15 Units Subcutaneous Q4H   [START ON 04/25/2024] levothyroxine   25  mcg Intravenous Daily   metoprolol  tartrate  2.5 mg Intravenous Q6H   pantoprazole  (PROTONIX ) IV  40 mg Intravenous Q24H   pneumococcal 20-valent conjugate vaccine  0.5 mL Intramuscular Tomorrow-1000   scopolamine   1 patch Transdermal Q72H   sodium chloride  flush  10-40 mL Intracatheter Q12H   tamsulosin   0.4 mg Oral Daily    PRN meds: fentaNYL  (SUBLIMAZE ) injection, hydrALAZINE , ondansetron  (ZOFRAN ) IV, sodium chloride  flush   Infusions:   amiodarone  30 mg/hr (04/19/24 0957)   heparin  1,850 Units/hr (04/19/24 0900)   potassium chloride  10 mEq (04/19/24 1117)   TPN ADULT (ION) 85 mL/hr at 04/19/24 0900   TPN ADULT (ION)      Antimicrobials: Anti-infectives (From admission, onward)    Start     Dose/Rate Route Frequency Ordered Stop   04/14/24 1000  vancomycin  (VANCOREADY) IVPB 1500 mg/300 mL  Status:  Discontinued        1,500 mg 150 mL/hr over 120 Minutes Intravenous Every 24 hours 04/13/24 0731 04/15/24 1416   04/14/24 0700  vancomycin  (VANCOREADY) IVPB 1750 mg/350 mL  Status:  Discontinued        1,750 mg 175 mL/hr over 120 Minutes Intravenous Every 24 hours 04/13/24 0547 04/13/24 0731   04/13/24 0615  vancomycin  (VANCOREADY) IVPB 1750 mg/350 mL        1,750 mg 175 mL/hr over 120 Minutes Intravenous  Once 04/13/24 0525 04/13/24 0811   04/13/24 0400  piperacillin -tazobactam (ZOSYN ) IVPB 3.375 g  Status:  Discontinued        3.375 g 100 mL/hr over 30 Minutes Intravenous Every 6 hours 04/13/24 0240 04/13/24 0244   04/13/24 0400  piperacillin -tazobactam (ZOSYN ) IVPB 3.375 g  Status:  Discontinued        3.375 g 12.5 mL/hr over 240 Minutes Intravenous Every 8 hours 04/13/24 0245 04/18/24 0936   04/12/24 2245  piperacillin -tazobactam (ZOSYN ) IVPB 3.375 g        3.375 g 100 mL/hr over 30 Minutes Intravenous  Once 04/12/24 2237 04/12/24 2333       Objective: Vitals:   04/19/24 0800 04/19/24 1000  BP: (!) 155/78 (!) 168/141  Pulse: (!) 37   Resp: (!) 23 (!) 33  Temp:  99.7 F (37.6 C)   SpO2: 96%     Intake/Output Summary (Last 24 hours) at 04/19/2024 1148 Last data filed at 04/19/2024 0900 Gross per 24 hour  Intake 2744.85 ml  Output 1360 ml  Net 1384.85 ml   Filed Weights   04/17/24 0702 04/18/24 0400 04/19/24 0428  Weight: 95.9 kg 88.8 kg 88.3 kg   Weight change: -7.6 kg Body mass index is 28.75 kg/m.   Physical Exam: General exam: Pleasant, elderly Caucasian male. Skin: No rashes, lesions or ulcers. HEENT: Atraumatic, normocephalic, no obvious bleeding Lungs: Clear to auscultation bilaterally,  CVS: S1, S2, faint pansystolic murmur GI/Abd: Soft, appropriate postop tenderness, nondistended, bowel sound very sluggish,   CNS: Slow to respond but alert, awake, oriented x 3 Psychiatry: Sad affect Extremities: No pedal edema, no calf tenderness  Data Review: I have personally reviewed the laboratory data and studies available.  F/u labs  Unresulted Labs (From admission, onward)     Start     Ordered   04/21/24 0500  Triglycerides  (TPN Lab Panel)  Every Monday (0500),   R     Question:  Specimen collection method  Answer:  Unit=Unit collect   04/15/24 1416   04/20/24 0500  Basic metabolic panel with GFR  Tomorrow morning,   R       Question:  Specimen collection method  Answer:  Unit=Unit collect   04/19/24 0901   04/20/24 0500  Magnesium   Tomorrow morning,   R       Question:  Specimen collection method  Answer:  Unit=Unit collect   04/19/24 0901   04/20/24 0500  Phosphorus  Tomorrow morning,   R       Question:  Specimen collection method  Answer:  Unit=Unit collect   04/19/24 0901   04/17/24 0500  Comprehensive metabolic panel  (TPN Lab Panel)  Every Mon,Thu (0500),   R     Question:  Specimen collection method  Answer:  Unit=Unit collect   04/15/24 1416   04/17/24 0500  Magnesium   (TPN Lab Panel)  Every Mon,Thu (0500),   R     Question:  Specimen collection  method  Answer:  Unit=Unit collect   04/15/24 1416   04/17/24 0500   Phosphorus  (TPN Lab Panel)  Every Mon,Thu (0500),   R     Question:  Specimen collection method  Answer:  Unit=Unit collect   04/15/24 1416   04/16/24 0500  Heparin  level (unfractionated)  Daily,   R     Question:  Specimen collection method  Answer:  Unit=Unit collect   04/15/24 1910   04/14/24 0500  Creatinine, serum  Daily,   R      04/13/24 0545            Admission date and time: 04/12/2024  8:21 PM    Signed, Hoyt Macleod, MD [facility] 04/19/2024

## 2024-04-19 NOTE — Progress Notes (Signed)
 Progress Note  5 Days Post-Op  Subjective: States he hasn't passed any flatus or had any bowel function.    Objective: Vital signs in last 24 hours: Temp:  [98.1 F (36.7 C)-98.7 F (37.1 C)] 98.3 F (36.8 C) (05/17 0400) Pulse Rate:  [51-105] 88 (05/17 0700) Resp:  [14-27] 22 (05/17 0700) BP: (113-196)/(66-111) 196/103 (05/17 0700) SpO2:  [92 %-100 %] 99 % (05/17 0700) Weight:  [88.3 kg] 88.3 kg (05/17 0428) Last BM Date :  (PTA)  Intake/Output from previous day: 05/16 0701 - 05/17 0700 In: 2783.4 [I.V.:2650.4; IV Piggyback:133] Out: 2160 [Urine:1815; Emesis/NG output:345] Intake/Output this shift: No intake/output data recorded.  PE: General: pleasant, WD, WN male who is laying in bed in NAD Heart: irregularly irregular Lungs: Respiratory effort nonlabored Abd: soft, appropriately ttp, midline wound with VAC present, NGT with bilious fluid Psych: A&Ox3 with an appropriate affect.    Lab Results:  Recent Labs    04/18/24 0529 04/19/24 0555  WBC 9.0 11.4*  HGB 10.1* 9.5*  HCT 34.0* 32.3*  PLT 169 178   BMET Recent Labs    04/18/24 0529 04/19/24 0555  NA 131* 133*  K 3.7 3.9  CL 102 104  CO2 23 22  GLUCOSE 128* 106*  BUN 18 21  CREATININE 0.78 0.70  CALCIUM 7.8* 8.2*   PT/INR No results for input(s): "LABPROT", "INR" in the last 72 hours. CMP     Component Value Date/Time   NA 133 (L) 04/19/2024 0555   K 3.9 04/19/2024 0555   CL 104 04/19/2024 0555   CO2 22 04/19/2024 0555   GLUCOSE 106 (H) 04/19/2024 0555   BUN 21 04/19/2024 0555   CREATININE 0.70 04/19/2024 0555   CALCIUM 8.2 (L) 04/19/2024 0555   PROT 6.0 (L) 04/17/2024 0510   ALBUMIN  2.6 (L) 04/17/2024 0510   AST 58 (H) 04/17/2024 0510   ALT 183 (H) 04/17/2024 0510   ALKPHOS 62 04/17/2024 0510   BILITOT 1.6 (H) 04/17/2024 0510   GFRNONAA >60 04/19/2024 0555   Lipase     Component Value Date/Time   LIPASE 52 (H) 04/12/2024 2049       Studies/Results: No results  found.   Anti-infectives: Anti-infectives (From admission, onward)    Start     Dose/Rate Route Frequency Ordered Stop   04/14/24 1000  vancomycin  (VANCOREADY) IVPB 1500 mg/300 mL  Status:  Discontinued        1,500 mg 150 mL/hr over 120 Minutes Intravenous Every 24 hours 04/13/24 0731 04/15/24 1416   04/14/24 0700  vancomycin  (VANCOREADY) IVPB 1750 mg/350 mL  Status:  Discontinued        1,750 mg 175 mL/hr over 120 Minutes Intravenous Every 24 hours 04/13/24 0547 04/13/24 0731   04/13/24 0615  vancomycin  (VANCOREADY) IVPB 1750 mg/350 mL        1,750 mg 175 mL/hr over 120 Minutes Intravenous  Once 04/13/24 0525 04/13/24 0811   04/13/24 0400  piperacillin -tazobactam (ZOSYN ) IVPB 3.375 g  Status:  Discontinued        3.375 g 100 mL/hr over 30 Minutes Intravenous Every 6 hours 04/13/24 0240 04/13/24 0244   04/13/24 0400  piperacillin -tazobactam (ZOSYN ) IVPB 3.375 g  Status:  Discontinued        3.375 g 12.5 mL/hr over 240 Minutes Intravenous Every 8 hours 04/13/24 0245 04/18/24 0936   04/12/24 2245  piperacillin -tazobactam (ZOSYN ) IVPB 3.375 g        3.375 g 100 mL/hr over 30 Minutes Intravenous  Once 04/12/24 2237 04/12/24 2333        Assessment/Plan SMV thrombosis and Small bowel ischemia  POD6 ex-lap, small bowel resection, open abdominal VAC Dr. Ramiro Burly POD5 ex-lap, resection 40 cm ileum with primary anastomosis and closure of abdomen Dr. Afton Horse - clamping trial again today - Do not remove NG till passing flatus - continue TPN - mobilize as tolerated  - VAC changes M-R  FEN: NPO, TPN, NGT clamping trials VTE: hep gtt ID: [id number]  5/10>5/16  - per CCM -  HFrEF HTN Thyroid disease A. Fib not on anticoagulation  LOS: 6 days     Junie Olds, MD  [facility] 04/19/2024, 8:04 AM Please see Amion for pager number during day hours 7:00am-4:30pm

## 2024-04-19 NOTE — Progress Notes (Signed)
 PHARMACY - TOTAL PARENTERAL NUTRITION CONSULT NOTE   Indication: bowel obstruction  Patient Measurements: Height: 5\' 9"  (175.3 cm) Weight: 88.3 kg (194 lb 10.7 oz) IBW/kg (Calculated) : 70.7 TPN AdjBW (KG): 75.2 Body mass index is 28.75 kg/m. Usual Weight:   Assessment:  Donald Morales admitted on 5/11 with abdominal pain and AFib with RVR and concern of SMV thrombosis.  S/p small bowel resection x2 with anastomosis and abdominal wall closure on 5/12.  Pharmacy is consulted to dose TPN.  Glucose / Insulin : No hx DM noted.  A1c 6.3.   - 5/10 Dexamethasone  10mg , 5/12 Dexamethasone  5mg  - CBGs < 180 (range 116-136) on mSSI (8 units/ 24 hrs) Electrolytes: Elytes WNL except:  Na+  now up to 133  - K+ improved to 3.9, Corr Ca is 9.3 Renal: SCr and BUN WNL Hepatic: LFTs and Tbili mildly elevated, trended down.  Tbili up to 1.6 - TG 45 (5/15) Intake / Output; MIVF:  - Intake: no mIVF.  Lasix  IV x1 on 5/14 - Output: UOP 1815 mL, drains 0  mL, NG 345 mL    GI Imaging: GI Surgeries / Procedures:  - 5/11 OR:  lap > open for necrotic small bowel w/ 40cm small bowel resected and notable mesenteric vein thromboses. Left in discontinuity. - 5/12 OR: 2nd look, 40 cm small bowel resection, anastomosis, abdominal wall closure  Central access: CVC 5/11 TPN start date: 5/13  Nutritional Goals: Goal TPN rate is 85 mL/hr and provides 122 g of protein and 1871 kcals per day.   RD Assessment: ( 5/16)  Estimated Needs Total Energy Estimated Needs: 1800-2200 kcals Total Protein Estimated Needs: 110-125 grams Total Fluid Estimated Needs: >/= 1.8L  Current Nutrition:  NPO  Plan:  Now - Potassium chloride  IV  10 meq IV x 2   Advance TPN to 85 mL/hr at 1800, the target rate  Electrolytes in TPN:  Na inc to 105 mEq/L K inc to 75 mEq/L Ca 8 mEq/L Mg 33mEq/L  Phos 20 mmol/L Cl:Ac 1:1 Add standard MVI and trace elements to TPN Continue moderate SSI and adjust as needed  No IVF per MD. Monitor TPN  labs on Mon/Thurs, recheck bmet, mag, phos with AM labs  BMP, magnesium , phosphorus with AM labs    Van Gelinas, PharmD, BCPS 04/19/2024 8:26 AM

## 2024-04-20 ENCOUNTER — Inpatient Hospital Stay (HOSPITAL_COMMUNITY)

## 2024-04-20 DIAGNOSIS — I5023 Acute on chronic systolic (congestive) heart failure: Secondary | ICD-10-CM | POA: Diagnosis not present

## 2024-04-20 DIAGNOSIS — I4819 Other persistent atrial fibrillation: Secondary | ICD-10-CM | POA: Diagnosis not present

## 2024-04-20 DIAGNOSIS — K559 Vascular disorder of intestine, unspecified: Secondary | ICD-10-CM | POA: Diagnosis not present

## 2024-04-20 LAB — BASIC METABOLIC PANEL WITH GFR
Anion gap: 8 (ref 5–15)
BUN: 35 mg/dL — ABNORMAL HIGH (ref 8–23)
CO2: 23 mmol/L (ref 22–32)
Calcium: 8.4 mg/dL — ABNORMAL LOW (ref 8.9–10.3)
Chloride: 103 mmol/L (ref 98–111)
Creatinine, Ser: 0.7 mg/dL (ref 0.61–1.24)
GFR, Estimated: >60 mL/min (ref >60)
Glucose, Bld: 146 mg/dL — ABNORMAL HIGH (ref 70–99)
Potassium: 4.4 mmol/L (ref 3.5–5.1)
Sodium: 134 mmol/L — ABNORMAL LOW (ref 135–145)

## 2024-04-20 LAB — GLUCOSE, CAPILLARY
Glucose-Capillary: 147 mg/dL — ABNORMAL HIGH (ref 70–99)
Glucose-Capillary: 153 mg/dL — ABNORMAL HIGH (ref 70–99)
Glucose-Capillary: 148 mg/dL — ABNORMAL HIGH (ref 70–99)
Glucose-Capillary: 139 mg/dL — ABNORMAL HIGH (ref 70–99)
Glucose-Capillary: 147 mg/dL — ABNORMAL HIGH (ref 70–99)

## 2024-04-20 LAB — MAGNESIUM: Magnesium: 2.1 mg/dL (ref 1.7–2.4)

## 2024-04-20 LAB — HEPARIN LEVEL (UNFRACTIONATED)
Heparin Unfractionated: 0.81 [IU]/mL — ABNORMAL HIGH (ref 0.30–0.70)
Heparin Unfractionated: 0.59 [IU]/mL (ref 0.30–0.70)

## 2024-04-20 LAB — PHOSPHORUS: Phosphorus: 3.3 mg/dL (ref 2.5–4.6)

## 2024-04-20 MED ORDER — FUROSEMIDE 10 MG/ML IJ SOLN: 40.0000 mg | Freq: Every day | INTRAMUSCULAR | Status: DC

## 2024-04-20 MED ORDER — POTASSIUM ACETATE 2 MEQ/ML IV SOLN
INTRAVENOUS | Status: AC
  Filled 2024-04-20: qty 1224

## 2024-04-20 MED ORDER — PROCHLORPERAZINE EDISYLATE 10 MG/2ML IJ SOLN
5.0000 mg | Freq: Four times a day (QID) | INTRAMUSCULAR | Status: DC | PRN
  Administered 2024-04-20: 5 mg via INTRAVENOUS
  Filled 2024-04-20: qty 2

## 2024-04-20 NOTE — Progress Notes (Signed)
 Patient began to vomit a large amount of dark black substance. Surgery was paged I was instructed to place a NG tube in to LIWS. NG tube was placed and bridled into place. Xray was ordered for confirmation. Hospitalist arrived at bedside during this and instructed to hold PO meds and lasix .  Lelon Putty

## 2024-04-20 NOTE — Progress Notes (Signed)
 Progress Note  Patient Name: Donald Morales Date of Encounter: 04/20/2024  Primary Cardiologist: None   Subjective   Not feeling too good today. He continues to have emesis.  Inpatient Medications    Scheduled Meds:  Chlorhexidine  Gluconate Cloth  6 each Topical Q0600   furosemide   40 mg Intravenous BID   insulin  aspart  0-15 Units Subcutaneous Q4H   [START ON 04/25/2024] levothyroxine   25 mcg Intravenous Daily   metoprolol  tartrate  2.5 mg Intravenous Q6H   pantoprazole  (PROTONIX ) IV  40 mg Intravenous Q24H   pneumococcal 20-valent conjugate vaccine  0.5 mL Intramuscular Tomorrow-1000   scopolamine   1 patch Transdermal Q72H   sodium chloride  flush  10-40 mL Intracatheter Q12H   tamsulosin   0.4 mg Oral Daily   Continuous Infusions:  amiodarone  30 mg/hr (04/20/24 0500)   heparin  1,750 Units/hr (04/20/24 0554)   TPN ADULT (ION) 85 mL/hr at 04/20/24 0500   PRN Meds: fentaNYL  (SUBLIMAZE ) injection, hydrALAZINE , ondansetron  (ZOFRAN ) IV, prochlorperazine, sodium chloride  flush   Vital Signs    Vitals:   04/20/24 0200 04/20/24 0300 04/20/24 0400 04/20/24 0500  BP: 121/72 129/69 132/80 119/65  Pulse: 73 89 87 72  Resp: (!) 21 20 18 17   Temp:   98.4 F (36.9 C)   TempSrc:   Oral   SpO2: 97% 98% 96% 99%  Weight:    89 kg  Height:        Intake/Output Summary (Last 24 hours) at 04/20/2024 0720 Last data filed at 04/20/2024 0554 Gross per 24 hour  Intake 2634.98 ml  Output 1650 ml  Net 984.98 ml   Filed Weights   04/18/24 0400 04/19/24 0428 04/20/24 0500  Weight: 88.8 kg 88.3 kg 89 kg    Telemetry    Atrial fibrillation; rates controlled - Personally Reviewed  ECG    No new - Personally Reviewed  Physical Exam   GEN: No acute distress.   Neck: No JVD Cardiac: RRR, no murmurs, rubs, or gallops.  Respiratory: Clear to auscultation bilaterally. GI: Soft, nontender, non-distended  MS: No edema; No deformity. Neuro:  Nonfocal  Psych: Normal affect   Labs     Chemistry Recent Labs  Lab 04/16/24 0530 04/17/24 0510 04/18/24 0529 04/19/24 0555 04/20/24 0355  NA 139 135 131* 133* 134*  K 3.4* 3.2* 3.7 3.9 4.4  CL 108 103 102 104 103  CO2 22 27 23 22 23   GLUCOSE 159* 117* 128* 106* 146*  BUN 29* 21 18 21  35*  CREATININE 0.98 0.73 0.78 0.70 0.70  CALCIUM 7.5* 7.7* 7.8* 8.2* 8.4*  PROT 5.6* 6.0*  --   --   --   ALBUMIN  2.6* 2.6*  --   --   --   AST 69* 58*  --   --   --   ALT 214* 183*  --   --   --   ALKPHOS 57 62  --   --   --   BILITOT 1.1 1.6*  --   --   --   GFRNONAA >60 >60 >60 >60 >60  ANIONGAP 9 5 6 7 8      Hematology Recent Labs  Lab 04/17/24 0510 04/18/24 0529 04/19/24 0555  WBC 10.3 9.0 11.4*  RBC 4.32 4.51 4.28  HGB 9.9* 10.1* 9.5*  HCT 32.6* 34.0* 32.3*  MCV 75.5* 75.4* 75.5*  MCH 22.9* 22.4* 22.2*  MCHC 30.4 29.7* 29.4*  RDW 15.3 15.8* 15.8*  PLT 165 169 178  Cardiac EnzymesNo results for input(s): "TROPONINI" in the last 168 hours. No results for input(s): "TROPIPOC" in the last 168 hours.   BNP Recent Labs  Lab 04/14/24 0941 04/15/24 0431 04/16/24 0500  BNP 179.0* 150.7* 169.0*     DDimer No results for input(s): "DDIMER" in the last 168 hours.   Summary of Pertinent studies    TTE: TTE 5.11: EF 30 to 35%.  Regional wall motion abnormalities present.  Moderate to severe mitral valve regurgitation.  Moderate tricuspid regurgitation.    Patient Profile     68 y.o. 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 Gwynneth Lessen.   Assessment & Plan    Acute on chronic CHFrEF Appears reasonably compensated at present Continue gentle diuresis -- will scale back diuretics with BUN increase  Add metoprolol  25mg  BID - plan to switch to XL or carvedilol on  discharge Add ARB with plans to switch to Entresto prior to discharge when taking PO Would not resume diltiazem  on discharge With wall motion abnormalities present on echo, would refer for outpatient coronary evaluation Will need to add spironolactone and jardiance when taking PO   Atrial fibrillation Persistent I would not pursue rhythm control presently since the patient has not been anticoagulated -- switch to metoprolol  PO when able Amiodarone  IV can be continued for now for rate control  Start metoprolol  PO 25 mg BID - can titrate up q8 hours to achieve effect Continue IV heparin . Start DOAC when appropriate from operative standpoint and taking PO   Severe MR Diuresis Reevaluate as outpatient   SMA thrombosis AF with cardioembolism is a possible source He declined anticoagulation in the past Management per primary and surgical teams   Hypertension Will add on GDMT Add ARB, entresto if BP will tolerate, when taking PO medications Add metoprolol  25 BID    Suspected CAD - with wall motion abnormalities on TTE Would add rosuvastatin 20   For questions or updates, please contact [facility] Please consult [email or web address] for contact info under Cardiology/STEMI.      Signed, Efraim Grange, MD 04/20/2024, 7:20 AM

## 2024-04-20 NOTE — Progress Notes (Signed)
 Progress Note  6 Days Post-Op  Subjective: Tolerated NG clamping and was passing gas so NG was removed.  Has vomited green stuff a few times since NG removed, but still passing gas - confirmed by nursing staff.  Objective: Vital signs in last 24 hours: Temp:  [97.5 F (36.4 C)-98.4 F (36.9 C)] 98.3 F (36.8 C) (05/18 0728) Pulse Rate:  [35-105] 72 (05/18 0500) Resp:  [17-33] 17 (05/18 0500) BP: (113-168)/(65-141) 119/65 (05/18 0500) SpO2:  [96 %-100 %] 99 % (05/18 0500) Weight:  [89 kg] 89 kg (05/18 0500) Last BM Date :  (PTA)  Intake/Output from previous day: 05/17 0701 - 05/18 0700 In: 2635 [I.V.:2599.4; IV Piggyback:35.6] Out: 1650 [Urine:1650] Intake/Output this shift: No intake/output data recorded.  PE: General: pleasant, WD, WN male who is laying in bed in NAD Heart: irregularly irregular Lungs: Respiratory effort nonlabored Abd: soft, appropriately ttp, midline wound with VAC present, Psych: A&Ox3 with an appropriate affect.    Lab Results:  Recent Labs    04/18/24 0529 04/19/24 0555  WBC 9.0 11.4*  HGB 10.1* 9.5*  HCT 34.0* 32.3*  PLT 169 178   BMET Recent Labs    04/19/24 0555 04/20/24 0355  NA 133* 134*  K 3.9 4.4  CL 104 103  CO2 22 23  GLUCOSE 106* 146*  BUN 21 35*  CREATININE 0.70 0.70  CALCIUM 8.2* 8.4*   PT/INR No results for input(s): "LABPROT", "INR" in the last 72 hours. CMP     Component Value Date/Time   NA 134 (L) 04/20/2024 0355   K 4.4 04/20/2024 0355   CL 103 04/20/2024 0355   CO2 23 04/20/2024 0355   GLUCOSE 146 (H) 04/20/2024 0355   BUN 35 (H) 04/20/2024 0355   CREATININE 0.70 04/20/2024 0355   CALCIUM 8.4 (L) 04/20/2024 0355   PROT 6.0 (L) 04/17/2024 0510   ALBUMIN  2.6 (L) 04/17/2024 0510   AST 58 (H) 04/17/2024 0510   ALT 183 (H) 04/17/2024 0510   ALKPHOS 62 04/17/2024 0510   BILITOT 1.6 (H) 04/17/2024 0510   GFRNONAA >60 04/20/2024 0355   Lipase     Component Value Date/Time   LIPASE 52 (H) 04/12/2024  2049       Studies/Results: No results found.   Anti-infectives: Anti-infectives (From admission, onward)    Start     Dose/Rate Route Frequency Ordered Stop   04/14/24 1000  vancomycin  (VANCOREADY) IVPB 1500 mg/300 mL  Status:  Discontinued        1,500 mg 150 mL/hr over 120 Minutes Intravenous Every 24 hours 04/13/24 0731 04/15/24 1416   04/14/24 0700  vancomycin  (VANCOREADY) IVPB 1750 mg/350 mL  Status:  Discontinued        1,750 mg 175 mL/hr over 120 Minutes Intravenous Every 24 hours 04/13/24 0547 04/13/24 0731   04/13/24 0615  vancomycin  (VANCOREADY) IVPB 1750 mg/350 mL        1,750 mg 175 mL/hr over 120 Minutes Intravenous  Once 04/13/24 0525 04/13/24 0811   04/13/24 0400  piperacillin -tazobactam (ZOSYN ) IVPB 3.375 g  Status:  Discontinued        3.375 g 100 mL/hr over 30 Minutes Intravenous Every 6 hours 04/13/24 0240 04/13/24 0244   04/13/24 0400  piperacillin -tazobactam (ZOSYN ) IVPB 3.375 g  Status:  Discontinued        3.375 g 12.5 mL/hr over 240 Minutes Intravenous Every 8 hours 04/13/24 0245 04/18/24 0936   04/12/24 2245  piperacillin -tazobactam (ZOSYN ) IVPB 3.375 g  3.375 g 100 mL/hr over 30 Minutes Intravenous  Once 04/12/24 2237 04/12/24 2333        Assessment/Plan SMV thrombosis and Small bowel ischemia  POD7 ex-lap, small bowel resection, open abdominal VAC Dr. Ramiro Burly POD6 ex-lap, resection 40 cm ileum with primary anastomosis and closure of abdomen Dr. Afton Horse - NG removed, some vomiting and flatus, hold at clear liquids for now - continue TPN - mobilize as tolerated  - VAC changes M-R  FEN: NPO, TPN, NGT clamping trials VTE: hep gtt ID: [id number]  5/10>5/16  - per CCM -  HFrEF HTN Thyroid disease A. Fib not on anticoagulation  LOS: 7 days     Junie Olds, MD  [facility] Surgery 04/20/2024, 8:31 AM Please see Amion for pager number during day hours 7:00am-4:30pm

## 2024-04-20 NOTE — Progress Notes (Signed)
 Patient was found vomiting large amount of gastric content in the emesis bag and had spilled all over the bed. Zofran  was given and patient stated he feels much better after vomit and no longer nauseous. Provider notified, added compazine and stated to monitor and be notified in case of another occurrence.

## 2024-04-20 NOTE — Progress Notes (Signed)
 PROGRESS NOTE  Donald Morales  DOB: 06/26/1956  PCP: Rae Bugler, MD FAO:[id number]  DOA: 04/12/2024  LOS: 7 days  Hospital Day: 9  Brief narrative: Donald Morales is a 68 y.o. adult with PMH significant for HTN, HLD, atrial flutter not on anticoagulation, hypothyroidism, BPH. 5/11, patient presented to the ED with progressive abdominal pain for few weeks worse on last few days.  Last BM was 5 days prior to presentation.  CT abdomen showed bowel wall thickening and also raised concern for SMV thrombosis.  Labs showed WC count of 12.1 and lactic acid elevated 2.8 and trended up to 4.9  Patient was seen by general surgery and taken to the OR emergently. Underwent diagnostic laparoscopy, converted to open.  Underwent resection of 40 cm of necrotic small bowel.  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 Left in discontinuity to return to OR in 24-48hrs.  Patient was admitted to ICU postop.  Required 20 phenylephrine  drip. 5/12, taken to OR for further bowel resection and closure of abdomen.  Postop, patient had Afib with RVR required amiodarone .  Eventually weaned off pressors, started on TPN,anticoagulation with IV heparin  drip.  5/14, extubated  5/16, transferred out to [facility]  Subjective: Patient was seen and examined this morning.   Yesterday, patient tolerated NG clamping and was passing gas and NGT was removed but started vomiting again. General surgery decided to reinsert NG tube this morning. At the time of my evaluation this morning, nurses were trying to put NG tube and patient was actively vomiting. Unable to tolerate oral meds Remained on IV amiodarone  drip Labs from this morning with BUN elevated after 1 dose of IV Lasix  40 mg yesterday.  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 No fever, WBC count stable. Required vasopressors postop.  Weaned off. Hemodynamically improving now Pain control as needed IV fentanyl  Recent Labs  Lab 04/13/24 1120 04/14/24 0352 04/15/24 0431 04/16/24 0530 04/17/24 0510 04/18/24 0529 04/19/24 0555  WBC  --    < > 12.2* 10.4 10.3 9.0 11.4*  LATICACIDVEN 2.1*  --   --   --   --   --  1.0   < > = values in this interval not displayed.   Postop ileus Passed gas yesterday and NGT was removed but started throwing up again.   NG tube being reinserted again this morning.   Remains on TPN. Noted a plan for general surgery to retry NG tube clamping again today  continue PPI, IV Zofran  as needed  Hypokalemia/hypophosphatemia Secondary to GI loss from NG tube drainage Levels improved with replacement. Recent Labs  Lab 04/16/24 0530 04/17/24 0510 04/18/24 0529 04/19/24 0555 04/20/24 0355  K 3.4* 3.2* 3.7 3.9 4.4  MG 2.2 2.0 1.9 1.9 2.1  PHOS 1.8* 2.3* 2.9 2.9 3.3   Hyponatremia Likely from low intake.  Continue to monitor. Recent Labs  Lab 04/13/24 1608 04/14/24 0352 04/15/24 0431 04/16/24 0530 04/17/24 0510 04/18/24 0529 04/19/24 0555 04/20/24 0355  NA 135 137 138 139 135 131* 133* 134*   Acute worsening of chronic systolic CHF Moderate to severe MR  Hypertension EF in 2023 with 35 to 40%.  Echo 5/11 showed EF of 30 to 35% with regional WMA and moderately reduced RV function, moderate to severe  MR, moderate TR. Seen by cardiology.  Patient needs GDMT initiated once oral intake improves. IV Lasix  40 mg 1 dose was given yesterday with elevation in BUN this morning.  He is actively losing fluid by vomiting.  I would skip IV Lasix  today.  I talked to bedside RN  A-fib with RVR Has history of A-fib and was seen by EP in the past but did not follow-up.  Qualified for anticoagulation but did not comply with it. With RVR this hospitalization, patient was started on  amiodarone  drip, digoxin  and heparin  drip. Cardiology consult appreciated. Currently continued on IV amiodarone  and IV heparin  drip  Acute blood loss anemia Hemoglobin trend as below.  Remains between 9 and 10.  Continue to monitor.  Transfuse if less than 7.   Recent Labs    04/15/24 0431 04/16/24 0530 04/17/24 0510 04/18/24 0529 04/19/24 0555  HGB 9.3* 9.1* 9.9* 10.1* 9.5*  MCV 79.6* 75.4* 75.5* 75.4* 75.5*    Elevated LFTs  likely component of shock liver Gradually improving Repeat labs tomorrow Recent Labs  Lab 04/16/24 0530 04/17/24 0510  AST 69* 58*  ALT 214* 183*  ALKPHOS 57 62  BILITOT 1.1 1.6*  PROT 5.6* 6.0*  ALBUMIN  2.6* 2.6*   Hyperglycemia A1c 6.3.  No prior history of diabetes Currently on SSI/Accu-Cheks Recent Labs  Lab 04/19/24 1543 04/19/24 1953 04/19/24 2346 04/20/24 0325 04/20/24 0728  GLUCAP 139* 164* 160* 147* 153*   Hx of hypothyroidism tsh 4.6 Oral Synthroid  resume once able to tolerate oral intake IV Synthroid  currently.  Acute urinary retention  BPH Patient endorses lower urinary tract symptoms but states he was afraid to go to the urologist.   5/16, started on Flomax .  Able to void.  Continue to monitor.   Mobility: PT encourage ambulation once clinically improved.  Goals of care   Code Status: Full Code     DVT prophylaxis:  SCDs Start: 04/13/24 0500   Antimicrobials: IV Zosyn  Fluid: TPN Consultants: General Surgery, cardiology  Family Communication: None at bedside  Status: Inpatient Level of care:  ICU   Patient is from: Home Needs to continue in-hospital care: Postop ileus status Anticipated d/c to: Pending clinical course   Diet:  Diet Order             Diet clear liquid Room service appropriate? Yes; Fluid consistency: Thin  Diet effective now                   Scheduled Meds:  Chlorhexidine  Gluconate Cloth  6 each Topical Q0600   furosemide   40 mg Intravenous Daily   insulin  aspart  0-15  Units Subcutaneous Q4H   [START ON 04/25/2024] levothyroxine   25 mcg Intravenous Daily   metoprolol  tartrate  2.5 mg Intravenous Q6H   pantoprazole  (PROTONIX ) IV  40 mg Intravenous Q24H   pneumococcal 20-valent conjugate vaccine  0.5 mL Intramuscular Tomorrow-1000   scopolamine   1 patch Transdermal Q72H   sodium chloride  flush  10-40 mL Intracatheter Q12H   tamsulosin   0.4 mg Oral Daily    PRN meds: fentaNYL  (SUBLIMAZE ) injection, hydrALAZINE , ondansetron  (ZOFRAN ) IV, prochlorperazine, sodium chloride  flush   Infusions:   amiodarone  30 mg/hr (04/20/24 1000)   heparin  1,750 Units/hr (04/20/24 1000)   TPN ADULT (ION) 85 mL/hr at 04/20/24 1000    Antimicrobials: Anti-infectives (From admission, onward)    Start     Dose/Rate Route Frequency Ordered Stop   04/14/24 1000  vancomycin  (VANCOREADY) IVPB 1500 mg/300 mL  Status:  Discontinued        1,500 mg 150 mL/hr over 120 Minutes Intravenous Every 24 hours 04/13/24 0731 04/15/24 1416   04/14/24 0700  vancomycin  (VANCOREADY) IVPB 1750 mg/350 mL  Status:  Discontinued        1,750 mg 175 mL/hr over 120 Minutes Intravenous Every 24 hours 04/13/24 0547 04/13/24 0731   04/13/24 0615  vancomycin  (VANCOREADY) IVPB 1750 mg/350 mL        1,750 mg 175 mL/hr over 120 Minutes Intravenous  Once 04/13/24 0525 04/13/24 0811   04/13/24 0400  piperacillin -tazobactam (ZOSYN ) IVPB 3.375 g  Status:  Discontinued        3.375 g 100 mL/hr over 30 Minutes Intravenous Every 6 hours 04/13/24 0240 04/13/24 0244   04/13/24 0400  piperacillin -tazobactam (ZOSYN ) IVPB 3.375 g  Status:  Discontinued        3.375 g 12.5 mL/hr over 240 Minutes Intravenous Every 8 hours 04/13/24 0245 04/18/24 0936   04/12/24 2245  piperacillin -tazobactam (ZOSYN ) IVPB 3.375 g        3.375 g 100 mL/hr over 30 Minutes Intravenous  Once 04/12/24 2237 04/12/24 2333       Objective: Vitals:   04/20/24 0900 04/20/24 1000  BP: (!) 146/63 114/64  Pulse: 90 88  Resp: 20 13  Temp:     SpO2: 98% 95%    Intake/Output Summary (Last 24 hours) at 04/20/2024 1015 Last data filed at 04/20/2024 1000 Gross per 24 hour  Intake 2968.15 ml  Output 1650 ml  Net 1318.15 ml   Filed Weights   04/18/24 0400 04/19/24 0428 04/20/24 0500  Weight: 88.8 kg 88.3 kg 89 kg   Weight change: 0.7 kg Body mass index is 28.98 kg/m.   Physical Exam: General exam: Pleasant, elderly Caucasian male.  In distress because of active vomiting Skin: No rashes, lesions or ulcers. HEENT: Atraumatic, normocephalic, no obvious bleeding Lungs: Clear to auscultation bilaterally,  CVS: S1, S2, faint pansystolic murmur GI/Abd: Soft, appropriate postop tenderness, nondistended, bowel sound very sluggish,   CNS: Slow to respond but alert, awake, oriented x 3 Psychiatry: Sad affect Extremities: No pedal edema, no calf tenderness  Data Review: I have personally reviewed the laboratory data and studies available.  F/u labs  Unresulted Labs (From admission, onward)     Start     Ordered   04/21/24 0500  Triglycerides  (TPN Lab Panel)  Every Monday (0500),   R     Question:  Specimen collection method  Answer:  Unit=Unit collect   04/15/24 1416   04/21/24 0500  CBC  Daily,   R     Question:  Specimen collection method  Answer:  Unit=Unit collect   04/20/24 0426   04/20/24 1200  Heparin  level (unfractionated)  Once-Timed,   TIMED       Question:  Specimen collection method  Answer:  Unit=Unit collect   04/20/24 0424   04/17/24 0500  Comprehensive metabolic panel  (TPN Lab Panel)  Every Mon,Thu (0500),   R     Question:  Specimen collection method  Answer:  Unit=Unit collect   04/15/24 1416   04/17/24 0500  Magnesium   (TPN Lab Panel)  Every Mon,Thu (0500),   R     Question:  Specimen collection method  Answer:  Unit=Unit collect   04/15/24 1416   04/17/24 0500  Phosphorus  (TPN Lab Panel)  Every Mon,Thu (0500),   R     Question:  Specimen collection method  Answer:  Unit=Unit  collect   04/15/24 1416    04/14/24 0500  Creatinine, serum  Daily,   R      04/13/24 0545           Signed, Hoyt Macleod, MD [facility] 04/20/2024

## 2024-04-20 NOTE — Progress Notes (Signed)
 PHARMACY - ANTICOAGULATION CONSULT NOTE  Pharmacy Consult for Heparin  Indication: atrial fibrillation, SMV thrombosis  Allergies  Allergen Reactions   Aspirin     81 mg - epistaxis/gums bleeding   Codeine     hallucinations    Patient Measurements: Height: 5\' 9"  (175.3 cm) Weight: 89 kg (196 lb 3.4 oz) IBW/kg (Calculated) : 70.7 HEPARIN  DW (KG): 88.5  Vital Signs: Temp: 98.3 F (36.8 C) (05/18 0728) Temp Source: Oral (05/18 0728) BP: 114/64 (05/18 1000) Pulse Rate: 88 (05/18 1000)  Labs: Recent Labs    04/18/24 0529 04/19/24 0555 04/20/24 0355 04/20/24 1210  HGB 10.1* 9.5*  --   --   HCT 34.0* 32.3*  --   --   PLT 169 178  --   --   HEPARINUNFRC 0.48 0.57 0.81* 0.59  CREATININE 0.78 0.70 0.70  --     Estimated Creatinine Clearance (by C-G formula based on SCr of 0.7 mg/dL) Male: 16.1 mL/min Male: 101.6 mL/min   Medical History: Past Medical History:  Diagnosis Date   BPH (benign prostatic hyperplasia)    History of epistaxis    Hyperlipidemia    Muscle ache    Persistent atrial fibrillation (HCC)    Premature ventricular contraction    RBBB    RBBB    Thyroid disease    Typical atrial flutter (HCC)     Medications:  Infusions:   amiodarone  30 mg/hr (04/20/24 1015)   heparin  1,750 Units/hr (04/20/24 1206)   TPN ADULT (ION) 85 mL/hr at 04/20/24 1000   TPN ADULT (ION)      Assessment: 67 yoM admitted on 5/11 with abdominal pain and AFib with RVR.  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  PMH significant for AFib, but has been off medications/anticoagulation for past 2 years.  Pharmacy is consulted to dose heparin  IV.  Significant events: - 5/12 Heparin  held at 4am for surgery, resumed on 5/12 at 19:00  Today, 04/20/2024: HL 0.59  herapeutic on 1750 units/hr No bleeding or line issues  charted     Goal of Therapy:  Heparin  level 0.3-0.7 units/ml Monitor platelets by anticoagulation protocol: Yes   Plan:  Continue heparin  drip to  1750 units/hr Daily heparin  level and CBC Monitor for signs and symptoms of bleeding    Van Gelinas, PharmD, BCPS 04/20/2024 1:13 PM

## 2024-04-20 NOTE — Progress Notes (Signed)
 PHARMACY - ANTICOAGULATION CONSULT NOTE  Pharmacy Consult for Heparin  Indication: atrial fibrillation, SMV thrombosis  Allergies  Allergen Reactions   Aspirin     81 mg - epistaxis/gums bleeding   Codeine     hallucinations    Patient Measurements: Height: 5\' 9"  (175.3 cm) Weight: 88.3 kg (194 lb 10.7 oz) IBW/kg (Calculated) : 70.7 HEPARIN  DW (KG): 88.5  Vital Signs: Temp: 98.4 F (36.9 C) (05/18 0400) Temp Source: Oral (05/18 0400) BP: 132/80 (05/18 0400) Pulse Rate: 87 (05/18 0400)  Labs: Recent Labs    04/17/24 0510 04/17/24 1415 04/18/24 0529 04/19/24 0555 04/20/24 0355  HGB 9.9*  --  10.1* 9.5*  --   HCT 32.6*  --  34.0* 32.3*  --   PLT 165  --  169 178  --   HEPARINUNFRC 0.24*   < > 0.48 0.57 0.81*  CREATININE 0.73  --  0.78 0.70  --    < > = values in this interval not displayed.    Estimated Creatinine Clearance (by C-G formula based on SCr of 0.7 mg/dL) Male: 83 mL/min Male: 101.2 mL/min   Medical History: Past Medical History:  Diagnosis Date   BPH (benign prostatic hyperplasia)    History of epistaxis    Hyperlipidemia    Muscle ache    Persistent atrial fibrillation (HCC)    Premature ventricular contraction    RBBB    RBBB    Thyroid disease    Typical atrial flutter (HCC)     Medications:  Infusions:   amiodarone  30 mg/hr (04/20/24 0300)   heparin  1,850 Units/hr (04/20/24 0300)   TPN ADULT (ION) 85 mL/hr at 04/20/24 0300    Assessment: Donald Morales admitted on 5/11 with abdominal pain and AFib with RVR.  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  PMH significant for AFib, but has been off medications/anticoagulation for past 2 years.  Pharmacy is consulted to dose heparin  IV.  Significant events: - 5/12 Heparin  held at 4am for surgery, resumed on 5/12 at 19:00  Today, 04/20/2024: HL 0.81 slightly supra-therapeutic on 1850 units/hr No bleeding or line issues  charted     Goal of Therapy:  Heparin  level 0.3-0.7  units/ml Monitor platelets by anticoagulation protocol: Yes   Plan:  Decrease heparin  drip to 1750 units/hr Heparin  level in 6 hours Daily heparin  level and CBC Monitor for signs and symptoms of bleeding     Beau Bound RPh 04/20/2024, 4:23 AM

## 2024-04-20 NOTE — Progress Notes (Addendum)
 PHARMACY - TOTAL PARENTERAL NUTRITION CONSULT NOTE   Indication: bowel obstruction  Patient Measurements: Height: 5\' 9"  (175.3 cm) Weight: 89 kg (196 lb 3.4 oz) IBW/kg (Calculated) : 70.7 TPN AdjBW (KG): 75.2 Body mass index is 28.98 kg/m. Usual Weight:   Assessment:  89 yoM admitted on 5/11 with abdominal pain and AFib with RVR and concern of SMV thrombosis.  S/p small bowel resection x2 with anastomosis and abdominal wall closure on 5/12.  Pharmacy is consulted to dose TPN.  Glucose / Insulin : No hx DM noted.  A1c 6.3.   - 5/10 Dexamethasone  10mg , 5/12 Dexamethasone  5mg  - CBGs < 180 (range 136-160) on mSSI (13 units/ 24 hrs) Electrolytes: Elytes WNL except:  Na+ slightly up to 134  -  Corr Ca is 9.5 - Lasix  dose decreased to 40 mg IV daily  Renal: SCr and BUN WNL Hepatic: LFTs and Tbili mildly elevated, trended down.  Tbili up to 1.6 - TG 45 (5/15) Intake / Output; MIVF:  - Intake: no mIVF.  Lasix  IV x1 on 5/14 - Output: UOP 1650 mL, drains 0  mL, unmeasured emesis x 2, NG removed    GI Imaging: GI Surgeries / Procedures:  - 5/11 OR:  lap > open for necrotic small bowel w/ 40cm small bowel resected and notable mesenteric vein thromboses. Left in discontinuity. - 5/12 OR: 2nd look, 40 cm small bowel resection, anastomosis, abdominal wall closure  Central access: CVC 5/11 TPN start date: 5/13  Nutritional Goals: Goal TPN rate is 85 mL/hr and provides 122 g of protein and 1871 kcals per day.   RD Assessment: ( 5/16)  Estimated Needs Total Energy Estimated Needs: 1800-2200 kcals Total Protein Estimated Needs: 110-125 grams Total Fluid Estimated Needs: >/= 1.8L  Current Nutrition:  NPO  Plan:  TPN at 85 mL/hr at 1800, the target rate  Electrolytes in TPN:  Na inc to 120 mEq/L K  75 mEq/L Ca 8 mEq/L Mg 29mEq/L  Phos 20 mmol/L Cl:Ac 1:1 Add standard MVI and trace elements to TPN Continue moderate SSI and adjust as needed  No IVF per MD. Monitor TPN labs on  Mon/Thurs, recheck bmet, mag, phos with AM labs   Van Gelinas, PharmD, BCPS 04/20/2024 10:08 AM

## 2024-04-21 DIAGNOSIS — I4819 Other persistent atrial fibrillation: Secondary | ICD-10-CM | POA: Diagnosis not present

## 2024-04-21 DIAGNOSIS — I5022 Chronic systolic (congestive) heart failure: Secondary | ICD-10-CM | POA: Diagnosis not present

## 2024-04-21 DIAGNOSIS — D62 Acute posthemorrhagic anemia: Secondary | ICD-10-CM | POA: Diagnosis not present

## 2024-04-21 DIAGNOSIS — K559 Vascular disorder of intestine, unspecified: Secondary | ICD-10-CM | POA: Diagnosis not present

## 2024-04-21 LAB — GLUCOSE, CAPILLARY
Glucose-Capillary: 113 mg/dL — ABNORMAL HIGH (ref 70–99)
Glucose-Capillary: 114 mg/dL — ABNORMAL HIGH (ref 70–99)
Glucose-Capillary: 128 mg/dL — ABNORMAL HIGH (ref 70–99)
Glucose-Capillary: 134 mg/dL — ABNORMAL HIGH (ref 70–99)
Glucose-Capillary: 135 mg/dL — ABNORMAL HIGH (ref 70–99)
Glucose-Capillary: 137 mg/dL — ABNORMAL HIGH (ref 70–99)
Glucose-Capillary: 166 mg/dL — ABNORMAL HIGH (ref 70–99)

## 2024-04-21 LAB — COMPREHENSIVE METABOLIC PANEL WITH GFR
ALT: 57 U/L — ABNORMAL HIGH (ref 0–44)
AST: 29 U/L (ref 15–41)
Albumin: 2.3 g/dL — ABNORMAL LOW (ref 3.5–5.0)
Alkaline Phosphatase: 63 U/L (ref 38–126)
Anion gap: 8 (ref 5–15)
BUN: 38 mg/dL — ABNORMAL HIGH (ref 8–23)
CO2: 24 mmol/L (ref 22–32)
Calcium: 8.1 mg/dL — ABNORMAL LOW (ref 8.9–10.3)
Chloride: 104 mmol/L (ref 98–111)
Creatinine, Ser: 0.86 mg/dL (ref 0.61–1.24)
GFR, Estimated: >60 mL/min (ref >60)
Glucose, Bld: 164 mg/dL — ABNORMAL HIGH (ref 70–99)
Potassium: 4.2 mmol/L (ref 3.5–5.1)
Sodium: 136 mmol/L (ref 135–145)
Total Bilirubin: 2 mg/dL — ABNORMAL HIGH (ref 0.0–1.2)
Total Protein: 5.8 g/dL — ABNORMAL LOW (ref 6.5–8.1)

## 2024-04-21 LAB — CBC
HCT: 23.9 % — ABNORMAL LOW (ref 39.0–52.0)
Hemoglobin: 7 g/dL — ABNORMAL LOW (ref 13.0–17.0)
MCH: 22.4 pg — ABNORMAL LOW (ref 26.0–34.0)
MCHC: 29.3 g/dL — ABNORMAL LOW (ref 30.0–36.0)
MCV: 76.4 fL — ABNORMAL LOW (ref 80.0–100.0)
Platelets: 190 10*3/uL (ref 150–400)
RBC: 3.13 MIL/uL — ABNORMAL LOW (ref 4.22–5.81)
RDW: 16.8 % — ABNORMAL HIGH (ref 11.5–15.5)
WBC: 18.4 10*3/uL — ABNORMAL HIGH (ref 4.0–10.5)
nRBC: 0.3 % — ABNORMAL HIGH (ref 0.0–0.2)

## 2024-04-21 LAB — PREPARE RBC (CROSSMATCH)

## 2024-04-21 LAB — HEMOGLOBIN AND HEMATOCRIT, BLOOD
HCT: 23.5 % — ABNORMAL LOW (ref 39.0–52.0)
Hemoglobin: 7 g/dL — ABNORMAL LOW (ref 13.0–17.0)

## 2024-04-21 LAB — TRIGLYCERIDES: Triglycerides: 54 mg/dL (ref <150)

## 2024-04-21 LAB — HEPARIN LEVEL (UNFRACTIONATED): Heparin Unfractionated: 0.58 [IU]/mL (ref 0.30–0.70)

## 2024-04-21 LAB — PHOSPHORUS: Phosphorus: 3.3 mg/dL (ref 2.5–4.6)

## 2024-04-21 LAB — MAGNESIUM: Magnesium: 2 mg/dL (ref 1.7–2.4)

## 2024-04-21 LAB — LACTIC ACID, PLASMA: Lactic Acid, Venous: 1 mmol/L (ref 0.5–1.9)

## 2024-04-21 MED ORDER — PANTOPRAZOLE SODIUM 40 MG IV SOLR: 40.0000 mg | Freq: Two times a day (BID) | INTRAVENOUS | Status: DC

## 2024-04-21 MED ORDER — PIPERACILLIN-TAZOBACTAM 3.375 G IVPB
3.3750 g | Freq: Three times a day (TID) | INTRAVENOUS | Status: DC
  Administered 2024-04-21 – 2024-04-23 (×6): 3.375 g via INTRAVENOUS
  Filled 2024-04-21 (×6): qty 50

## 2024-04-21 MED ORDER — PANTOPRAZOLE 80MG IVPB - SIMPLE MED
80.0000 mg | Freq: Once | INTRAVENOUS | Status: AC
  Administered 2024-04-21: 80 mg via INTRAVENOUS
  Filled 2024-04-21: qty 80

## 2024-04-21 MED ORDER — PANTOPRAZOLE INFUSION (NEW) - SIMPLE MED
8.0000 mg/h | INTRAVENOUS | Status: DC
  Administered 2024-04-21 – 2024-04-23 (×4): 8 mg/h via INTRAVENOUS
  Filled 2024-04-21 (×3): qty 80
  Filled 2024-04-21 (×3): qty 100
  Filled 2024-04-21: qty 80

## 2024-04-21 MED ORDER — SODIUM CHLORIDE 0.9% IV SOLUTION: Freq: Once | INTRAVENOUS | Status: AC

## 2024-04-21 MED ORDER — TRAVASOL 10 % IV SOLN
INTRAVENOUS | Status: AC
  Filled 2024-04-21: qty 1224

## 2024-04-21 MED ORDER — SODIUM CHLORIDE 0.9% IV SOLUTION
Freq: Once | INTRAVENOUS | Status: DC
Start: 2024-04-21 — End: 2024-04-23

## 2024-04-21 NOTE — Plan of Care (Signed)

## 2024-04-21 NOTE — Progress Notes (Signed)
   04/21/24 1150  Spiritual Encounters  Type of Visit Initial  Care provided to: Patient  Referral source Patient request  Spiritual Framework  Presenting Themes Impactful experiences and emotions;Meaning/purpose/sources of inspiration   Per spiritual care consult request based on patient request, I visited with Mr. Scherrie Curt.  Mr. Baisley welcomed my visit. He debriefed with me some of the experiences leading up to and since his surgery on 5/12. He engaged in reflection and shared impact of experiences and ways of spiritual meaning making associated. He expressed a positive view of care, naming especially John, RN and also having spiritual care support as meaningful and supportive.  I provided compassionate presence, ofering active and reflective listening. I facilitated reflection and meaning making, affirming Mr. Fine's faith and also inviting ways to continue coping with the process of healing. I provided prayer with his consent. Will continue to follow up; Mr. Sherrin knows how to request spiritual care support as needed.  Angello Chien L. Minetta Aly, M.Div [phone]

## 2024-04-21 NOTE — Progress Notes (Signed)
 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  On 5/11 he he required resection of necrotic bowel and was left in discontinuity.  On 5/12 he returned to the OR with a small residual area of necrotic tissue removed and was reconnected.  Wound VAC in place.  Transferred back to the ICU for mechanical ventilation. Now NPO with ng tube.  Pt with a history of A-fib not on anticoagulation    PT Comments  Pt is progressing well with mobility, he ambulated 23' with RW, no loss of balance, HR 116 walking. Min assist for bed mobility and transfers.     If plan is discharge home, recommend the following: A little help with walking and/or transfers;A little help with bathing/dressing/bathroom;Assistance with cooking/housework;Assist for transportation;Help with stairs or ramp for entrance   Can travel by private vehicle     Yes  Equipment Recommendations  Rolling walker (2 wheels)    Recommendations for Other Services       Precautions / Restrictions Precautions Precautions: Other (comment) Recall of Precautions/Restrictions: Impaired Precaution/Restrictions Comments: abdominal incision with VAC; reviewed log roll Restrictions Weight Bearing Restrictions Per Provider Order: No     Mobility  Bed Mobility Overal bed mobility: Needs Assistance Bed Mobility: Rolling, Sidelying to Sit Rolling: Min assist         General bed mobility comments: VCs log roll technique, min A to raise trunk    Transfers Overall transfer level: Needs assistance Equipment used: Rolling walker (2 wheels) Transfers: Sit to/from Stand Sit to Stand: +2 safety/equipment, Min assist           General transfer comment: cues for hand placement and +2 for lines management, min A to power up    Ambulation/Gait Ambulation/Gait  assistance: Contact guard assist Gait Distance (Feet): 90 Feet Assistive device: Rolling walker (2 wheels) Gait Pattern/deviations: Step-through pattern, Decreased stride length Gait velocity: decr     General Gait Details: steady, no loss of balance, HR 116 walking, +2 to manage lines/VAC   Stairs             Wheelchair Mobility     Tilt Bed    Modified Rankin (Stroke Patients Only)       Balance Overall balance assessment: Needs assistance Sitting-balance support: Feet supported, No upper extremity supported Sitting balance-Leahy Scale: Fair     Standing balance support: Bilateral upper extremity supported, During functional activity, Reliant on assistive device for balance Standing balance-Leahy Scale: Poor                              Communication Communication Communication: No apparent difficulties  Cognition Arousal: Alert Behavior During Therapy: WFL for tasks assessed/performed   PT - Cognitive impairments: No apparent impairments                         Following commands: Intact Following commands impaired: Follows multi-step commands with increased time    Cueing Cueing Techniques: Verbal cues, Tactile cues  Exercises      General Comments        Pertinent Vitals/Pain Pain Assessment Faces Pain Scale: Hurts little more Pain Location: abdomen with movement Pain Descriptors / Indicators: Grimacing Pain Intervention(s): Monitored during session, Limited activity within patient's tolerance, Repositioned  Home Living                          Prior Function            PT Goals (current goals can now be found in the care plan section) Acute Rehab PT Goals Patient Stated Goal: likes to mow the lawn, get home to 2 dogs [employer] PT Goal Formulation: With patient Time For Goal Achievement: 04/30/24 Potential to Achieve Goals: Good Progress towards PT goals: Progressing toward goals     Frequency    Min 3X/week      PT Plan      Co-evaluation PT/OT/SLP Co-Evaluation/Treatment: Yes Reason for Co-Treatment: For patient/therapist safety;To address functional/ADL transfers PT goals addressed during session: Mobility/safety with mobility;Balance;Proper use of DME        AM-PAC PT "6 Clicks" Mobility   Outcome Measure  Help needed turning from your back to your side while in a flat bed without using bedrails?: A Little Help needed moving from lying on your back to sitting on the side of a flat bed without using bedrails?: A Little Help needed moving to and from a bed to a chair (including a wheelchair)?: A Little Help needed standing up from a chair using your arms (e.g., wheelchair or bedside chair)?: A Little Help needed to walk in hospital room?: A Little Help needed climbing 3-5 steps with a railing? : A Lot 6 Click Score: 17    End of Session Equipment Utilized During Treatment: Gait belt Activity Tolerance: Patient tolerated treatment well Patient left: in chair;with chair alarm set;with call bell/phone within reach;with family/visitor present Nurse Communication: Mobility status PT Visit Diagnosis: Difficulty in walking, not elsewhere classified (R26.2);Pain;Other abnormalities of gait and mobility (R26.89)     Time: 2841-3244 PT Time Calculation (min) (ACUTE ONLY): 25 min  Charges:    $Gait Training: 8-22 mins PT General Charges $$ ACUTE PT VISIT: 1 Visit                    [name] PT 04/21/2024  Acute Rehabilitation Services  Office [phone]

## 2024-04-21 NOTE — Progress Notes (Signed)
 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  On 5/11 he he required resection of necrotic bowel and was left in discontinuity.  On 5/12 he returned to the OR with a small residual area of necrotic tissue removed and was reconnected.  Wound VAC in place.  Transferred back to the ICU for mechanical ventilation. Now NPO with ng tube.  Pt with a history of A-fib not on anticoagulation   OT comments  Patient seen for skilled OT session this afternoon. Patient requires + 2 assistance due to complexity of medical lines and stability. Patient continues to make gains with basic mobilily this session with assisted amb into hallway with recliner follow (see PT for distances) and tolerated simple BADL's seated level with increased activity tolerance and strength gains noted. Patient still NPO with NG tube in place this session. Patient will benefit from intensive inpatient follow-up therapy, >3 hours/day if gains continue. OT will continue to follow to progress function and allow for discharge.        If plan is discharge home, recommend the following:  Two people to help with walking and/or transfers;Two people to help with bathing/dressing/bathroom;Assistance with cooking/housework;Assistance with feeding;Direct supervision/assist for medications management;Direct supervision/assist for financial management;Assist for transportation;Help with stairs or ramp for entrance;Supervision due to cognitive status   Equipment Recommendations  Other (comment) TBD after rehab venue       Precautions / Restrictions Precautions Precautions: Other (comment) Recall of Precautions/Restrictions: Impaired Precaution/Restrictions Comments: abdominal incision with VAC; reviewed log roll Restrictions Weight Bearing Restrictions Per  Provider Order: No       Mobility Bed Mobility Overal bed mobility: Needs Assistance Bed Mobility: Rolling, Sidelying to Sit Rolling: Min assist Sidelying to sit: Min assist       General bed mobility comments: VCs log roll technique    Transfers Overall transfer level: Needs assistance Equipment used: Rolling walker (2 wheels) Transfers: Bed to chair/wheelchair/BSC, Sit to/from Stand Sit to Stand: +2 safety/equipment, Min assist     Step pivot transfers: Min assist, +2 safety/equipment     General transfer comment: cues for hand placement and +2 for lines management, min A to power up     Balance Overall balance assessment: Needs assistance Sitting-balance support: Feet supported, No upper extremity supported Sitting balance-Leahy Scale: Fair     Standing balance support: Bilateral upper extremity supported, During functional activity, Reliant on assistive device for balance Standing balance-Leahy Scale: Poor                             ADL either performed or assessed with clinical judgement   ADL Overall ADL's : Needs assistance/impaired Eating/Feeding: NPO   Grooming: Wash/dry hands;Wash/dry face;Brushing hair;Set up;Sitting;Cueing for sequencing               Lower Body Dressing: Maximal assistance;Sit to/from stand               Functional mobility during ADLs: +2 for physical assistance;+2 for safety/equipment;Minimal assistance General ADL Comments: simple recliner and STS level BADL's assessed, patient able to figure 4 75% of way to reach socks Bly    Extremity/Trunk Assessment Upper Extremity Assessment Upper Extremity Assessment: Generalized weakness   Lower Extremity Assessment Lower Extremity Assessment: Defer to PT evaluation  Communication Communication Communication: No apparent difficulties   Cognition Arousal: Alert Behavior During Therapy: WFL for tasks assessed/performed Cognition: Cognition  impaired   Orientation impairments: Time     Attention impairment (select first level of impairment): Sustained attention   OT - Cognition Comments: attention improving as well as processing this visit                 Following commands: Impaired Following commands impaired: Follows one step commands with increased time      Cueing   Cueing Techniques: Verbal cues, Tactile cues        General Comments HR remained between 116-122 with RW amb into hallway with recliner follow and + 2 support for management of IJ line, wound vac and multiple IV's    Pertinent Vitals/ Pain       Pain Assessment Pain Score: 5  Pain Location: abdomen Pain Descriptors / Indicators: Sore Pain Intervention(s): Monitored during session   Frequency  Min 2X/week        Progress Toward Goals  OT Goals(current goals can now be found in the care plan section)  Progress towards OT goals: Progressing toward goals  Acute Rehab OT Goals Patient Stated Goal: to walk farther OT Goal Formulation: With patient Time For Goal Achievement: 05/02/24 Potential to Achieve Goals: Good ADL Goals Pt Will Perform Grooming: with supervision;sitting Pt Will Perform Lower Body Bathing: with contact guard assist;sitting/lateral leans;sit to/from stand;with adaptive equipment Pt Will Perform Lower Body Dressing: with contact guard assist;with adaptive equipment;sitting/lateral leans;sit to/from stand Pt Will Transfer to Toilet: with min assist;ambulating;bedside commode;grab bars;regular height toilet Pt Will Perform Toileting - Clothing Manipulation and hygiene: with contact guard assist;sitting/lateral leans;sit to/from stand Pt Will Perform Tub/Shower Transfer: with contact guard assist;shower seat;rolling walker Pt/caregiver will Perform Home Exercise Program: Increased strength;Both right and left upper extremity;Independently;With written HEP provided Additional ADL Goal #1: patient to demonstrate good  safety awareness by answering 4/4 safety questions from the [facility] correctly: 1. What do you do for yourself if you are sick with a cold? 2. What do you do if you burn yourself and the wound becomes infected? 3. What do you do if you experience severe chest pain and shortness of breath? 4. What number do you call in an emergency? In order to determine best discharge plan  Plan      Co-evaluation      Reason for Co-Treatment: For patient/therapist safety;To address functional/ADL transfers PT goals addressed during session: Mobility/safety with mobility;Balance;Proper use of DME        AM-PAC OT "6 Clicks" Daily Activity     Outcome Measure   Help from another person eating meals?: Total Help from another person taking care of personal grooming?: A Little Help from another person toileting, which includes using toliet, bedpan, or urinal?: Total Help from another person bathing (including washing, rinsing, drying)?: A Lot Help from another person to put on and taking off regular upper body clothing?: A Lot Help from another person to put on and taking off regular lower body clothing?: A Lot 6 Click Score: 11    End of Session Equipment Utilized During Treatment: Gait belt;Rolling walker (2 wheels)  OT Visit Diagnosis: Unsteadiness on feet (R26.81);Muscle weakness (generalized) (M62.81);Feeding difficulties (R63.3);Cognitive communication deficit (R41.841)   Activity Tolerance Patient limited by fatigue   Patient Left in chair;with call bell/phone within reach;with chair alarm set;with family/visitor present   Nurse Communication Mobility status        Time: 1455-1520 OT  Time Calculation (min): 25 min  Charges: OT General Charges $OT Visit: 1 Visit OT Treatments $Therapeutic Activity: 23-37 mins  Donald Morales OT/L Acute Rehabilitation Department  [phone]  04/21/2024, 4:21 PM

## 2024-04-21 NOTE — Progress Notes (Signed)
 04/21/2024  Donald Morales [id number] [birth year]  CARE TEAM: PCP: Rae Bugler, MD  Outpatient Care Team: Patient Care Team: Rae Bugler, MD as PCP - General (Family Medicine) Mealor, Donnamae Gaba, MD as PCP - Electrophysiology (Cardiology) Dorsey Gault, MD as Consulting Physician (Cardiology)  Inpatient Treatment Team: Treatment Team:  Hoyt Macleod, MD Ccs, Md, MD Eliot Guernsey, MD Lbcardiology, Zola Hint, MD Layvonne Primas, [place] Lelon Putty, RN Luise Saint, [facility] Trenda Frisk, RN Merrill Abide, OT May, Serita Danes, RN Ambrose Bailer, PT Loreda Rodriguez, RN Hendra, Melinda Sprawls, [place]   Problem List:   Principal Problem:   Ischemic bowel disease [facility])   04/14/2024  Preoperative diagnosis: 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   Postoperative diagnosis: Same   Procedure: Exploratory laparotomy with resection of approximately 40 cm of ileum with primary anastomosis and closure of abdomen and placement of subcutaneous wound VAC   Surgeon: Sim Dryer, MD   Assistant: Berkeley Breath, PA-C   04/13/2024  Surgeons and Role: Edmon Gosling, MD - Primary   Preoperative Diagnosis: Bowel ischemia   Postoperative Diagnosis: small bowel ischemia   Procedure(s) Performed:   - Foley catheter placement - Diagnostic laparoscopy - Exploratory laparotomy - Small bowel resection    Assessment [facility] Stay = 8 days) 7 Days Post-Op    SMV thrombosis and Small bowel ischemia   POD8 ex-lap, small bowel resection, open abdominal VAC Dr. Ramiro Burly  POD7 ex-lap, resection 40 cm ileum with primary anastomosis and closure of abdomen Dr. Afton Horse  Ileus inconsistently resolving.    Plan:  - NG removed, with recurrent nausea and vomiting.  NG tube replaced. - Continue low intermittent wall suction for now.  If has lowered output and with improvement can consider NGT clamping trials tomorrow.  If not  improved with high output, can consider CT scan to rule out abscess or other intra-abdominal issue that may be prolonging his ileus.  -He is convinced if he just ate solid food he would be fine.  I noted that would backfire and recommend he not do that. - continue TPN to avoid malnutrition. - mobilize as tolerated  - VAC changes Mon Thu -Anticoagulation to avoid repeat embolization per main service.  With dropping hemoglobin I believe they are holding and transfusing.   FEN: NPO, TPN, NGT VTE: hep gtt ID: [email or web address]  5/10>5/16   - per CCM -  HFrEF HTN Thyroid disease A. Fib not on anticoagulation  I updated the patient's status to the patient and nurse  Recommendations were made.  Questions were answered.  They expressed understanding & appreciation.        I reviewed nursing notes, Consultant Cardiology notes, hospitalist notes, last 24 h vitals and pain scores, last 48 h intake and output, last 24 h labs and trends, and last 24 h imaging results.  I have reviewed this patient's available data, including medical history, events of note, test results, etc as part of my evaluation.   A significant portion of that time was spent in counseling. Care during the described time interval was provided by me.  This care required moderate level of medical decision making.  04/21/2024    Subjective: (Chief complaint)  ICU nursing room.  Patient hungry wants to eat solid food.  NG tube initially appeared thicker.  Staff worried if it is feculent versus old blood but now just looks like dark green.  Patient claims he is had bowel movements.  Denies much abdominal pain.  Objective:  Vital signs:  Vitals:   04/21/24 0500 04/21/24 0600 04/21/24 0700 04/21/24 0800  BP: 136/86 121/71 117/65 109/72  Pulse: 96 98 81 84  Resp: 19 19 15 14   Temp:  97.7 F (36.5 C)    TempSrc:  Axillary    SpO2: 99% 98% 97% 98%  Weight:      Height:        Last BM Date :  (PTA)  Intake/Output    Yesterday:  05/18 0701 - 05/19 0700 In: 613.5 [I.V.:613.5] Out: 3625 [Urine:1175; Emesis/NG output:2450] This shift:  No intake/output data recorded.  Bowel function:  Flatus: YES  BM: Not documented but patient claims he had 1.  Drain: Nasogastric tube with dark green mildly thick effluent in canister   Physical Exam:  General: Pt awake/alert in no acute distress Eyes: PERRL, normal EOM.  Sclera clear.  No icterus Neuro: CN II-XII intact w/o focal sensory/motor deficits. Lymph: No head/neck/groin lymphadenopathy Psych:  No delerium/psychosis/paranoia.  Oriented x 4 HENT: Normocephalic, Mucus membranes moist.  No thrush Neck: Supple, No tracheal deviation.  No obvious thyromegaly Chest: No pain to chest wall compression.  Good respiratory excursion.  No audible wheezing CV:  Pulses intact.  Regular rhythm.  No major extremity edema MS: Normal AROM mjr joints.  No obvious deformity  Abdomen: Soft.  Mildy distended.  Mildly tender at incisions only.  Midline incision with black sponge wound VAC in place with serosanguineous drainage.  No evidence of peritonitis.  No incarcerated hernias.  Ext:   No deformity.  No mjr edema.  No cyanosis Skin: No petechiae / purpurea.  No major sores.  Warm and dry    Results:   Cultures: Recent Results (from the past 720 hours)  MRSA Next Gen by PCR, Nasal     Status: None   Collection Time: 04/13/24  5:52 AM   Specimen: Nasal Mucosa; Nasal Swab  Result Value Ref Range Status   MRSA by PCR Next Gen NOT DETECTED NOT DETECTED Final    Comment: (NOTE) The GeneXpert MRSA Assay (FDA approved for NASAL specimens only), is one component of a comprehensive MRSA colonization surveillance program. It is not intended to diagnose MRSA infection nor to guide or monitor treatment for MRSA infections. Test performance is not FDA approved in patients less than 107 years old. Performed at [facility], [address] [place], [place] [address]   MRSA Next Gen by PCR, Nasal     Status: None   Collection Time: 04/15/24 10:04 AM   Specimen: Nasal Mucosa; Nasal Swab  Result Value Ref Range Status   MRSA by PCR Next Gen NOT DETECTED NOT DETECTED Final    Comment: (NOTE) The GeneXpert MRSA Assay (FDA approved for NASAL specimens only), is one component of a comprehensive MRSA colonization surveillance program. It is not intended to diagnose MRSA infection nor to guide or monitor treatment for MRSA infections. Test performance is not FDA approved in patients less than 84 years old. Performed at [facility], [address], [place], [place] [address]     Labs: Results for orders placed or performed during the hospital encounter of 04/12/24 (from the past 48 hours)  Glucose, capillary     Status: Abnormal   Collection Time: 04/19/24 12:02 PM  Result Value Ref Range   Glucose-Capillary 158 (H) 70 - 99 mg/dL    Comment: Glucose reference range applies only to samples taken after fasting  for at least 8 hours.   Comment 1 Notify RN    Comment 2 Document in Chart   Glucose, capillary     Status: Abnormal   Collection Time: 04/19/24  3:43 PM  Result Value Ref Range   Glucose-Capillary 139 (H) 70 - 99 mg/dL    Comment: Glucose reference range applies only to samples taken after fasting for at least 8 hours.   Comment 1 Notify RN    Comment 2 Document in Chart   Glucose, capillary     Status: Abnormal   Collection Time: 04/19/24  7:53 PM  Result Value Ref Range   Glucose-Capillary 164 (H) 70 - 99 mg/dL    Comment: Glucose reference range applies only to samples taken after fasting for at least 8 hours.  Glucose, capillary     Status: Abnormal   Collection Time: 04/19/24 11:46 PM  Result Value Ref Range   Glucose-Capillary 160 (H) 70 - 99 mg/dL    Comment: Glucose reference range applies only to samples taken after fasting for at least 8 hours.  Glucose, capillary     Status:  Abnormal   Collection Time: 04/20/24  3:25 AM  Result Value Ref Range   Glucose-Capillary 147 (H) 70 - 99 mg/dL    Comment: Glucose reference range applies only to samples taken after fasting for at least 8 hours.  Heparin  level (unfractionated)     Status: Abnormal   Collection Time: 04/20/24  3:55 AM  Result Value Ref Range   Heparin  Unfractionated 0.81 (H) 0.30 - 0.70 IU/mL    Comment: (NOTE) The clinical reportable range upper limit is being lowered to >1.10 to align with the FDA approved guidance for the current laboratory assay.  If heparin  results are below expected values, and patient dosage has  been confirmed, suggest follow up testing of antithrombin III levels. Performed at [facility], [address] [place], [place] [address]   Basic metabolic panel with GFR     Status: Abnormal   Collection Time: 04/20/24  3:55 AM  Result Value Ref Range   Sodium 134 (L) 135 - 145 mmol/L   Potassium 4.4 3.5 - 5.1 mmol/L   Chloride 103 98 - 111 mmol/L   CO2 23 22 - 32 mmol/L   Glucose, Bld 146 (H) 70 - 99 mg/dL    Comment: Glucose reference range applies only to samples taken after fasting for at least 8 hours.   BUN 35 (H) 8 - 23 mg/dL   Creatinine, Ser 4.25 0.61 - 1.24 mg/dL   Calcium 8.4 (L) 8.9 - 10.3 mg/dL   GFR, Estimated >95 >63 mL/min    Comment: (NOTE) Calculated using the CKD-EPI Creatinine Equation (2021)    Anion gap 8 5 - 15    Comment: Performed at [facility], [address] [place], [place] [address]  Magnesium      Status: None   Collection Time: 04/20/24  3:55 AM  Result Value Ref Range   Magnesium  2.1 1.7 - 2.4 mg/dL    Comment: Performed at [facility], [address] [place], [place] [address]  Phosphorus     Status: None   Collection Time: 04/20/24  3:55 AM  Result Value Ref Range   Phosphorus 3.3 2.5 - 4.6 mg/dL    Comment: Performed at [facility], [address],  [place], [place] [address]  Glucose, capillary     Status: Abnormal   Collection Time: 04/20/24  7:28 AM  Result Value Ref Range   Glucose-Capillary 153 (H) 70 - 99 mg/dL    Comment: Glucose reference range applies only to samples taken after fasting for at least 8 hours.   Comment 1 Notify RN   Glucose, capillary     Status: Abnormal   Collection Time: 04/20/24 11:42 AM  Result Value Ref Range   Glucose-Capillary 148 (H) 70 - 99 mg/dL    Comment: Glucose reference range applies only to samples taken after fasting for at least 8 hours.   Comment 1 Notify RN   Heparin  level (unfractionated)     Status: None   Collection Time: 04/20/24 12:10 PM  Result Value Ref Range   Heparin  Unfractionated 0.59 0.30 - 0.70 IU/mL    Comment: (NOTE) The clinical reportable range upper limit is being lowered to >1.10 to align with the FDA approved guidance for the current laboratory assay.  If heparin  results are below expected values, and patient dosage has  been confirmed, suggest follow up testing of antithrombin III levels. Performed at [facility], [address], [place], [place] [address]   Glucose, capillary     Status: Abnormal   Collection Time: 04/20/24  3:18 PM  Result Value Ref Range   Glucose-Capillary 139 (H) 70 - 99 mg/dL    Comment: Glucose reference range applies only to samples taken after fasting for at least 8 hours.   Comment 1 Notify RN   Glucose, capillary     Status: Abnormal   Collection Time: 04/20/24  8:23 PM  Result Value Ref Range   Glucose-Capillary 147 (H) 70 - 99 mg/dL    Comment: Glucose reference range applies only to samples taken after fasting for at least 8 hours.   Comment 1 Notify RN    Comment 2 Document in Chart   Glucose, capillary     Status: Abnormal   Collection Time: 04/20/24 11:59 PM  Result Value Ref Range   Glucose-Capillary 166 (H) 70 - 99 mg/dL    Comment: Glucose reference range applies only to samples taken after fasting for  at least 8 hours.   Comment 1 Notify RN    Comment 2 Document in Chart   Comprehensive metabolic panel     Status: Abnormal   Collection Time: 04/21/24  2:42 AM  Result Value Ref Range   Sodium 136 135 - 145 mmol/L   Potassium 4.2 3.5 - 5.1 mmol/L   Chloride 104 98 - 111 mmol/L   CO2 24 22 - 32 mmol/L   Glucose, Bld 164 (H) 70 - 99 mg/dL    Comment: Glucose reference range applies only to samples taken after fasting for at least 8 hours.   BUN 38 (H) 8 - 23 mg/dL   Creatinine, Ser 1.91 0.61 - 1.24 mg/dL   Calcium 8.1 (L) 8.9 - 10.3 mg/dL   Total Protein 5.8 (L) 6.5 - 8.1 g/dL   Albumin  2.3 (L) 3.5 - 5.0 g/dL   AST 29 15 - 41 U/L   ALT 57 (H) 0 - 44 U/L   Alkaline Phosphatase 63 38 - 126 U/L   Total Bilirubin 2.0 (H) 0.0 - 1.2 mg/dL   GFR, Estimated >47 >82 mL/min    Comment: (NOTE) Calculated using the CKD-EPI Creatinine Equation (2021)    Anion gap 8 5 - 15    Comment: Performed at [facility], [address], [place], [place] [address]  Magnesium      Status: None  Collection Time: 04/21/24  2:42 AM  Result Value Ref Range   Magnesium  2.0 1.7 - 2.4 mg/dL    Comment: Performed at [facility], [address] [place], [place] [address]  Phosphorus     Status: None   Collection Time: 04/21/24  2:42 AM  Result Value Ref Range   Phosphorus 3.3 2.5 - 4.6 mg/dL    Comment: Performed at [facility], [address] [place], [place] [address]  Triglycerides     Status: None   Collection Time: 04/21/24  2:42 AM  Result Value Ref Range   Triglycerides 54 <150 mg/dL    Comment: Performed at [facility], [address], [place], [place] [address]  CBC     Status: Abnormal   Collection Time: 04/21/24  2:42 AM  Result Value Ref Range   WBC 18.4 (H) 4.0 - 10.5 K/uL   RBC 3.13 (L) 4.22 - 5.81 MIL/uL   Hemoglobin 7.0 (L) 13.0 - 17.0 g/dL    Comment: REPEATED TO VERIFY DELTA CHECK NOTED    HCT 23.9 (L)  39.0 - 52.0 %   MCV 76.4 (L) 80.0 - 100.0 fL   MCH 22.4 (L) 26.0 - 34.0 pg   MCHC 29.3 (L) 30.0 - 36.0 g/dL   RDW 91.4 (H) 78.2 - 95.6 %   Platelets 190 150 - 400 K/uL   nRBC 0.3 (H) 0.0 - 0.2 %    Comment: Performed at [facility], [address] [place], [place] [address]  Heparin  level (unfractionated)     Status: None   Collection Time: 04/21/24  2:42 AM  Result Value Ref Range   Heparin  Unfractionated 0.58 0.30 - 0.70 IU/mL    Comment: (NOTE) The clinical reportable range upper limit is being lowered to >1.10 to align with the FDA approved guidance for the current laboratory assay.  If heparin  results are below expected values, and patient dosage has  been confirmed, suggest follow up testing of antithrombin III levels. Performed at [facility], [address], [place], [place] [address]   Glucose, capillary     Status: Abnormal   Collection Time: 04/21/24  4:47 AM  Result Value Ref Range   Glucose-Capillary 137 (H) 70 - 99 mg/dL    Comment: Glucose reference range applies only to samples taken after fasting for at least 8 hours.  Type and screen [facility]     Status: None (Preliminary result)   Collection Time: 04/21/24  5:00 AM  Result Value Ref Range   ABO/RH(D) O NEG    Antibody Screen NEG    Sample Expiration 04/24/2024,2359    Unit Number [id number]    Blood Component Type RED CELLS,LR    Unit division 00    Status of Unit ISSUED    Transfusion Status OK TO TRANSFUSE    Crossmatch Result      Compatible Performed at [facility], [address], [place], [place] [address]   Prepare RBC (crossmatch)     Status: None   Collection Time: 04/21/24  5:00 AM  Result Value Ref Range   Order Confirmation      ORDER PROCESSED BY BLOOD BANK Performed at [facility], [address], [place], [place] [address]   Glucose, capillary     Status: Abnormal   Collection  Time: 04/21/24  7:40 AM  Result Value Ref Range   Glucose-Capillary 114 (H) 70 - 99  mg/dL    Comment: Glucose reference range applies only to samples taken after fasting for at least 8 hours.   Comment 1 Notify RN    Comment 2 Document in Chart     Imaging / Studies: DG Abd 1 View Result Date: 04/20/2024 CLINICAL DATA:  Nasogastric tube placement. EXAM: ABDOMEN - 1 VIEW COMPARISON:  Apr 16, 2024. FINDINGS: Nasogastric tube tip seen in expected position of distal stomach or proximal duodenum. No abnormal bowel dilatation. IMPRESSION: Nasogastric tube tip seen in expected position of distal stomach or proximal duodenum. Electronically Signed   By: Rosalene Colon M.D.   On: 04/20/2024 10:13    Medications / Allergies: per chart  Antibiotics: Anti-infectives (From admission, onward)    Start     Dose/Rate Route Frequency Ordered Stop   04/14/24 1000  vancomycin  (VANCOREADY) IVPB 1500 mg/300 mL  Status:  Discontinued        1,500 mg 150 mL/hr over 120 Minutes Intravenous Every 24 hours 04/13/24 0731 04/15/24 1416   04/14/24 0700  vancomycin  (VANCOREADY) IVPB 1750 mg/350 mL  Status:  Discontinued        1,750 mg 175 mL/hr over 120 Minutes Intravenous Every 24 hours 04/13/24 0547 04/13/24 0731   04/13/24 0615  vancomycin  (VANCOREADY) IVPB 1750 mg/350 mL        1,750 mg 175 mL/hr over 120 Minutes Intravenous  Once 04/13/24 0525 04/13/24 0811   04/13/24 0400  piperacillin -tazobactam (ZOSYN ) IVPB 3.375 g  Status:  Discontinued        3.375 g 100 mL/hr over 30 Minutes Intravenous Every 6 hours 04/13/24 0240 04/13/24 0244   04/13/24 0400  piperacillin -tazobactam (ZOSYN ) IVPB 3.375 g  Status:  Discontinued        3.375 g 12.5 mL/hr over 240 Minutes Intravenous Every 8 hours 04/13/24 0245 04/18/24 0936   04/12/24 2245  piperacillin -tazobactam (ZOSYN ) IVPB 3.375 g        3.375 g 100 mL/hr over 30 Minutes Intravenous  Once 04/12/24 2237 04/12/24 2333         Note: Portions of this report  may have been transcribed using voice recognition software. Every effort was made to ensure accuracy; however, inadvertent computerized transcription errors may be present.   Any transcriptional errors that result from this process are unintentional.    Eddye Goodie, MD, FACS, MASCRS Esophageal, Gastrointestinal & Colorectal Surgery Robotic and Minimally Invasive Surgery  [facility] [address], [address] [place], [place] [address] [phone] Fax [phone] Main  CONTACT INFORMATION: Weekday (9AM-5PM): Call [facility] main office at [phone] Weeknight (5PM-9AM) or Weekend/Holiday: Check EPIC "Web Links" tab & use "AMION" (password " [email or web address]") for General Surgery [facility] coverage  Please, DO NOT use SecureChat  (it is not reliable communication to reach operating surgeons & will lead to a delay in care).   Epic staff messaging available for outptient concerns needing 1-2 business day response.      04/21/2024  8:50 AM

## 2024-04-21 NOTE — Progress Notes (Signed)
 Nutrition Follow-up  DOCUMENTATION CODES:   Obesity unspecified  INTERVENTION:  - Plan to continue goal TPN.             - TPN management per pharmacy.    - Daily weights while on TPN.   - Will monitor for further diet advancement. - Recommend Boost Breeze po TID once medically appropriate.   NUTRITION DIAGNOSIS:   Inadequate oral intake related to inability to eat as evidenced by NPO status. *ongoing  GOAL:   Patient will meet greater than or equal to 90% of their needs *met with TPN  MONITOR:   Vent status, Labs, Weight trends  REASON FOR ASSESSMENT:   Ventilator    ASSESSMENT:   68 y.o. male with a PMH of HTN, thyroid disease, A-fib who presented with abdominal pain. Admitted for small bowel ischemia with septic shock.  5/11 Admit; taken to OR for bowel ischemia, s/p laparoscopic abdominal surgery converted to open for necrotic small bowel - 40cm small bowel resected and notable mesenteric vein thromboses; Left in discontinuity 5/12 Taken back to OR with [facility] - ex-lap with another resection of ~40cm of ileum with primary anastomosis and closure of abdomen with wound VAC 5/13 Starting TPN; Extubated 5/17 NGT removed, CLD, patient began vomiting in the evening 5/18 Continued vomiting, NGT placed for LIS    Patient's diet advanced on 5/17 to clear liquids after NGT was removed. However, patient had several episodes of vomiting and NGT had to be placed again. Had 2.3L out yesterday. Passing gas but no BM yet.  TPN remains at goal of 16mL/h0r per pharmacy, providing 1871 kcals and 122g protein.   Patient noted to have had possible weight loss since admission. Will increase estimated needs. TPN already ordered for today, would recommend increasing tomorrow if still unable to take any PO.     Admit weight: 209# Current weight: 187# I&O's: -1.7L since admit   Medications reviewed and include: Lasix    Labs reviewed:  HA1C 6.3 Blood Glucose 114-166 x 24  hours Triglycerides 54 (as of 5/19)     Diet Order:   Diet Order             Diet clear liquid Room service appropriate? Yes; Fluid consistency: Thin  Diet effective now                   EDUCATION NEEDS:  Not appropriate for education at this time  Skin:  Skin Assessment: Skin Integrity Issues: Skin Integrity Issues:: Incisions Incisions: Abdomen  Last BM:  PTA  Height:  Ht Readings from Last 1 Encounters:  04/18/24 5\' 9"  (1.753 m)   Weight:  Wt Readings from Last 1 Encounters:  04/21/24 84.9 kg   Ideal Body Weight:  72.73 kg  BMI:  Body mass index is 27.64 kg/m.  Estimated Nutritional Needs:  Kcal:  2000-2250 kcals Protein:  110-125 grams Fluid:  >/= 2L    Scheryl Cushing RD, LDN Contact via Secure Chat.

## 2024-04-21 NOTE — Progress Notes (Signed)
 PHARMACY - TOTAL PARENTERAL NUTRITION CONSULT NOTE   Indication: bowel obstruction  Patient Measurements: Height: 5\' 9"  (175.3 cm) Weight: 84.9 kg (187 lb 2.7 oz) IBW/kg (Calculated) : 70.7 TPN AdjBW (KG): 75.2 Body mass index is 27.64 kg/m. Usual Weight:   Assessment:  38 yoM admitted on 5/11 with abdominal pain and AFib with RVR and concern of SMV thrombosis.  S/p small bowel resection x2 with anastomosis and abdominal wall closure on 5/12.  Pharmacy is consulted to dose TPN.  Glucose / Insulin : No hx DM noted.  A1c 6.3.   - 5/10 Dexamethasone  10mg , 5/12 Dexamethasone  5mg  - CBGs < 180 (range 136-160) on mSSI (14 units/ 24 hrs) Electrolytes: Elytes WNL  - Lasix  dose decreased to 40 mg IV daily  Renal: SCr WNL, BUN 38 up Hepatic: LFTs continue to trend down, Tbili 2, trending up - TG 54 (5/18) Intake / Output; MIVF:  - Intake: no mIVF.  Lasix  IV x1 on 5/14 - Output: UOP 925 mL, NG 2250 mL GI Imaging: GI Surgeries / Procedures:  - 5/11 OR:  lap > open for necrotic small bowel w/ 40cm small bowel resected and notable mesenteric vein thromboses. Left in discontinuity. - 5/12 OR: 2nd look, 40 cm small bowel resection, anastomosis, abdominal wall closure  Central access: CVC 5/11 TPN start date: 5/13  Nutritional Goals: Goal TPN rate is 85 mL/hr and provides 122 g of protein and 1871 kcals per day.   RD Assessment: ( 5/16)  Estimated Needs Total Energy Estimated Needs: 1800-2200 kcals Total Protein Estimated Needs: 110-125 grams Total Fluid Estimated Needs: >/= 1.8L  Current Nutrition:  CL  Plan: Continue TPN at 85 mL/hr at 1800, the target rate  Electrolytes in TPN:  Na 120 mEq/L K  75 mEq/L Ca 8 mEq/L Mg 11mEq/L  Phos 20 mmol/L Cl:Ac 1:1 Add standard MVI and trace elements to TPN Continue moderate SSI and adjust as needed  No IVF per MD. Monitor TPN labs on Mon/Thurs and prn   Luise Saint, PharmD, BCPS Secure Chat if ?s 04/21/2024 8:53 AM

## 2024-04-21 NOTE — TOC Progression Note (Signed)
 Transition of Care [facility]) - Progression Note    Patient Details  Name: Donald Morales MRN: [id number] Date of Birth: 12/08/1955  [facility] CM/SW Contact  Jonni Nettle, LCSW Phone Number: 04/21/2024, 2:20 PM  Clinical Narrative:    CSW spoke with MD to discuss PT's recommendation for SNF versus potential LTAC referral. Per MD, pt does not meet criteria for LTAC. MD recommends having PT re-evaluate in the next few days prior to making decision. CSW will await PT re-evaluation. TOC will continue to follow.   Expected Discharge Plan: Home/Self Care Barriers to Discharge: Continued Medical Work up  Expected Discharge Plan and Services SNF or home with [facility] services, pending PT re-evaluation   Social Determinants of Health (SDOH) Interventions SDOH Screenings   Food Insecurity: Patient Unable To Answer (04/13/2024)  Housing: Patient Unable To Answer (04/13/2024)  Transportation Needs: Patient Unable To Answer (04/13/2024)  Utilities: Patient Unable To Answer (04/13/2024)  Social Connections: Patient Unable To Answer (04/13/2024)  Tobacco Use: Low Risk  (04/12/2024)    Readmission Risk Interventions    04/16/2024   11:56 AM  Readmission Risk Prevention Plan  Transportation Screening Complete  PCP or Specialist Appt within 5-7 Days Complete  Home Care Screening Complete  Medication Review (RN CM) Complete    [place], MSW, LCSW 04/21/2024 2:24 PM

## 2024-04-21 NOTE — Progress Notes (Addendum)
 PROGRESS NOTE  Donald Morales  DOB: [birth year]  PCP: Rae Bugler, MD VWU:[id number]  DOA: 04/12/2024  LOS: 8 days  Hospital Day: 10  Brief narrative: Donald Morales is a 68 y.o. adult with PMH significant for HTN, HLD, atrial flutter not on anticoagulation, hypothyroidism, BPH. 5/11, patient presented to the ED with progressive abdominal pain for few weeks worse on last few days.  Last BM was 5 days prior to presentation.  CT abdomen showed bowel wall thickening and also raised concern for SMV thrombosis.  Labs showed WC count of 12.1 and lactic acid elevated 2.8 and trended up to 4.9  Patient was seen by general surgery and taken to the OR emergently. Underwent diagnostic laparoscopy, converted to open.  Underwent resection of 40 cm of necrotic small bowel.  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 Left in discontinuity to return to OR in 24-48hrs.  Patient was admitted to ICU postop.  Required 20 phenylephrine  drip. 5/12, taken to OR for further bowel resection and closure of abdomen.  Postop, patient had Afib with RVR required amiodarone .  Eventually weaned off pressors, started on TPN,anticoagulation with IV heparin  drip.  5/14, extubated  5/16, transferred out to [facility]  Subjective: Patient was seen and examined this morning.   Remains sick.  Had NG tube reinserted yesterday.  Large volume dark gastric content in canister. Passing gas but no bowel movement. Hemoglobin dropped.  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 At high risk of infection.  I decided to resume Zosyn  IV.  If he starts to mount fever or WBC continues to rise, may need to think of fungemia as well with TPN Continue IV  fentanyl  as needed for pain control Initially required vasopressors postop.  Weaned off. Hemodynamically improving now Recent Labs  Lab 04/16/24 0530 04/17/24 0510 04/18/24 0529 04/19/24 0555 04/21/24 0242 04/21/24 0905  WBC 10.4 10.3 9.0 11.4* 18.4*  --   LATICACIDVEN  --   --   --  1.0  --  1.0   Postop ileus Had NG tube reinserted yesterday.  Large volume dark gastric content in canister. Passing gas but no bowel movement. Remains on TPN.  Acute blood loss anemia Suspect upper GI bleeding Patient had some surgical blood loss but hemoglobin was stable level 9.  In the last 24 hours, hemoglobin has significantly dropped from 9.5-7.  He had large-volume drainage in NG tube. Heparin  drip held.  1 unit of PRBC transfusion ordered. Started on Protonix  drip. Recent Labs    04/16/24 0530 04/17/24 0510 04/18/24 0529 04/19/24 0555 04/21/24 0242  HGB 9.1* 9.9* 10.1* 9.5* 7.0*  MCV 75.4* 75.5* 75.4* 75.5* 76.4*   Hypokalemia/hypophosphatemia Secondary to GI loss from NG tube drainage Levels improved with replacement. Recent Labs  Lab 04/17/24 0510 04/18/24 0529 04/19/24 0555 04/20/24 0355 04/21/24 0242  K 3.2* 3.7 3.9 4.4 4.2  MG 2.0 1.9 1.9 2.1 2.0  PHOS 2.3* 2.9 2.9 3.3 3.3   Hyponatremia Likely from low intake.  Continue to monitor. Recent Labs  Lab 04/15/24 0431 04/16/24 0530 04/17/24 0510 04/18/24 0529 04/19/24 0555 04/20/24 0355 04/21/24 0242  NA 138 139 135 131* 133* 134* 136   Acute worsening of chronic systolic CHF Moderate to  severe MR  Hypertension EF in 2023 with 35 to 40%.  Echo 5/11 showed EF of 30 to 35% with regional WMA and moderately reduced RV function, moderate to severe MR, moderate TR. Cardiology following.  Patient needs GDMT initiated once oral intake improves. Received 1 of IV Lasix  on 5/17.  BUN trended up.  Currently patient is already losing fluid.  Lasix  kept on hold.  A-fib with RVR Has history of A-fib and was seen by EP in  the past but did not follow-up.  Qualified for anticoagulation but did not comply with it. With RVR this hospitalization, patient was started on amiodarone  drip, digoxin  and heparin  drip. Cardiology consult appreciated. Currently continued on IV amiodarone .  Heparin  drip held because of acute bleeding.  Elevated LFTs  likely component of shock liver Gradually improving Repeat labs tomorrow Recent Labs  Lab 04/16/24 0530 04/17/24 0510 04/21/24 0242  AST 69* 58* 29  ALT 214* 183* 57*  ALKPHOS 57 62 63  BILITOT 1.1 1.6* 2.0*  PROT 5.6* 6.0* 5.8*  ALBUMIN  2.6* 2.6* 2.3*   Hyperglycemia A1c 6.3.  No prior history of diabetes Currently on SSI/Accu-Cheks Recent Labs  Lab 04/20/24 1518 04/20/24 2023 04/20/24 2359 04/21/24 0447 04/21/24 0740  GLUCAP 139* 147* 166* 137* 114*   Hx of hypothyroidism tsh 4.6 Oral Synthroid  resume once able to tolerate oral intake IV Synthroid  currently.  Acute urinary retention  BPH Patient endorses lower urinary tract symptoms but states he was afraid to go to the urologist.   5/16, started on Flomax .  Able to void.  Continue to monitor.   Mobility: PT encourage ambulation once clinically improved.  Goals of care   Code Status: Full Code     DVT prophylaxis:  SCDs Start: 04/13/24 0500   Antimicrobials: IV Zosyn  to resume Fluid: TPN Consultants: General Surgery, cardiology  Family Communication: None at bedside  Status: Inpatient Level of care:  ICU   Patient is from: Home Needs to continue in-hospital care: Postop ileus status Anticipated d/c to: Pending clinical course   Diet:  Diet Order             Diet clear liquid Room service appropriate? Yes; Fluid consistency: Thin  Diet effective now                   Scheduled Meds:  Chlorhexidine  Gluconate Cloth  6 each Topical Q0600   insulin  aspart  0-15 Units Subcutaneous Q4H   [START ON 04/25/2024] levothyroxine   25 mcg Intravenous Daily   metoprolol  tartrate  2.5  mg Intravenous Q6H   [START ON 04/24/2024] pantoprazole   40 mg Intravenous Q12H   pneumococcal 20-valent conjugate vaccine  0.5 mL Intramuscular Tomorrow-1000   scopolamine   1 patch Transdermal Q72H   sodium chloride  flush  10-40 mL Intracatheter Q12H   tamsulosin   0.4 mg Oral Daily    PRN meds: fentaNYL  (SUBLIMAZE ) injection, hydrALAZINE , ondansetron  (ZOFRAN ) IV, prochlorperazine , sodium chloride  flush   Infusions:   amiodarone  30 mg/hr (04/21/24 0900)   pantoprazole      pantoprazole      TPN ADULT (ION) 85 mL/hr at 04/21/24 0900   TPN ADULT (ION)      Antimicrobials: Anti-infectives (From admission, onward)    Start     Dose/Rate Route Frequency Ordered Stop   04/14/24 1000  vancomycin  (VANCOREADY) IVPB 1500 mg/300 mL  Status:  Discontinued        1,500 mg 150 mL/hr over 120 Minutes Intravenous Every 24 hours 04/13/24 0731 04/15/24 1416  04/14/24 0700  vancomycin  (VANCOREADY) IVPB 1750 mg/350 mL  Status:  Discontinued        1,750 mg 175 mL/hr over 120 Minutes Intravenous Every 24 hours 04/13/24 0547 04/13/24 0731   04/13/24 0615  vancomycin  (VANCOREADY) IVPB 1750 mg/350 mL        1,750 mg 175 mL/hr over 120 Minutes Intravenous  Once 04/13/24 0525 04/13/24 0811   04/13/24 0400  piperacillin -tazobactam (ZOSYN ) IVPB 3.375 g  Status:  Discontinued        3.375 g 100 mL/hr over 30 Minutes Intravenous Every 6 hours 04/13/24 0240 04/13/24 0244   04/13/24 0400  piperacillin -tazobactam (ZOSYN ) IVPB 3.375 g  Status:  Discontinued        3.375 g 12.5 mL/hr over 240 Minutes Intravenous Every 8 hours 04/13/24 0245 04/18/24 0936   04/12/24 2245  piperacillin -tazobactam (ZOSYN ) IVPB 3.375 g        3.375 g 100 mL/hr over 30 Minutes Intravenous  Once 04/12/24 2237 04/12/24 2333       Objective: Vitals:   04/21/24 0905 04/21/24 0920  BP:  127/67  Pulse: 94 90  Resp: 19 (!) 23  Temp:  98.1 F (36.7 C)  SpO2: 98%     Intake/Output Summary (Last 24 hours) at 04/21/2024 1034 Last  data filed at 04/21/2024 0900 Gross per 24 hour  Intake 2014.17 ml  Output 3425 ml  Net -1410.83 ml   Filed Weights   04/19/24 0428 04/20/24 0500 04/21/24 0349  Weight: 88.3 kg 89 kg 84.9 kg   Weight change: -4.1 kg Body mass index is 27.64 kg/m.   Physical Exam: General exam: Pleasant, elderly Caucasian male.  NG tube in place with greenish content in canister Skin: No rashes, lesions or ulcers. HEENT: Atraumatic, normocephalic, no obvious bleeding Lungs: Clear to auscultation bilaterally,  CVS: S1, S2, faint pansystolic murmur GI/Abd: Soft, appropriate postop tenderness, nondistended, bowel sound very sluggish,   CNS: Slow to respond but alert, awake, oriented x 3 Psychiatry: Sad affect Extremities: No pedal edema, no calf tenderness  Data Review: I have personally reviewed the laboratory data and studies available.  F/u labs  Unresulted Labs (From admission, onward)     Start     Ordered   04/22/24 0500  Comprehensive metabolic panel  Tomorrow morning,   R       Question:  Specimen collection method  Answer:  Unit=Unit collect   04/21/24 0855   04/22/24 0500  Magnesium   Tomorrow morning,   R       Question:  Specimen collection method  Answer:  Unit=Unit collect   04/21/24 0855   04/22/24 0500  Phosphorus  Tomorrow morning,   R       Question:  Specimen collection method  Answer:  Unit=Unit collect   04/21/24 0855   04/21/24 0500  Triglycerides  (TPN Lab Panel)  Every Monday (0500),   R     Question:  Specimen collection method  Answer:  Unit=Unit collect   04/15/24 1416   04/21/24 0500  CBC  Daily,   R     Question:  Specimen collection method  Answer:  Unit=Unit collect   04/20/24 0426   04/17/24 0500  Comprehensive metabolic panel  (TPN Lab Panel)  Every Mon,Thu (0500),   R     Question:  Specimen collection method  Answer:  Unit=Unit collect   04/15/24 1416   04/17/24 0500  Magnesium   (TPN Lab Panel)  Every Mon,Thu (0500),   R  Question:  Specimen collection  method  Answer:  Unit=Unit collect   04/15/24 1416   04/17/24 0500  Phosphorus  (TPN Lab Panel)  Every Mon,Thu (0500),   R     Question:  Specimen collection method  Answer:  Unit=Unit collect   04/15/24 1416   04/14/24 0500  Creatinine, serum  Daily,   R      04/13/24 0545           Signed, Hoyt Macleod, MD [facility] 04/21/2024

## 2024-04-21 NOTE — Progress Notes (Addendum)
 Patient Name: Donald Morales Date of Encounter: 04/21/2024 [facility] Cardiologist: None   Interval Summary  .    Hemoglobin has dropped today to 7 down from 9.5.  Currently getting 1 unit of blood.  He offers no complaints no chest pain, shortness of breath, orthopnea.  NG tube continues to suck out very dark material although no obvious signs of bleeding.  Vital Signs .    Vitals:   04/21/24 0857 04/21/24 0900 04/21/24 0905 04/21/24 0920  BP: 119/69 118/74  127/67  Pulse: 90 92 94 90  Resp: 19 18 19  (!) 23  Temp: 98.2 F (36.8 C)   98.1 F (36.7 C)  TempSrc: Oral     SpO2: 98% 98% 98%   Weight:      Height:        Intake/Output Summary (Last 24 hours) at 04/21/2024 1027 Last data filed at 04/21/2024 0900 Gross per 24 hour  Intake 2014.17 ml  Output 3425 ml  Net -1410.83 ml      04/21/2024    3:49 AM 04/20/2024    5:00 AM 04/19/2024    4:28 AM  Last 3 Weights  Weight (lbs) 187 lb 2.7 oz 196 lb 3.4 oz 194 lb 10.7 oz  Weight (kg) 84.9 kg 89 kg 88.3 kg      Telemetry/ECG    Atrial fibrillation heart rates in the 80s- Personally Reviewed  CV Studies    Limited echocardiogram 04/13/2024  1. Left ventricular ejection fraction, by estimation, is 30 to 35%. The  left ventricle has moderately decreased function. The left ventricle  demonstrates regional wall motion abnormalities (see scoring  diagram/findings for description). The left  ventricular internal cavity size was mildly dilated.   2. Right ventricular systolic function is moderately reduced. The right  ventricular size is moderately enlarged.   3. Moderate to severe mitral valve regurgitation.   4. Tricuspid valve regurgitation is moderate.   5. The aortic valve is tricuspid. Aortic valve regurgitation is not  visualized. No aortic stenosis is present.   Physical Exam .   GEN: No acute distress.  NG tube in place Neck: No JVD Cardiac: Irregularly irregular, 2 out of 6 murmur Respiratory:  Clear to auscultation bilaterally. GI: Soft, nontender, non-distended  MS: No edema  Patient Profile    MITCHAEL Morales is a 68 y.o. adult has hx of longstanding persistent atrial fibrillation/flutter, HFrEF, hypertension, hyperlipidemia, hypothyroidism.  Patient admitted for complaints of abdominal pain, diagnosed with SMV thrombosis with small bowel ischemia, bacterial translocation and septic shock.  He had surgical resection of the bowel 5/11 and 5/12.  Cardiology asked to see due to atrial fibrillation/CHF  Assessment & Plan .     Longstanding persistent atrial fibrillation Question whether this is related to ischemic MR.  Fortunately heart rates controlled generally in the 80s.  Asymptomatic. Evaluated by his primary cardiologist, Dr Arlester Ladd with reluctance to pursue rhythm control since he has not been anticoagulated.  He has been on IV amiodarone  since 5/13 for rate control.  Not able to take p.o., also been getting IV Lopressor  2.5 mg Q6.  Eventually would like to transition both of these to p.o when able to. Holding heparin  drip in setting of drop in hemoglobin and suspected upper GI bleeding.  No plans for DCCV until can be anticoagulated  Chronic HFrEF Limited echocardiogram this admission shows EF 30 to 35% with wall motion abnormalities in the inferior, anterior, lateral walls.  These wall motion abnormalities  are not new though, also documented in March 2023 echo.   RV is moderately reduced.  Euvolemic. Agree with stopping IV Lasix .  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  Will discuss with MD if any plans of cardiac cath this admission once more stable or if to pursue work up outpatient. Has no chest pain.   Moderate to severe MR This is a new finding based off of echocardiogram this admission.  Likely functional in setting of systolic dysfunction  SMV  thrombosis Small bowel ischemia Anemia Management per surgery.  NG tube with very dark fluid.  Hemoglobin has dropped from 9.5-7 today, getting 1 unit of blood.  Holding heparin  gtt.  For questions or updates, please contact [facility] Please consult [email or web address] for contact info under       Signed, Burnetta Cart, PA-C    Patient seen and examined.  Agree with above documentation.  On exam, patient is alert and oriented, regular rate and rhythm, no murmurs, lungs CTAB, no LE edema.  Hemoglobin dropped from 9.5-7.  Having dark fluid out of NG tube, concern for upper GI bleed.  Heparin  drip held and has been started on Protonix  drip.  Remains unable to take p.o., cannot start GDMT at this time.  Appears euvolemic, will check CVP hold IV Lasix  at this time.  Wendie Hamburg, MD

## 2024-04-22 DIAGNOSIS — I5022 Chronic systolic (congestive) heart failure: Secondary | ICD-10-CM | POA: Diagnosis not present

## 2024-04-22 DIAGNOSIS — I4819 Other persistent atrial fibrillation: Secondary | ICD-10-CM | POA: Diagnosis not present

## 2024-04-22 DIAGNOSIS — D62 Acute posthemorrhagic anemia: Secondary | ICD-10-CM | POA: Diagnosis not present

## 2024-04-22 DIAGNOSIS — K559 Vascular disorder of intestine, unspecified: Secondary | ICD-10-CM | POA: Diagnosis not present

## 2024-04-22 LAB — GLUCOSE, CAPILLARY
Glucose-Capillary: 121 mg/dL — ABNORMAL HIGH (ref 70–99)
Glucose-Capillary: 125 mg/dL — ABNORMAL HIGH (ref 70–99)
Glucose-Capillary: 125 mg/dL — ABNORMAL HIGH (ref 70–99)
Glucose-Capillary: 124 mg/dL — ABNORMAL HIGH (ref 70–99)
Glucose-Capillary: 133 mg/dL — ABNORMAL HIGH (ref 70–99)
Glucose-Capillary: 139 mg/dL — ABNORMAL HIGH (ref 70–99)

## 2024-04-22 LAB — COMPREHENSIVE METABOLIC PANEL WITH GFR
ALT: 52 U/L — ABNORMAL HIGH (ref 0–44)
AST: 37 U/L (ref 15–41)
Albumin: 2.2 g/dL — ABNORMAL LOW (ref 3.5–5.0)
Alkaline Phosphatase: 65 U/L (ref 38–126)
Anion gap: 9 (ref 5–15)
BUN: 33 mg/dL — ABNORMAL HIGH (ref 8–23)
CO2: 21 mmol/L — ABNORMAL LOW (ref 22–32)
Calcium: 7.8 mg/dL — ABNORMAL LOW (ref 8.9–10.3)
Chloride: 110 mmol/L (ref 98–111)
Creatinine, Ser: 0.7 mg/dL (ref 0.61–1.24)
GFR, Estimated: >60 mL/min (ref >60)
Glucose, Bld: 128 mg/dL — ABNORMAL HIGH (ref 70–99)
Potassium: 4.1 mmol/L (ref 3.5–5.1)
Sodium: 140 mmol/L (ref 135–145)
Total Bilirubin: 2.8 mg/dL — ABNORMAL HIGH (ref 0.0–1.2)
Total Protein: 5.3 g/dL — ABNORMAL LOW (ref 6.5–8.1)

## 2024-04-22 LAB — TYPE AND SCREEN
ABO/RH(D): O NEG
Antibody Screen: NEGATIVE
Unit division: 0
Unit division: 0

## 2024-04-22 LAB — BILIRUBIN, DIRECT: Bilirubin, Direct: 1.4 mg/dL — ABNORMAL HIGH (ref 0.0–0.2)

## 2024-04-22 LAB — BPAM RBC
Blood Product Expiration Date: 202506202359
Blood Product Expiration Date: 202506202359
ISSUE DATE / TIME: 202505190844
ISSUE DATE / TIME: 202505192109
Unit Type and Rh: 9500
Unit Type and Rh: 9500

## 2024-04-22 LAB — CBC
HCT: 28 % — ABNORMAL LOW (ref 39.0–52.0)
Hemoglobin: 8.5 g/dL — ABNORMAL LOW (ref 13.0–17.0)
MCH: 24.6 pg — ABNORMAL LOW (ref 26.0–34.0)
MCHC: 30.4 g/dL (ref 30.0–36.0)
MCV: 80.9 fL (ref 80.0–100.0)
Platelets: 152 10*3/uL (ref 150–400)
RBC: 3.46 MIL/uL — ABNORMAL LOW (ref 4.22–5.81)
RDW: 19.7 % — ABNORMAL HIGH (ref 11.5–15.5)
WBC: 11.1 10*3/uL — ABNORMAL HIGH (ref 4.0–10.5)
nRBC: 1.6 % — ABNORMAL HIGH (ref 0.0–0.2)

## 2024-04-22 LAB — PHOSPHORUS: Phosphorus: 3.4 mg/dL (ref 2.5–4.6)

## 2024-04-22 LAB — MAGNESIUM: Magnesium: 2 mg/dL (ref 1.7–2.4)

## 2024-04-22 MED ORDER — PHENOL 1.4 % MT LIQD: 2.0000 | OROMUCOSAL | Status: DC | PRN

## 2024-04-22 MED ORDER — ALUM & MAG HYDROXIDE-SIMETH 200-200-20 MG/5ML PO SUSP: 30.0000 mL | Freq: Four times a day (QID) | ORAL | Status: DC | PRN

## 2024-04-22 MED ORDER — SIMETHICONE 80 MG PO CHEW
80.0000 mg | CHEWABLE_TABLET | Freq: Four times a day (QID) | ORAL | Status: AC
  Administered 2024-04-22 – 2024-04-24 (×10): 80 mg via ORAL
  Filled 2024-04-22 (×12): qty 1

## 2024-04-22 MED ORDER — TRAVASOL 10 % IV SOLN
INTRAVENOUS | Status: AC
  Filled 2024-04-22: qty 1224

## 2024-04-22 MED ORDER — MENTHOL 3 MG MT LOZG: 1.0000 | LOZENGE | OROMUCOSAL | Status: DC | PRN

## 2024-04-22 MED ORDER — MAGIC MOUTHWASH
15.0000 mL | Freq: Four times a day (QID) | ORAL | Status: AC
  Administered 2024-04-22 – 2024-04-23 (×8): 15 mL via ORAL
  Filled 2024-04-22 (×8): qty 15

## 2024-04-22 NOTE — Progress Notes (Addendum)
 Patient Name: Donald Morales Date of Encounter: 04/22/2024 [facility] Cardiologist: None   Interval Summary  .    S/p PRBC transfusion yesterday, hemoglobin improved from 7.0-8.5.  Creatinine stable at 0.7.  BP 156/94 this morning.  He reports feeling improved this morning.  Denies any chest pain or dyspnea  Vital Signs .    Vitals:   04/22/24 0500 04/22/24 0600 04/22/24 0800 04/22/24 0821  BP: (!) 156/104 (!) 147/81 (!) 158/99 (!) 156/94  Pulse: 90 98 100 (!) 101  Resp: (!) 23 (!) 21 20 20   Temp:   98.7 F (37.1 C)   TempSrc:   Oral   SpO2: 96% 95% 96% 95%  Weight: 86.6 kg     Height:        Intake/Output Summary (Last 24 hours) at 04/22/2024 0851 Last data filed at 04/22/2024 0800 Gross per 24 hour  Intake 3640.49 ml  Output 2275 ml  Net 1365.49 ml      04/22/2024    5:00 AM 04/21/2024    3:49 AM 04/20/2024    5:00 AM  Last 3 Weights  Weight (lbs) 190 lb 14.7 oz 187 lb 2.7 oz 196 lb 3.4 oz  Weight (kg) 86.6 kg 84.9 kg 89 kg      Telemetry/ECG    Atrial fibrillation, rate controlled- Personally Reviewed  CV Studies    Limited echocardiogram 04/13/2024  1. Left ventricular ejection fraction, by estimation, is 30 to 35%. The  left ventricle has moderately decreased function. The left ventricle  demonstrates regional wall motion abnormalities (see scoring  diagram/findings for description). The left  ventricular internal cavity size was mildly dilated.   2. Right ventricular systolic function is moderately reduced. The right  ventricular size is moderately enlarged.   3. Moderate to severe mitral valve regurgitation.   4. Tricuspid valve regurgitation is moderate.   5. The aortic valve is tricuspid. Aortic valve regurgitation is not  visualized. No aortic stenosis is present.   Physical Exam .   GEN: No acute distress.  NG tube in place Neck: No JVD Cardiac: Irregularly irregular, 2 out of 6 murmur Respiratory: Clear to auscultation  bilaterally. GI: Soft, nontender, non-distended  MS: No edema  Patient Profile    Donald Morales is a 68 y.o. adult has hx of longstanding persistent atrial fibrillation/flutter, HFrEF, hypertension, hyperlipidemia, hypothyroidism.  Patient admitted for complaints of abdominal pain, diagnosed with SMV thrombosis with small bowel ischemia, bacterial translocation and septic shock.  He had surgical resection of the bowel 5/11 and 5/12.  Cardiology asked to see due to atrial fibrillation/CHF  Assessment & Plan .     Longstanding persistent atrial fibrillation Question whether this is related to ischemic MR.  Fortunately heart rates controlled generally in the 80s.  Asymptomatic. He has been on IV amiodarone  since 5/13 for rate control.  Not able to take p.o., also been getting IV Lopressor  2.5 mg Q6.  Will transition to p.o. once able Holding heparin  drip in setting of drop in hemoglobin and suspected upper GI bleeding.  Would restart when able  Chronic HFrEF Limited echocardiogram this admission shows EF 30 to 35% with wall motion abnormalities in the inferior, anterior, lateral walls.  These wall motion abnormalities are not new though, also documented in March 2023 echo.   RV is moderately reduced.   Currently appears euvolemic, IV Lasix  held.  CVP 4 this morning Cardiomyopathy could be tachycardia induced but will need to rule out obstructive CAD  once recovers from GI issues  Moderate to severe MR This is a new finding based off of echocardiogram this admission.  Likely functional in setting of systolic dysfunction  SMV thrombosis Small bowel ischemia Anemia Management per surgery.  NG tube with very dark fluid.  Hemoglobin dropped from 9.5-7 on 5/19, received 1 unit PRBCs.  Restart heparin  drip once stable from bleeding standpoint  For questions or updates, please contact [facility] Please consult [email or web address] for contact info under       Signed, Wendie Hamburg, MD

## 2024-04-22 NOTE — Progress Notes (Signed)
 PROGRESS NOTE  Donald Morales  DOB: 12-01-1956  PCP: Rae Bugler, MD ZOX:[id number]  DOA: 04/12/2024  LOS: 9 days  Hospital Day: 11  Brief narrative: Donald Morales is a 68 y.o. adult with PMH significant for HTN, HLD, atrial flutter not on anticoagulation, hypothyroidism, BPH. 5/11, patient presented to the ED with progressive abdominal pain for few weeks worse on last few days.  Last BM was 5 days prior to presentation.  CT abdomen showed bowel wall thickening and also raised concern for SMV thrombosis.  Labs showed WC count of 12.1 and lactic acid elevated 2.8 and trended up to 4.9  Patient was seen by general surgery and taken to the OR emergently. Underwent diagnostic laparoscopy, converted to open.  Underwent resection of 40 cm of necrotic small bowel.  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 Left in discontinuity to return to OR in 24-48hrs.  Patient was admitted to ICU postop.  Required 20 phenylephrine  drip. 5/12, taken to OR for further bowel resection and closure of abdomen.  Postop, patient had Afib with RVR required amiodarone .  Eventually weaned off pressors, started on TPN,anticoagulation with IV heparin  drip.  5/14, extubated  5/16, transferred out to [facility]  Subjective: Patient was seen and examined this morning.   Sitting up in recliner.  Has NG tube attached to suction.  Still draining greenish gastric content.  Passing gas, no bowel movement yet. Remains on amiodarone  drip.  Heparin  drip on hold because of drop in hemoglobin yesterday. Patient is in good spirits today.  He is confident that he would have a bowel movement today!  Assessment and plan: Mesenteric Vein Thrombosis --> Small bowel ischemia --> Bacterial translocation -- > Septic shock S/p surgical resection of bowel on 5/11, and further resection and closure on 5/12 Primary management per general surgery Has wound VAC in place Restarted IV Zosyn  on 5/19 No fever.  WBC count improving.  If he starts  to mount fever or WBC continues to rise, may need to think of fungemia as well with TPN Continue IV fentanyl  as needed for pain control Initially required vasopressors postop.  Weaned off. Hemodynamically improving now Recent Labs  Lab 04/17/24 0510 04/18/24 0529 04/19/24 0555 04/21/24 0242 04/21/24 0905 04/22/24 0247  WBC 10.3 9.0 11.4* 18.4*  --  11.1*  LATICACIDVEN  --   --  1.0  --  1.0  --    Postop ileus NG tube remains.  Has greenish content in canister. Passing gas but no bowel movement. Remains on TPN.  Acute blood loss anemia Suspect upper GI bleeding Patient had some surgical blood loss but hemoglobin was stable level 9.  5/19, noted to have a significant drop in hemoglobin, also had coffee-ground NG content. Heparin  drip was held.  1 PRBC transfusion given.  Started on Protonix  drip.  Hemoglobin improved 8.5 today.  NG tube containing greenish Repeat hemoglobin tomorrow.  Plan to resume heparin  tomorrow if stable hemoglobin Recent Labs    04/18/24 0529 04/19/24 0555 04/21/24 0242 04/21/24 1224 04/22/24 0247  HGB 10.1* 9.5* 7.0* 7.0* 8.5*  MCV 75.4* 75.5* 76.4*  --  80.9   Hypokalemia/hypophosphatemia Secondary to GI loss from NG tube drainage Levels improved with replacement. Recent Labs  Lab 04/18/24 0529 04/19/24 0555 04/20/24 0355 04/21/24 0242 04/22/24 0247  K 3.7 3.9 4.4 4.2 4.1  MG 1.9 1.9 2.1 2.0 2.0  PHOS 2.9 2.9 3.3 3.3 3.4   Hyponatremia Likely from low intake.  Continue to monitor. Recent Labs  Lab 04/16/24 0530 04/17/24 0510 04/18/24 0529 04/19/24 0555 04/20/24 0355 04/21/24 0242 04/22/24 0247  NA 139 135 131* 133* 134* 136 140   Acute worsening of chronic systolic CHF Moderate to severe MR  Hypertension EF in 2023 with 35 to 40%.  Echo 5/11 showed EF of 30 to 35% with regional WMA and moderately reduced RV function, moderate to severe MR, moderate TR. Cardiology following.  Patient needs GDMT initiated once oral intake  improves. Received 1 of IV Lasix  on 5/17.  BUN trended up.  Currently patient is already losing fluid.  Lasix  kept on hold.  A-fib with RVR Has history of A-fib and was seen by EP in the past but did not follow-up.  Qualified for anticoagulation but did not comply with it. With RVR this hospitalization, patient was started on amiodarone  drip, digoxin  and heparin  drip. Cardiology consult appreciated. Currently continued on IV amiodarone .  Heparin  drip held because of acute bleeding.  Elevated LFTs  likely component of shock liver Gradually improving AST and ALT but total bili rising up.  Alk phos normal.  Obtain direct bili oh Repeat labs tomorrow Recent Labs  Lab 04/16/24 0530 04/17/24 0510 04/21/24 0242 04/22/24 0247  AST 69* 58* 29 37  ALT 214* 183* 57* 52*  ALKPHOS 57 62 63 65  BILITOT 1.1 1.6* 2.0* 2.8*  PROT 5.6* 6.0* 5.8* 5.3*  ALBUMIN  2.6* 2.6* 2.3* 2.2*   Hyperglycemia A1c 6.3.  No prior history of diabetes Currently on SSI/Accu-Cheks Recent Labs  Lab 04/21/24 1939 04/21/24 2346 04/22/24 0501 04/22/24 0801 04/22/24 1209  GLUCAP 134* 135* 121* 125* 125*   Hx of hypothyroidism tsh 4.6 Oral Synthroid  resume once able to tolerate oral intake IV Synthroid  currently.  Acute urinary retention  BPH Patient endorses lower urinary tract symptoms but states he was afraid to go to the urologist.   5/16, started on Flomax .  Able to void.  Continue to monitor.   Mobility: PT encourage ambulation once clinically improved.  Goals of care   Code Status: Full Code     DVT prophylaxis:  SCDs Start: 04/13/24 0500   Antimicrobials: IV Zosyn  to continue Fluid: TPN Consultants: General Surgery, cardiology  Family Communication: None at bedside  Status: Inpatient Level of care:  ICU   Patient is from: Home Needs to continue in-hospital care: Postop ileus status Anticipated d/c to: Pending clinical course   Diet:  Diet Order             Diet clear liquid  Room service appropriate? Yes; Fluid consistency: Thin  Diet effective now                   Scheduled Meds:  sodium chloride    Intravenous Once   Chlorhexidine  Gluconate Cloth  6 each Topical Q0600   insulin  aspart  0-15 Units Subcutaneous Q4H   [START ON 04/25/2024] levothyroxine   25 mcg Intravenous Daily   magic mouthwash  15 mL Oral QID   metoprolol  tartrate  2.5 mg Intravenous Q6H   [START ON 04/24/2024] pantoprazole   40 mg Intravenous Q12H   pneumococcal 20-valent conjugate vaccine  0.5 mL Intramuscular Tomorrow-1000   scopolamine   1 patch Transdermal Q72H   simethicone  80 mg Oral QID   sodium chloride  flush  10-40 mL Intracatheter Q12H   tamsulosin   0.4 mg Oral Daily    PRN meds: alum & mag hydroxide-simeth, fentaNYL  (SUBLIMAZE ) injection, hydrALAZINE , menthol-cetylpyridinium, ondansetron  (ZOFRAN ) IV, phenol, prochlorperazine , sodium chloride  flush   Infusions:  amiodarone  30 mg/hr (04/22/24 1242)   pantoprazole  8 mg/hr (04/22/24 1033)   piperacillin -tazobactam (ZOSYN )  IV 3.375 g (04/22/24 1213)   TPN ADULT (ION) 85 mL/hr at 04/22/24 0716   TPN ADULT (ION)      Antimicrobials: Anti-infectives (From admission, onward)    Start     Dose/Rate Route Frequency Ordered Stop   04/21/24 1200  piperacillin -tazobactam (ZOSYN ) IVPB 3.375 g        3.375 g 12.5 mL/hr over 240 Minutes Intravenous Every 8 hours 04/21/24 1038     04/14/24 1000  vancomycin  (VANCOREADY) IVPB 1500 mg/300 mL  Status:  Discontinued        1,500 mg 150 mL/hr over 120 Minutes Intravenous Every 24 hours 04/13/24 0731 04/15/24 1416   04/14/24 0700  vancomycin  (VANCOREADY) IVPB 1750 mg/350 mL  Status:  Discontinued        1,750 mg 175 mL/hr over 120 Minutes Intravenous Every 24 hours 04/13/24 0547 04/13/24 0731   04/13/24 0615  vancomycin  (VANCOREADY) IVPB 1750 mg/350 mL        1,750 mg 175 mL/hr over 120 Minutes Intravenous  Once 04/13/24 0525 04/13/24 0811   04/13/24 0400  piperacillin -tazobactam  (ZOSYN ) IVPB 3.375 g  Status:  Discontinued        3.375 g 100 mL/hr over 30 Minutes Intravenous Every 6 hours 04/13/24 0240 04/13/24 0244   04/13/24 0400  piperacillin -tazobactam (ZOSYN ) IVPB 3.375 g  Status:  Discontinued        3.375 g 12.5 mL/hr over 240 Minutes Intravenous Every 8 hours 04/13/24 0245 04/18/24 0936   04/12/24 2245  piperacillin -tazobactam (ZOSYN ) IVPB 3.375 g        3.375 g 100 mL/hr over 30 Minutes Intravenous  Once 04/12/24 2237 04/12/24 2333       Objective: Vitals:   04/22/24 1136 04/22/24 1200  BP:  (!) 154/75  Pulse:  85  Resp:  19  Temp: 98.2 F (36.8 C)   SpO2:  99%    Intake/Output Summary (Last 24 hours) at 04/22/2024 1349 Last data filed at 04/22/2024 1033 Gross per 24 hour  Intake 2793.26 ml  Output 1925 ml  Net 868.26 ml   Filed Weights   04/20/24 0500 04/21/24 0349 04/22/24 0500  Weight: 89 kg 84.9 kg 86.6 kg   Weight change: 1.7 kg Body mass index is 28.19 kg/m.   Physical Exam: General exam: Pleasant, elderly Caucasian male.  NG tube in place with greenish content in canister Skin: No rashes, lesions or ulcers. HEENT: Atraumatic, normocephalic, no obvious bleeding Lungs: Clear to auscultation bilaterally,  CVS: S1, S2, faint pansystolic murmur GI/Abd: Soft, appropriate postop tenderness, nondistended, bowel sound sluggish,   CNS: Slow to respond but alert, awake, oriented x 3 Psychiatry: In good spirits today. Extremities: No pedal edema, no calf tenderness  Data Review: I have personally reviewed the laboratory data and studies available.  F/u labs  Unresulted Labs (From admission, onward)     Start     Ordered   04/23/24 0500  Comprehensive metabolic panel  Tomorrow morning,   R       Question:  Specimen collection method  Answer:  Unit=Unit collect   04/22/24 0915   04/22/24 1349  Bilirubin, direct  Add-on,   AD       Question:  Specimen collection method  Answer:  Unit=Unit collect   04/22/24 1348   04/21/24 0500   Triglycerides  (TPN Lab Panel)  Every Monday (0500),   R  Question:  Specimen collection method  Answer:  Unit=Unit collect   04/15/24 1416   04/21/24 0500  CBC  Daily,   R     Question:  Specimen collection method  Answer:  Unit=Unit collect   04/20/24 0426   04/17/24 0500  Comprehensive metabolic panel  (TPN Lab Panel)  Every Mon,Thu (0500),   R     Question:  Specimen collection method  Answer:  Unit=Unit collect   04/15/24 1416   04/17/24 0500  Magnesium   (TPN Lab Panel)  Every Mon,Thu (0500),   R     Question:  Specimen collection method  Answer:  Unit=Unit collect   04/15/24 1416   04/17/24 0500  Phosphorus  (TPN Lab Panel)  Every Mon,Thu (0500),   R     Question:  Specimen collection method  Answer:  Unit=Unit collect   04/15/24 1416   04/14/24 0500  Creatinine, serum  Daily,   R      04/13/24 0545           Signed, Hoyt Macleod, MD [facility] 04/22/2024

## 2024-04-22 NOTE — Progress Notes (Signed)
 PHARMACY - TOTAL PARENTERAL NUTRITION CONSULT NOTE   Indication: bowel obstruction  Patient Measurements: Height: 5\' 9"  (175.3 cm) Weight: 86.6 kg (190 lb 14.7 oz) IBW/kg (Calculated) : 70.7 TPN AdjBW (KG): 75.2 Body mass index is 28.19 kg/m. Usual Weight:   Assessment:  65 yoM admitted on 5/11 with abdominal pain and AFib with RVR and concern of SMV thrombosis.  S/p small bowel resection x2 with anastomosis and abdominal wall closure on 5/12.  Pharmacy is consulted to dose TPN.  Glucose / Insulin : No hx DM noted.  A1c 6.3.   - 5/10 Dexamethasone  10mg , 5/12 Dexamethasone  5mg  - CBGs < 180 (range 113-135) on mSSI (10 units/ 24 hrs) Electrolytes: Elytes WNL  Renal: SCr WNL, BUN 33 Hepatic: LFTs continue to trend down. Tbili 2.8, trending up - TG 54 (5/18) Intake / Output; MIVF:  - Intake: no mIVF.  Lasix  IV x1 on 5/14 - Output: UOP 925 mL, NG 2250 mL GI Imaging: GI Surgeries / Procedures:  - 5/11 OR:  lap > open for necrotic small bowel w/ 40cm small bowel resected and notable mesenteric vein thromboses. Left in discontinuity. - 5/12 OR: 2nd look, 40 cm small bowel resection, anastomosis, abdominal wall closure  Central access: CVC 5/11 TPN start date: 5/13  Nutritional Goals: Goal TPN rate is 85 mL/hr and provides 122 g of protein and 1871 kcals per day.   RD Assessment: ( 5/16)  Estimated Needs Total Energy Estimated Needs: 2000-2250 kcals Total Protein Estimated Needs: 110-125 grams Total Fluid Estimated Needs: >/= 2L  Current Nutrition:  CL  Plan: Continue TPN at 85 mL/hr at 1800, the target rate  Electrolytes in TPN:  Na 120 mEq/L K  75 mEq/L Ca 8 mEq/L Mg 59mEq/L  Phos 20 mmol/L Cl:Ac 1:1 Add standard MVI and trace elements to TPN Continue moderate SSI and adjust as needed  No IVF per MD. Monitor TPN labs on Mon/Thurs and prn   Luise Saint, PharmD, BCPS Secure Chat if ?s 04/22/2024 9:12 AM

## 2024-04-22 NOTE — TOC Progression Note (Signed)
 Transition of Care [facility]) - Progression Note    Patient Details  Name: Donald Morales MRN: [id number] Date of Birth: 03-06-1956  [facility] CM/SW Contact  Jonni Nettle, LCSW Phone Number: 04/22/2024, 2:59 PM  Clinical Narrative:    Per PT, pt still recommended for SNF rehabilitation. CSW sent referrals for short-term SNF in [place] and surrounding areas. TOC will continue to follow.   Expected Discharge Plan: Home/Self Care Barriers to Discharge: Continued Medical Work up  Expected Discharge Plan and Services SNF    Social Determinants of Health (SDOH) Interventions SDOH Screenings   Food Insecurity: Patient Unable To Answer (04/13/2024)  Housing: Patient Unable To Answer (04/13/2024)  Transportation Needs: Patient Unable To Answer (04/13/2024)  Utilities: Patient Unable To Answer (04/13/2024)  Social Connections: Patient Unable To Answer (04/13/2024)  Tobacco Use: Low Risk  (04/12/2024)    Readmission Risk Interventions    04/16/2024   11:56 AM  Readmission Risk Prevention Plan  Transportation Screening Complete  PCP or Specialist Appt within 5-7 Days Complete  Home Care Screening Complete  Medication Review (RN CM) Complete

## 2024-04-22 NOTE — Progress Notes (Signed)
 04/22/2024  [name] [id number] 04/02/1956  CARE TEAM: PCP: Rae Bugler, MD  Outpatient Care Team: Patient Care Team: Rae Bugler, MD as PCP - General (Family Medicine) Mealor, Donnamae Gaba, MD as PCP - Electrophysiology (Cardiology) Dorsey Gault, MD as Consulting Physician (Cardiology)  Inpatient Treatment Team: Treatment Team:  Hoyt Macleod, MD Ccs, Md, MD Eliot Guernsey, MD Lbcardiology, Zola Hint, MD Calista Catching, RN Fayne Hoover, RN Layvonne Primas, [place] Ane Keener, RN May, Serita Danes, RN Luise Saint, [facility] Trenda Frisk, RN   Problem List:   Principal Problem:   Ischemic bowel disease 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   Postoperative diagnosis: Same   Procedure: Exploratory laparotomy with resection of approximately 40 cm of ileum with primary anastomosis and closure of abdomen and placement of subcutaneous wound VAC   Surgeon: Sim Dryer, MD   Assistant: Berkeley Breath, PA-C   04/13/2024  Surgeons and Role: Edmon Gosling, MD - Primary   Preoperative Diagnosis: Bowel ischemia   Postoperative Diagnosis: small bowel ischemia   Procedure(s) Performed:   - Foley catheter placement - Diagnostic laparoscopy - Exploratory laparotomy - Small bowel resection    Assessment [facility] Stay = 9 days) 8 Days Post-Op    SMV thrombosis and Small bowel ischemia   POD8 ex-lap, small bowel resection, open abdominal VAC Dr. Ramiro Burly  POD7 ex-lap, resection 40 cm ileum with primary anastomosis and closure of abdomen Dr. Afton Horse  Ileus inconsistently resolving.    Plan:  - NG removed, with recurrent nausea and vomiting.  NG tube replaced 5/17. - Volume has gone down & he claims flatus.  Will do NG tube clamping trials.  He begged for the NGT to  come out but I cautioned against doing that since we already had to replace it once.  Trying to be steady & safe. - Palliate sore throat better with scheduled Magic mouthwash.  Add HurriCaine spray and lozenges as well to help out.   - continue TPN to avoid malnutrition. - mobilize as tolerated  - VAC changes Mon Thu -Anticoagulation to avoid repeat embolization per main service.  Hemoglobin proved with transfusion yesterday.  I think if hemoglobin remains stable in the morning, then can restart anticoagulation.   FEN: NPO, TPN, NGT clamping trial VTE: hep gtt when Hgb stable, prob 5/21 AM ID: [email or web address]  5/10>5/16   - per CCM -  HFrEF HTN Thyroid disease A. Fib not on anticoagulation  I updated the patient's status to the patient and nurse  Recommendations were made.  Questions were answered.  They expressed understanding & appreciation.        I reviewed nursing notes, Consultant Cardiology notes, hospitalist notes, last 24 h vitals and pain scores, last 48 h intake and output, last 24 h labs and trends, and last 24 h imaging results.  I have reviewed this patient's available data, including medical history, events of note, test results, etc as part of my evaluation.   A significant portion of that time was spent in counseling. Care during the described time interval was provided by me.  This care required moderate level of medical decision making.  04/22/2024    Subjective: (Chief complaint)  Patient claims more bowel movements.  Denies any nausea.  Begging for NG tube to come out.  Sitting up in chair.  Neuro ICU nurse in room.  Denies abdominal pain.  Transfused.  Objective:  Vital signs:  Vitals:   04/22/24 0345 04/22/24 0400 04/22/24 0500 04/22/24 0600  BP:  130/74 (!) 156/104 (!) 147/81  Pulse:  83 90 98  Resp:  20 (!) 23 (!) 21  Temp: 98.2 F (36.8 C)     TempSrc: Oral     SpO2:  96% 96% 95%  Weight:   86.6 kg   Height:        Last BM Date :   (PTA)  Intake/Output   Yesterday:  05/19 0701 - 05/20 0700 In: 4773.2 [I.V.:3626.4; Blood:1048.7; IV Piggyback:98.1] Out: 2075 [Urine:1475; Emesis/NG output:400; Blood:200] This shift:  Total I/O In: 767.9 [I.V.:745.3; IV Piggyback:22.6] Out: -   Bowel function:  Flatus: YES  BM: Not documented but patient claims he had 1.  Drain: Nasogastric tube with dark green mildly thick effluent in canister   Physical Exam:  General: Pt awake/alert in no acute distress.  Sitting up in chair Eyes: PERRL, normal EOM.  Sclera clear.  No icterus Neuro: CN II-XII intact w/o focal sensory/motor deficits. Lymph: No head/neck/groin lymphadenopathy Psych:  No delerium/psychosis/paranoia.  Oriented x 4 HENT: Normocephalic, Mucus membranes moist.  No thrush Neck: Supple, No tracheal deviation.  No obvious thyromegaly Chest: No pain to chest wall compression.  Good respiratory excursion.  No audible wheezing CV:  Pulses intact.  Regular rhythm.  No major extremity edema MS: Normal AROM mjr joints.  No obvious deformity  Abdomen: Soft.  Mildy distended.  Nontender.  Midline incision with black sponge wound VAC in place with serosanguineous drainage.  No evidence of peritonitis.  No incarcerated hernias.  Ext:   No deformity.  No mjr edema.  No cyanosis Skin: No petechiae / purpurea.  No major sores.  Warm and dry    Results:   Cultures: Recent Results (from the past 720 hours)  MRSA Next Gen by PCR, Nasal     Status: None   Collection Time: 04/13/24  5:52 AM   Specimen: Nasal Mucosa; Nasal Swab  Result Value Ref Range Status   MRSA by PCR Next Gen NOT DETECTED NOT DETECTED Final    Comment: (NOTE) The GeneXpert MRSA Assay (FDA approved for NASAL specimens only), is one component of a comprehensive MRSA colonization surveillance program. It is not intended to diagnose MRSA infection nor to guide or monitor treatment for MRSA infections. Test performance is not FDA approved in patients  less than 21 years old. Performed at [facility], [address], [place], [place] [address]   MRSA Next Gen by PCR, Nasal     Status: None   Collection Time: 04/15/24 10:04 AM   Specimen: Nasal Mucosa; Nasal Swab  Result Value Ref Range Status   MRSA by PCR Next Gen NOT DETECTED NOT DETECTED Final    Comment: (NOTE) The GeneXpert MRSA Assay (FDA approved for NASAL specimens only), is one component of a comprehensive MRSA colonization surveillance program. It is not intended to diagnose MRSA infection nor to guide or monitor treatment for MRSA infections. Test performance is not FDA approved in patients less than 37 years old. Performed at [facility], [address] [place], [place] [address]     Labs: Results for orders placed or performed during the hospital encounter of 04/12/24 (from the past 48 hours)  Glucose, capillary     Status: Abnormal   Collection Time: 04/20/24 11:42 AM  Result Value Ref Range  Glucose-Capillary 148 (H) 70 - 99 mg/dL    Comment: Glucose reference range applies only to samples taken after fasting for at least 8 hours.   Comment 1 Notify RN   Heparin  level (unfractionated)     Status: None   Collection Time: 04/20/24 12:10 PM  Result Value Ref Range   Heparin  Unfractionated 0.59 0.30 - 0.70 IU/mL    Comment: (NOTE) The clinical reportable range upper limit is being lowered to >1.10 to align with the FDA approved guidance for the current laboratory assay.  If heparin  results are below expected values, and patient dosage has  been confirmed, suggest follow up testing of antithrombin III levels. Performed at [facility], [address], [place], [place] [address]   Glucose, capillary     Status: Abnormal   Collection Time: 04/20/24  3:18 PM  Result Value Ref Range   Glucose-Capillary 139 (H) 70 - 99 mg/dL    Comment: Glucose reference range applies only to samples taken after fasting  for at least 8 hours.   Comment 1 Notify RN   Glucose, capillary     Status: Abnormal   Collection Time: 04/20/24  8:23 PM  Result Value Ref Range   Glucose-Capillary 147 (H) 70 - 99 mg/dL    Comment: Glucose reference range applies only to samples taken after fasting for at least 8 hours.   Comment 1 Notify RN    Comment 2 Document in Chart   Glucose, capillary     Status: Abnormal   Collection Time: 04/20/24 11:59 PM  Result Value Ref Range   Glucose-Capillary 166 (H) 70 - 99 mg/dL    Comment: Glucose reference range applies only to samples taken after fasting for at least 8 hours.   Comment 1 Notify RN    Comment 2 Document in Chart   Comprehensive metabolic panel     Status: Abnormal   Collection Time: 04/21/24  2:42 AM  Result Value Ref Range   Sodium 136 135 - 145 mmol/L   Potassium 4.2 3.5 - 5.1 mmol/L   Chloride 104 98 - 111 mmol/L   CO2 24 22 - 32 mmol/L   Glucose, Bld 164 (H) 70 - 99 mg/dL    Comment: Glucose reference range applies only to samples taken after fasting for at least 8 hours.   BUN 38 (H) 8 - 23 mg/dL   Creatinine, Ser 6.04 0.61 - 1.24 mg/dL   Calcium 8.1 (L) 8.9 - 10.3 mg/dL   Total Protein 5.8 (L) 6.5 - 8.1 g/dL   Albumin  2.3 (L) 3.5 - 5.0 g/dL   AST 29 15 - 41 U/L   ALT 57 (H) 0 - 44 U/L   Alkaline Phosphatase 63 38 - 126 U/L   Total Bilirubin 2.0 (H) 0.0 - 1.2 mg/dL   GFR, Estimated >54 >09 mL/min    Comment: (NOTE) Calculated using the CKD-EPI Creatinine Equation (2021)    Anion gap 8 5 - 15    Comment: Performed at [facility], [address] [place], [place] [address]  Magnesium      Status: None   Collection Time: 04/21/24  2:42 AM  Result Value Ref Range   Magnesium  2.0 1.7 - 2.4 mg/dL    Comment: Performed at [facility], [address] [place], [place] [address]  Phosphorus     Status: None   Collection Time: 04/21/24  2:42 AM  Result Value Ref Range   Phosphorus 3.3 2.5 -  4.6 mg/dL     Comment: Performed at [facility], [address] [place], [place] [address]  Triglycerides     Status: None   Collection Time: 04/21/24  2:42 AM  Result Value Ref Range   Triglycerides 54 <150 mg/dL    Comment: Performed at [facility], [address] [place], [place] [address]  CBC     Status: Abnormal   Collection Time: 04/21/24  2:42 AM  Result Value Ref Range   WBC 18.4 (H) 4.0 - 10.5 K/uL   RBC 3.13 (L) 4.22 - 5.81 MIL/uL   Hemoglobin 7.0 (L) 13.0 - 17.0 g/dL    Comment: REPEATED TO VERIFY DELTA CHECK NOTED    HCT 23.9 (L) 39.0 - 52.0 %   MCV 76.4 (L) 80.0 - 100.0 fL   MCH 22.4 (L) 26.0 - 34.0 pg   MCHC 29.3 (L) 30.0 - 36.0 g/dL   RDW 14.7 (H) 82.9 - 56.2 %   Platelets 190 150 - 400 K/uL   nRBC 0.3 (H) 0.0 - 0.2 %    Comment: Performed at [facility], [address], [place], [place] [address]  Heparin  level (unfractionated)     Status: None   Collection Time: 04/21/24  2:42 AM  Result Value Ref Range   Heparin  Unfractionated 0.58 0.30 - 0.70 IU/mL    Comment: (NOTE) The clinical reportable range upper limit is being lowered to >1.10 to align with the FDA approved guidance for the current laboratory assay.  If heparin  results are below expected values, and patient dosage has  been confirmed, suggest follow up testing of antithrombin III levels. Performed at [facility], [address], [place], [place] [address]   Glucose, capillary     Status: Abnormal   Collection Time: 04/21/24  4:47 AM  Result Value Ref Range   Glucose-Capillary 137 (H) 70 - 99 mg/dL    Comment: Glucose reference range applies only to samples taken after fasting for at least 8 hours.  Type and screen [facility]     Status: None (Preliminary result)   Collection Time: 04/21/24  5:00 AM  Result Value Ref Range   ABO/RH(D) O NEG    Antibody Screen NEG    Sample Expiration 04/24/2024,2359    Unit  Number [id number]    Blood Component Type RED CELLS,LR    Unit division 00    Status of Unit ISSUED    Transfusion Status OK TO TRANSFUSE    Crossmatch Result Compatible    Unit Number [id number]    Blood Component Type RED CELLS,LR    Unit division 00    Status of Unit ISSUED    Transfusion Status OK TO TRANSFUSE    Crossmatch Result      Compatible Performed at [facility], [address], [place], [place] [address]   Prepare RBC (crossmatch)     Status: None   Collection Time: 04/21/24  5:00 AM  Result Value Ref Range   Order Confirmation      ORDER PROCESSED BY BLOOD BANK Performed at [facility], [address], [place], [place] [address]   Glucose, capillary     Status: Abnormal   Collection Time: 04/21/24  7:40 AM  Result Value Ref Range   Glucose-Capillary 114 (H) 70 - 99 mg/dL    Comment: Glucose reference range applies only to samples taken after fasting for at least 8 hours.  Comment 1 Notify RN    Comment 2 Document in Chart   Lactic acid, plasma     Status: None   Collection Time: 04/21/24  9:05 AM  Result Value Ref Range   Lactic Acid, Venous 1.0 0.5 - 1.9 mmol/L    Comment: Performed at [facility], [address], [place], [place] [address]  Glucose, capillary     Status: Abnormal   Collection Time: 04/21/24 12:11 PM  Result Value Ref Range   Glucose-Capillary 113 (H) 70 - 99 mg/dL    Comment: Glucose reference range applies only to samples taken after fasting for at least 8 hours.   Comment 1 Notify RN    Comment 2 Document in Chart   Hemoglobin and hematocrit, blood     Status: Abnormal   Collection Time: 04/21/24 12:24 PM  Result Value Ref Range   Hemoglobin 7.0 (L) 13.0 - 17.0 g/dL   HCT 53.6 (L) 64.4 - 03.4 %    Comment: Performed at [facility], [address] [place], [place] [address]  Glucose, capillary     Status: Abnormal   Collection Time: 04/21/24  5:04  PM  Result Value Ref Range   Glucose-Capillary 128 (H) 70 - 99 mg/dL    Comment: Glucose reference range applies only to samples taken after fasting for at least 8 hours.   Comment 1 Notify RN    Comment 2 Document in Chart   Prepare RBC (crossmatch)     Status: None   Collection Time: 04/21/24  6:32 PM  Result Value Ref Range   Order Confirmation      ORDER PROCESSED BY BLOOD BANK Performed at [facility], [address] [place], [place] [address]   Glucose, capillary     Status: Abnormal   Collection Time: 04/21/24  7:39 PM  Result Value Ref Range   Glucose-Capillary 134 (H) 70 - 99 mg/dL    Comment: Glucose reference range applies only to samples taken after fasting for at least 8 hours.   Comment 1 Notify RN    Comment 2 Document in Chart   Glucose, capillary     Status: Abnormal   Collection Time: 04/21/24 11:46 PM  Result Value Ref Range   Glucose-Capillary 135 (H) 70 - 99 mg/dL    Comment: Glucose reference range applies only to samples taken after fasting for at least 8 hours.   Comment 1 Notify RN    Comment 2 Document in Chart   CBC     Status: Abnormal   Collection Time: 04/22/24  2:47 AM  Result Value Ref Range   WBC 11.1 (H) 4.0 - 10.5 K/uL   RBC 3.46 (L) 4.22 - 5.81 MIL/uL   Hemoglobin 8.5 (L) 13.0 - 17.0 g/dL   HCT 56.4 (L) 33.2 - 95.1 %   MCV 80.9 80.0 - 100.0 fL   MCH 24.6 (L) 26.0 - 34.0 pg   MCHC 30.4 30.0 - 36.0 g/dL   RDW 88.4 (H) 16.6 - 06.3 %   Platelets 152 150 - 400 K/uL   nRBC 1.6 (H) 0.0 - 0.2 %    Comment: Performed at [facility], [address], [place], [place] [address]  Comprehensive metabolic panel     Status: Abnormal   Collection Time: 04/22/24  2:47 AM  Result Value Ref Range   Sodium 140 135 - 145 mmol/L   Potassium 4.1 3.5 - 5.1 mmol/L   Chloride 110 98 -  111 mmol/L   CO2 21 (L) 22 - 32 mmol/L   Glucose, Bld 128 (H) 70 - 99 mg/dL    Comment: Glucose reference range applies only to samples  taken after fasting for at least 8 hours.   BUN 33 (H) 8 - 23 mg/dL   Creatinine, Ser 1.61 0.61 - 1.24 mg/dL   Calcium 7.8 (L) 8.9 - 10.3 mg/dL   Total Protein 5.3 (L) 6.5 - 8.1 g/dL   Albumin  2.2 (L) 3.5 - 5.0 g/dL   AST 37 15 - 41 U/L   ALT 52 (H) 0 - 44 U/L   Alkaline Phosphatase 65 38 - 126 U/L   Total Bilirubin 2.8 (H) 0.0 - 1.2 mg/dL   GFR, Estimated >09 >60 mL/min    Comment: (NOTE) Calculated using the CKD-EPI Creatinine Equation (2021)    Anion gap 9 5 - 15    Comment: Performed at [facility], [address], [place], [place] [address]  Magnesium      Status: None   Collection Time: 04/22/24  2:47 AM  Result Value Ref Range   Magnesium  2.0 1.7 - 2.4 mg/dL    Comment: Performed at [facility], [address] [place], [place] [address]  Phosphorus     Status: None   Collection Time: 04/22/24  2:47 AM  Result Value Ref Range   Phosphorus 3.4 2.5 - 4.6 mg/dL    Comment: Performed at [facility], [address], [place], [place] [address]  Glucose, capillary     Status: Abnormal   Collection Time: 04/22/24  5:01 AM  Result Value Ref Range   Glucose-Capillary 121 (H) 70 - 99 mg/dL    Comment: Glucose reference range applies only to samples taken after fasting for at least 8 hours.   Comment 1 Notify RN    Comment 2 Document in Chart     Imaging / Studies: DG Abd 1 View Result Date: 04/20/2024 CLINICAL DATA:  Nasogastric tube placement. EXAM: ABDOMEN - 1 VIEW COMPARISON:  Apr 16, 2024. FINDINGS: Nasogastric tube tip seen in expected position of distal stomach or proximal duodenum. No abnormal bowel dilatation. IMPRESSION: Nasogastric tube tip seen in expected position of distal stomach or proximal duodenum. Electronically Signed   By: Rosalene Colon M.D.   On: 04/20/2024 10:13    Medications / Allergies: per chart  Antibiotics: Anti-infectives (From admission, onward)    Start     Dose/Rate Route Frequency  Ordered Stop   04/21/24 1200  piperacillin -tazobactam (ZOSYN ) IVPB 3.375 g        3.375 g 12.5 mL/hr over 240 Minutes Intravenous Every 8 hours 04/21/24 1038     04/14/24 1000  vancomycin  (VANCOREADY) IVPB 1500 mg/300 mL  Status:  Discontinued        1,500 mg 150 mL/hr over 120 Minutes Intravenous Every 24 hours 04/13/24 0731 04/15/24 1416   04/14/24 0700  vancomycin  (VANCOREADY) IVPB 1750 mg/350 mL  Status:  Discontinued        1,750 mg 175 mL/hr over 120 Minutes Intravenous Every 24 hours 04/13/24 0547 04/13/24 0731   04/13/24 0615  vancomycin  (VANCOREADY) IVPB 1750 mg/350 mL        1,750 mg 175 mL/hr over 120 Minutes Intravenous  Once 04/13/24 0525 04/13/24 0811   04/13/24 0400  piperacillin -tazobactam (ZOSYN ) IVPB 3.375 g  Status:  Discontinued        3.375 g 100 mL/hr over 30 Minutes Intravenous Every 6 hours 04/13/24  0240 04/13/24 0244   04/13/24 0400  piperacillin -tazobactam (ZOSYN ) IVPB 3.375 g  Status:  Discontinued        3.375 g 12.5 mL/hr over 240 Minutes Intravenous Every 8 hours 04/13/24 0245 04/18/24 0936   04/12/24 2245  piperacillin -tazobactam (ZOSYN ) IVPB 3.375 g        3.375 g 100 mL/hr over 30 Minutes Intravenous  Once 04/12/24 2237 04/12/24 2333         Note: Portions of this report may have been transcribed using voice recognition software. Every effort was made to ensure accuracy; however, inadvertent computerized transcription errors may be present.   Any transcriptional errors that result from this process are unintentional.    Eddye Goodie, MD, FACS, MASCRS Esophageal, Gastrointestinal & Colorectal Surgery Robotic and Minimally Invasive Surgery  [facility] [address] [place], [place] [address] [phone] Fax [phone] Main  CONTACT INFORMATION: Weekday (9AM-5PM): Call [facility] main office at [phone] Weeknight (5PM-9AM) or Weekend/Holiday: Check EPIC "Web Links" tab &  use "AMION" (password " [email or web address]") for General Surgery [facility] coverage  Please, DO NOT use SecureChat  (it is not reliable communication to reach operating surgeons & will lead to a delay in care).   Epic staff messaging available for outptient concerns needing 1-2 business day response.      04/22/2024  7:50 AM

## 2024-04-23 DIAGNOSIS — I5022 Chronic systolic (congestive) heart failure: Secondary | ICD-10-CM | POA: Diagnosis not present

## 2024-04-23 DIAGNOSIS — D62 Acute posthemorrhagic anemia: Secondary | ICD-10-CM | POA: Diagnosis not present

## 2024-04-23 DIAGNOSIS — I4819 Other persistent atrial fibrillation: Secondary | ICD-10-CM | POA: Diagnosis not present

## 2024-04-23 DIAGNOSIS — K559 Vascular disorder of intestine, unspecified: Secondary | ICD-10-CM | POA: Diagnosis not present

## 2024-04-23 LAB — GLUCOSE, CAPILLARY
Glucose-Capillary: 142 mg/dL — ABNORMAL HIGH (ref 70–99)
Glucose-Capillary: 134 mg/dL — ABNORMAL HIGH (ref 70–99)
Glucose-Capillary: 120 mg/dL — ABNORMAL HIGH (ref 70–99)
Glucose-Capillary: 135 mg/dL — ABNORMAL HIGH (ref 70–99)
Glucose-Capillary: 146 mg/dL — ABNORMAL HIGH (ref 70–99)

## 2024-04-23 LAB — COMPREHENSIVE METABOLIC PANEL WITH GFR
ALT: 46 U/L — ABNORMAL HIGH (ref 0–44)
AST: 32 U/L (ref 15–41)
Albumin: 2.2 g/dL — ABNORMAL LOW (ref 3.5–5.0)
Alkaline Phosphatase: 67 U/L (ref 38–126)
Anion gap: 6 (ref 5–15)
BUN: 30 mg/dL — ABNORMAL HIGH (ref 8–23)
CO2: 21 mmol/L — ABNORMAL LOW (ref 22–32)
Calcium: 7.7 mg/dL — ABNORMAL LOW (ref 8.9–10.3)
Chloride: 107 mmol/L (ref 98–111)
Creatinine, Ser: 0.8 mg/dL (ref 0.61–1.24)
GFR, Estimated: >60 mL/min (ref >60)
Glucose, Bld: 168 mg/dL — ABNORMAL HIGH (ref 70–99)
Potassium: 4.3 mmol/L (ref 3.5–5.1)
Sodium: 134 mmol/L — ABNORMAL LOW (ref 135–145)
Total Bilirubin: 1.7 mg/dL — ABNORMAL HIGH (ref 0.0–1.2)
Total Protein: 5.4 g/dL — ABNORMAL LOW (ref 6.5–8.1)

## 2024-04-23 LAB — CBC
HCT: 26 % — ABNORMAL LOW (ref 39.0–52.0)
Hemoglobin: 8 g/dL — ABNORMAL LOW (ref 13.0–17.0)
MCH: 25.6 pg — ABNORMAL LOW (ref 26.0–34.0)
MCHC: 30.8 g/dL (ref 30.0–36.0)
MCV: 83.1 fL (ref 80.0–100.0)
Platelets: 127 10*3/uL — ABNORMAL LOW (ref 150–400)
RBC: 3.13 MIL/uL — ABNORMAL LOW (ref 4.22–5.81)
RDW: 21.1 % — ABNORMAL HIGH (ref 11.5–15.5)
WBC: 10.5 10*3/uL (ref 4.0–10.5)
nRBC: 0.8 % — ABNORMAL HIGH (ref 0.0–0.2)

## 2024-04-23 LAB — OCCULT BLOOD X 1 CARD TO LAB, STOOL: Fecal Occult Bld: POSITIVE — AB

## 2024-04-23 LAB — HEPARIN LEVEL (UNFRACTIONATED): Heparin Unfractionated: 0.27 [IU]/mL — ABNORMAL LOW (ref 0.30–0.70)

## 2024-04-23 MED ORDER — ENSURE ENLIVE PO LIQD
237.0000 mL | Freq: Two times a day (BID) | ORAL | Status: DC
  Administered 2024-04-23 – 2024-04-26 (×5): 237 mL via ORAL

## 2024-04-23 MED ORDER — PANTOPRAZOLE SODIUM 40 MG PO TBEC
40.0000 mg | DELAYED_RELEASE_TABLET | Freq: Two times a day (BID) | ORAL | Status: DC
  Administered 2024-04-23 – 2024-04-30 (×15): 40 mg via ORAL
  Filled 2024-04-23 (×15): qty 1

## 2024-04-23 MED ORDER — ADULT MULTIVITAMIN W/MINERALS CH
1.0000 | ORAL_TABLET | Freq: Every day | ORAL | Status: DC
Start: 2024-04-23 — End: 2024-04-30
  Administered 2024-04-23 – 2024-04-30 (×8): 1 via ORAL
  Filled 2024-04-23 (×8): qty 1

## 2024-04-23 MED ORDER — LEVOTHYROXINE SODIUM 50 MCG PO TABS
50.0000 ug | ORAL_TABLET | Freq: Every day | ORAL | Status: DC
  Administered 2024-04-23 – 2024-04-30 (×8): 50 ug via ORAL
  Filled 2024-04-23: qty 2
  Filled 2024-04-23 (×4): qty 1
  Filled 2024-04-23: qty 2
  Filled 2024-04-23 (×2): qty 1

## 2024-04-23 MED ORDER — METOPROLOL TARTRATE 25 MG PO TABS
25.0000 mg | ORAL_TABLET | Freq: Two times a day (BID) | ORAL | Status: DC
Start: 2024-04-23 — End: 2024-04-24
  Administered 2024-04-23: 25 mg via ORAL
  Filled 2024-04-23: qty 1

## 2024-04-23 MED ORDER — ACETAMINOPHEN 325 MG PO TABS
650.0000 mg | ORAL_TABLET | Freq: Four times a day (QID) | ORAL | Status: DC | PRN
  Administered 2024-04-29: 650 mg via ORAL
  Filled 2024-04-23 (×3): qty 2

## 2024-04-23 MED ORDER — STERILE WATER FOR INJECTION IV SOLN
INTRAMUSCULAR | Status: AC
  Filled 2024-04-23: qty 1224

## 2024-04-23 MED ORDER — BENZONATATE 100 MG PO CAPS
100.0000 mg | ORAL_CAPSULE | Freq: Three times a day (TID) | ORAL | Status: DC
  Administered 2024-04-23 – 2024-04-27 (×12): 100 mg via ORAL
  Filled 2024-04-23 (×12): qty 1

## 2024-04-23 MED ORDER — FUROSEMIDE 10 MG/ML IJ SOLN
40.0000 mg | Freq: Every day | INTRAMUSCULAR | Status: DC
  Administered 2024-04-23 – 2024-04-25 (×3): 40 mg via INTRAVENOUS
  Filled 2024-04-23 (×3): qty 4

## 2024-04-23 MED ORDER — HEPARIN (PORCINE) 25000 UT/250ML-% IV SOLN
1650.0000 [IU]/h | INTRAVENOUS | Status: DC
  Administered 2024-04-23: 1500 [IU]/h via INTRAVENOUS
  Administered 2024-04-24 (×2): 1600 [IU]/h via INTRAVENOUS
  Administered 2024-04-26: 1700 [IU]/h via INTRAVENOUS
  Filled 2024-04-23 (×5): qty 250

## 2024-04-23 NOTE — Progress Notes (Signed)
 Occupational Therapy Treatment Patient Details Name: Donald Morales MRN: [id number] DOB: [birth year] Today's Date: 04/23/2024   History of present illness 68 y/o gentleman  who presented with necrotic bowel due to venous obstruction.  On 5/11 he he required resection of necrotic bowel and was left in discontinuity.  On 5/12 he returned to the OR with a small residual area of necrotic tissue removed and was reconnected.  Wound VAC in place.  Transferred back to the ICU for mechanical ventilation. Now NPO with ng tube.  Pt with a history of A-fib not on anticoagulation   OT comments  Patient seen for skilled OT session this am. Patient eager and open to all presented activity. Needs +2 for lines and safety with progression of hallway amb with RW (see PT note for distance and assist level) and now with NG tube removed self feeding full liquid diet with set up and supervision. Light BADL's bed, seated and standing level, progressing LB reach, modified BORG scale post visit 8/10 with report of fatigue and weakness overall. OT will continue to follow while in Acute hospital setting. Patient will benefit from intensive inpatient follow-up therapy, >3 hours/day.         If plan is discharge home, recommend the following:  Two people to help with walking and/or transfers;Two people to help with bathing/dressing/bathroom;Assistance with cooking/housework;Assistance with feeding;Direct supervision/assist for medications management;Direct supervision/assist for financial management;Assist for transportation;Help with stairs or ramp for entrance;Supervision due to cognitive status   Equipment Recommendations  Other (comment) (TBD at next venue- if home shower chair and RW)       Precautions / Restrictions Precautions Precautions: Other (comment) Recall of Precautions/Restrictions: Impaired Precaution/Restrictions Comments: abdominal incision with VAC; reviewed log roll Restrictions Weight Bearing  Restrictions Per Provider Order: No       Mobility Bed Mobility Overal bed mobility: Needs Assistance Bed Mobility: Rolling, Sidelying to Sit Rolling: Min assist Sidelying to sit: Min assist       General bed mobility comments: VCs log roll technique, min A to raise trunk    Transfers Overall transfer level: Needs assistance Equipment used: Rolling walker (2 wheels) Transfers: Sit to/from Stand, Bed to chair/wheelchair/BSC Sit to Stand: +2 safety/equipment, Min assist     Step pivot transfers: Min assist, +2 safety/equipment     General transfer comment: see PT note for hallway distances wirth RW and + 2 for lines management     Balance Overall balance assessment: Needs assistance Sitting-balance support: Feet supported Sitting balance-Leahy Scale: Good     Standing balance support: Bilateral upper extremity supported, During functional activity, Reliant on assistive device for balance Standing balance-Leahy Scale: Poor Standing balance comment: approaching Fair this session                           ADL either performed or assessed with clinical judgement   ADL Overall ADL's : Needs assistance/impaired Eating/Feeding: Sitting;Set up (full liquid diet wit cup to mouth with set up, NG tube out)   Grooming: Wash/dry hands;Wash/dry face;Set up;Sitting           Upper Body Dressing : Minimal assistance;Sitting   Lower Body Dressing: Moderate assistance;Sit to/from stand               Functional mobility during ADLs: +2 for physical assistance;+2 for safety/equipment;Minimal assistance General ADL Comments: modified BORG scale for tasks completed including RW amb with 8/10 RPE    Extremity/Trunk Assessment Upper Extremity  Assessment Upper Extremity Assessment: Generalized weakness   Lower Extremity Assessment Lower Extremity Assessment: Generalized weakness                 Communication Communication Communication: No apparent  difficulties   Cognition Arousal: Alert Behavior During Therapy: WFL for tasks assessed/performed Cognition: Cognition impaired   Orientation impairments: Time Awareness: Online awareness impaired Memory impairment (select all impairments): Short-term memory Attention impairment (select first level of impairment): Sustained attention Executive functioning impairment (select all impairments): Problem solving OT - Cognition Comments: signinificant improvement in processing with higher level processing deficits noted                 Following commands: Intact Following commands impaired: Follows multi-step commands with increased time      Cueing   Cueing Techniques: Verbal cues, Tactile cues  Exercises Exercises: Hand exercises (issued and trained in B foam grasp squeeze ball for 10 reps B hand grasp q hr)       General Comments NG tube out, HR improved between 104-113 this session    Pertinent Vitals/ Pain       Pain Assessment Pain Assessment: 0-10 Pain Score: 2  Pain Location: abdomen with movement Pain Descriptors / Indicators: Grimacing Pain Intervention(s): Monitored during session, Repositioned   Frequency  Min 2X/week        Progress Toward Goals  OT Goals(current goals can now be found in the care plan section)  Progress towards OT goals: Progressing toward goals  Acute Rehab OT Goals Patient Stated Goal: to get stronger OT Goal Formulation: With patient Time For Goal Achievement: 05/02/24 Potential to Achieve Goals: Good ADL Goals Pt Will Perform Grooming: with supervision;sitting Pt Will Perform Lower Body Bathing: with contact guard assist;sitting/lateral leans;sit to/from stand;with adaptive equipment Pt Will Perform Lower Body Dressing: with contact guard assist;with adaptive equipment;sitting/lateral leans;sit to/from stand Pt Will Transfer to Toilet: with min assist;ambulating;bedside commode;grab bars;regular height toilet Pt Will Perform  Toileting - Clothing Manipulation and hygiene: with contact guard assist;sitting/lateral leans;sit to/from stand Pt Will Perform Tub/Shower Transfer: with contact guard assist;shower seat;rolling walker Pt/caregiver will Perform Home Exercise Program: Increased strength;Both right and left upper extremity;Independently;With written HEP provided Additional ADL Goal #1: patient to demonstrate good safety awareness by answering 4/4 safety questions from the [facility] correctly: 1. What do you do for yourself if you are sick with a cold? 2. What do you do if you burn yourself and the wound becomes infected? 3. What do you do if you experience severe chest pain and shortness of breath? 4. What number do you call in an emergency? In order to determine best discharge plan  Plan      Co-evaluation    PT/OT/SLP Co-Evaluation/Treatment: Yes Reason for Co-Treatment: For patient/therapist safety;To address functional/ADL transfers PT goals addressed during session: Mobility/safety with mobility;Balance;Proper use of DME OT goals addressed during session: ADL's and self-care;Strengthening/ROM      AM-PAC OT "6 Clicks" Daily Activity     Outcome Measure   Help from another person eating meals?: A Little (now on full liquid) Help from another person taking care of personal grooming?: A Little Help from another person toileting, which includes using toliet, bedpan, or urinal?: A Lot Help from another person bathing (including washing, rinsing, drying)?: A Lot Help from another person to put on and taking off regular upper body clothing?: A Little Help from another person to put on and taking off regular lower body clothing?: A Lot 6 Click Score: 15  End of Session Equipment Utilized During Treatment: Gait belt;Rolling walker (2 wheels)  OT Visit Diagnosis: Unsteadiness on feet (R26.81);Muscle weakness (generalized) (M62.81);Feeding difficulties (R63.3);Cognitive communication deficit (R41.841)    Activity Tolerance Patient limited by fatigue   Patient Left in chair;with call bell/phone within reach;with chair alarm set;with family/visitor present   Nurse Communication Mobility status        Time: 0981-1914 OT Time Calculation (min): 32 min  Charges: OT General Charges $OT Visit: 1 Visit OT Treatments $Self Care/Home Management : 8-22 mins  Caera Enwright OT/L Acute Rehabilitation Department  [phone]  04/23/2024, 1:26 PM

## 2024-04-23 NOTE — Progress Notes (Signed)
 PHARMACY - ANTICOAGULATION CONSULT NOTE  Pharmacy Consult for Heparin  Indication: atrial fibrillation, SMV thrombosis  Allergies  Allergen Reactions   Aspirin     81 mg - epistaxis/gums bleeding   Codeine     hallucinations    Patient Measurements: Height: 5\' 9"  (175.3 cm) Weight: 87 kg (191 lb 12.8 oz) IBW/kg (Calculated) : 70.7 HEPARIN  DW (KG): 88.5  Vital Signs: Temp: 98.6 F (37 C) (05/21 0800) Temp Source: Oral (05/21 0800) BP: 113/73 (05/21 0600) Pulse Rate: 76 (05/21 0600)  Labs: Recent Labs    04/20/24 1210 04/21/24 0242 04/21/24 0242 04/21/24 1224 04/22/24 0247 04/23/24 0524  HGB  --  7.0*   < > 7.0* 8.5* 8.0*  HCT  --  23.9*   < > 23.5* 28.0* 26.0*  PLT  --  190  --   --  152 127*  HEPARINUNFRC 0.59 0.58  --   --   --   --   CREATININE  --  0.86  --   --  0.70 0.80   < > = values in this interval not displayed.    Estimated Creatinine Clearance (by C-G formula based on SCr of 0.8 mg/dL) Male: 09.8 mL/min Male: 100.5 mL/min   Medical History: Past Medical History:  Diagnosis Date   BPH (benign prostatic hyperplasia)    History of epistaxis    Hyperlipidemia    Muscle ache    Persistent atrial fibrillation (HCC)    Premature ventricular contraction    RBBB    RBBB    Thyroid disease    Typical atrial flutter (HCC)     Medications:  Infusions:   amiodarone  30 mg/hr (04/23/24 0144)   piperacillin -tazobactam (ZOSYN )  IV 3.375 g (04/23/24 0515)   TPN ADULT (ION) 85 mL/hr at 04/23/24 0144    Assessment: Donald Morales admitted on 5/11 with abdominal pain and AFib with RVR.  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  PMH significant for AFib, but has been off medications/anticoagulation for past 2 years.  Pharmacy is consulted to dose heparin  IV.  Significant events: - 5/12 Heparin  held at 4am for surgery, resumed on 5/12 at 19:00 - 5/19 Holding heparin  infusion for suspected upper GI bleed - 5/21 Heparin  to resume per Dr. Gwynneth Lessen  Today,  04/23/2024: CBC:  Hgb decreased to 8 (5/19 Hgb 7, 5/20 Hgb up to 8.5 s/p transfusions 5/19 and 5/20).  Plt decreased to 127k  RN and MD: no acute bleeding or complications reported.  RN notes 3 large BM overnight were dark maroon, but no overt bright red blood (likely old blood passing from upper GI per MD).    Goal of Therapy:  Heparin  level 0.3-0.5 units/ml Monitor platelets by anticoagulation protocol: Yes   Plan:  Resume heparin  IV at 1500 units/hr No bolus d/t recent bleeding  Heparin  level in 6 hours Daily heparin  level and CBC Monitor for signs and symptoms of bleeding   Kendall Pauls PharmD, BCPS [facility] main pharmacy [phone] 04/23/2024 9:14 AM

## 2024-04-23 NOTE — Progress Notes (Addendum)
 Patient Name: Donald Morales Date of Encounter: 04/23/2024 [facility] Cardiologist: None   Interval Summary  .    Heparin  restarted today, hemoglobin 8.  Down from 8.5.  Platelets 127.  Still having some dark stool but thought to be residual.  Okay to continue per primary team.  Having nonproductive cough.  Vital Signs .    Vitals:   04/23/24 0800 04/23/24 0900 04/23/24 1000 04/23/24 1100  BP: 124/73 127/76 123/67   Pulse: 78 (!) 109 78   Resp: 16 19 19  (!) 32  Temp: 98.6 F (37 C)     TempSrc: Oral     SpO2: 97% 95% 97%   Weight:      Height:        Intake/Output Summary (Last 24 hours) at 04/23/2024 1151 Last data filed at 04/23/2024 0945 Gross per 24 hour  Intake 3065.47 ml  Output 1075 ml  Net 1990.47 ml      04/23/2024    5:00 AM 04/22/2024    5:00 AM 04/21/2024    3:49 AM  Last 3 Weights  Weight (lbs) 191 lb 12.8 oz 190 lb 14.7 oz 187 lb 2.7 oz  Weight (kg) 87 kg 86.6 kg 84.9 kg      Telemetry/ECG    Atrial fibrillation heart rates have generally been in the 80s, did walk today but heart rates around 110 currently.- Personally Reviewed  CV Studies    Limited echocardiogram 04/13/2024  1. Left ventricular ejection fraction, by estimation, is 30 to 35%. The  left ventricle has moderately decreased function. The left ventricle  demonstrates regional wall motion abnormalities (see scoring  diagram/findings for description). The left  ventricular internal cavity size was mildly dilated.   2. Right ventricular systolic function is moderately reduced. The right  ventricular size is moderately enlarged.   3. Moderate to severe mitral valve regurgitation.   4. Tricuspid valve regurgitation is moderate.   5. The aortic valve is tricuspid. Aortic valve regurgitation is not  visualized. No aortic stenosis is present.   Physical Exam .   GEN: No acute distress.   Neck: No JVD Cardiac: Irregularly irregular, 2 out of 6 murmur Respiratory: Clear to  auscultation bilaterally. GI: Soft, nontender, non-distended  MS: No edema  Patient Profile    Donald Morales is a 68 y.o. adult has hx of longstanding persistent atrial fibrillation/flutter, HFrEF, hypertension, hyperlipidemia, hypothyroidism.  Patient admitted for complaints of abdominal pain, diagnosed with SMV thrombosis with small bowel ischemia, bacterial translocation and septic shock.  He had surgical resection of the bowel 5/11 and 5/12.  Cardiology asked to see due to atrial fibrillation/CHF  Assessment & Plan .     Longstanding persistent atrial fibrillation Question whether this is related to ischemic MR.  Fortunately heart rates controlled generally in the 80s.  Within the past couple hours after walking though have been around 100-110.  Asymptomatic. He has been on IV amiodarone  since 5/13 for rate control.  Able to take p.o. now, will discuss with MD plans for ongoing use/dosing depending on plan.  Stop IV metoprolol  2.5 mg every 6 and start Lopressor  25 mg twice daily.  If we need to titrate up can change frequency. On IV heparin , hemoglobin is 8 today.  Acute bleeding felt to have resolved.  Platelets 127.  Need to watch this closely.  Reasonable to continue heparin  today to see if he tolerates, also patient still hesitant about going on DOAC.  We will discuss  this more tomorrow. TSH almost 12 and on synthroid .   Chronic HFrEF Limited echocardiogram this admission shows EF 30 to 35% with wall motion abnormalities in the inferior, anterior, lateral walls.  These wall motion abnormalities are not new though, also documented in March 2023 echo.   RV is moderately reduced.  Overall looks euvolemic.  CVP yesterday was 4. Lasix  held.  CVP 10 today, will give dose of IV lasix  Will titrate GDMT more when he is more stable.  Cardiomyopathy could be tachycardia induced but will need to rule out obstructive CAD once recovers from GI issues  Moderate to severe MR This is a new finding  based off of echocardiogram this admission.  Likely functional in setting of systolic dysfunction.  SMV thrombosis Small bowel ischemia Anemia Management per surgery.  NG tube with very dark fluid.  Hemoglobin dropped from 9.5-7 on 5/19, received 1 unit PRBCs.  Restarted on heparin  drip today, further management per primary team.  NG tube removed today. For questions or updates, please contact [facility] Please consult [email or web address] for contact info under       Signed, Burnetta Cart, PA-C     Patient seen and examined.  Agree with above documentation.  On exam, patient is alert and oriented, irregular rhythm, normal rate, no murmurs, lungs CTAB, right IJ CVC in place.  He is able to take p.o. now, will start metoprolol  25 mg twice daily.  Plan to titrate metoprolol  and wean off amiodarone  drip.  Hemoglobin 8.0, okay per surgery to start heparin  drip today.  CVP 10, will give dose of IV lasix .  Wendie Hamburg, MD

## 2024-04-23 NOTE — Progress Notes (Signed)
 PHARMACY - TOTAL PARENTERAL NUTRITION CONSULT NOTE   Indication: bowel obstruction  Patient Measurements: Height: 5\' 9"  (175.3 cm) Weight: 87 kg (191 lb 12.8 oz) IBW/kg (Calculated) : 70.7 TPN AdjBW (KG): 75.2 Body mass index is 28.32 kg/m. Usual Weight:   Assessment:  23 yoM admitted on 5/11 with abdominal pain and AFib with RVR and concern of SMV thrombosis.  S/p small bowel resection x2 with anastomosis and abdominal wall closure on 5/12.  Pharmacy is consulted to dose TPN.  Glucose / Insulin : No hx DM noted.  A1c 6.3 - 5/10 Dexamethasone  10mg , 5/12 Dexamethasone  5mg  - CBGs < 180 (range 124-142) on mSSI (12 units/ 24 hrs) Electrolytes: Elytes WNL except Na down to 134, CO2 21 Renal: SCr WNL, BUN 30 Hepatic: LFTs continue to trend down. Tbili down to 1.7 - TG 54 (5/18) Intake / Output; MIVF:  - Intake: no mIVF - Output: UOP 700 mL, NG 400 mL (NG removed 5/21), Drains 75 mL - RN reported BM x3 overnight  GI Imaging: GI Surgeries / Procedures:  - 5/11 OR:  lap > open for necrotic small bowel w/ 40cm small bowel resected and notable mesenteric vein thromboses. Left in discontinuity. - 5/12 OR: 2nd look, 40 cm small bowel resection, anastomosis, abdominal wall closure  Central access: CVC 5/11 TPN start date: 5/13  Nutritional Goals: Goal TPN rate is 85 mL/hr and provides 122 g of protein and 1871 kcals per day.  RD Assessment:  Estimated Needs Total Energy Estimated Needs: 2000-2250 kcals Total Protein Estimated Needs: 110-125 grams Total Fluid Estimated Needs: >/= 2L  Current Nutrition:  ADAT to full liquids, TPN  Plan: Continue TPN at 85 mL/hr at 1800 Electrolytes in TPN:  Na 140 mEq/L  K 60 mEq/L Ca 8 mEq/L Mg 75mEq/L  Phos 20 mmol/L Cl:Ac 1:1 Change MVI/TE to oral Continue moderate SSI and adjust as needed  No IVF per MD. Monitor TPN labs on Mon/Thurs and prn Follow up ability to tolerate >/= 60% of full liquid diet and wean/stop TPN   Kendall Pauls  PharmD, BCPS [facility] main pharmacy [phone] 04/23/2024 7:37 AM

## 2024-04-23 NOTE — Plan of Care (Signed)

## 2024-04-23 NOTE — Progress Notes (Signed)
 PHARMACY - ANTICOAGULATION CONSULT NOTE  Pharmacy Consult for Heparin  Indication: atrial fibrillation, SMV thrombosis  Allergies  Allergen Reactions   Aspirin     81 mg - epistaxis/gums bleeding   Codeine     hallucinations    Patient Measurements: Height: 5\' 9"  (175.3 cm) Weight: 87 kg (191 lb 12.8 oz) IBW/kg (Calculated) : 70.7 HEPARIN  DW (KG): 88.5  Vital Signs: Temp: 98.3 F (36.8 C) (05/21 1640) Temp Source: Oral (05/21 1640) BP: 138/90 (05/21 1714) Pulse Rate: 102 (05/21 1714)  Labs: Recent Labs    04/21/24 0242 04/21/24 1224 04/22/24 0247 04/23/24 0524 04/23/24 1842  HGB 7.0* 7.0* 8.5* 8.0*  --   HCT 23.9* 23.5* 28.0* 26.0*  --   PLT 190  --  152 127*  --   HEPARINUNFRC 0.58  --   --   --  0.27*  CREATININE 0.86  --  0.70 0.80  --     Estimated Creatinine Clearance (by C-G formula based on SCr of 0.8 mg/dL) Male: 16.1 mL/min Male: 100.5 mL/min   Medical History: Past Medical History:  Diagnosis Date   BPH (benign prostatic hyperplasia)    History of epistaxis    Hyperlipidemia    Muscle ache    Persistent atrial fibrillation (HCC)    Premature ventricular contraction    RBBB    RBBB    Thyroid disease    Typical atrial flutter (HCC)     Medications:  Infusions:   amiodarone  30 mg/hr (04/23/24 1759)   heparin  1,500 Units/hr (04/23/24 1759)   TPN ADULT (ION) 85 mL/hr at 04/23/24 1759    Assessment: 67 yoM admitted on 5/11 with abdominal pain and AFib with RVR.  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  PMH significant for AFib, but has been off medications/anticoagulation for past 2 years.  Pharmacy is consulted to dose heparin  IV.  Significant events: - 5/12 Heparin  held at 4am for surgery, resumed on 5/12 at 19:00 - 5/19 Holding heparin  infusion for suspected upper GI bleed - 5/21 Heparin  to resume per Dr. Gwynneth Lessen  Today, 04/23/2024: 1st heparin  level since restarted today is slightly SUBtherapeutic on current IV heparin  rate of  1500 units/hr CBC:  Hgb decreased to 8 (5/19 Hgb 7, 5/20 Hgb up to 8.5 s/p transfusions 5/19 and 5/20).  Plt decreased to 127k  Per RN, patient reports had BM with some blood around 3pm today and of note heparin  was started around noon, but patient reports he has not had any further BMs since then  Goal of Therapy:  Heparin  level 0.3-0.5 units/ml Monitor platelets by anticoagulation protocol: Yes   Plan:  Increase IV heparin  from 1500 to 1600 units/hr No bolus d/t recent bleeding  Recheck heparin  level in 6 hours Daily heparin  level and CBC Monitor for signs and symptoms of bleeding and RN to notify pharmacy and/or Md if bleeding worsens   Luise Saint, PharmD, BCPS Secure Chat if ?s 04/23/2024 7:34 PM

## 2024-04-23 NOTE — Progress Notes (Signed)
 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  On 5/11 he he required resection of necrotic bowel and was left in discontinuity.  On 5/12 he returned to the OR with a small residual area of necrotic tissue removed and was reconnected.  Wound VAC in place.  Transferred back to the ICU for mechanical ventilation. Now NPO with ng tube.  Pt with a history of A-fib not on anticoagulation    PT Comments   Patient making steady progress, ambulated x 400' with RW and short distance in room without. Patient may progress to return home if remains in hospital several more days butt will require much support.  Patient will benefit from continued inpatient follow up therapy, <3 hours/day    If plan is discharge home, recommend the following: A little help with walking and/or transfers;A little help with bathing/dressing/bathroom;Assistance with cooking/housework;Assist for transportation;Help with stairs or ramp for entrance   Can travel by private vehicle        Equipment Recommendations  Rolling walker (2 wheels)    Recommendations for Other Services       Precautions / Restrictions Precautions Precaution/Restrictions Comments: abdominal incision with VAC; reviewed log roll     Mobility  Bed Mobility   Bed Mobility: Rolling, Sidelying to Sit Rolling: Min assist, Used rails Sidelying to sit: HOB elevated, Used rails       General bed mobility comments: VCs log roll technique, min A to raise trunk    Transfers Overall transfer level: Needs assistance Equipment used: Rolling walker (2 wheels) Transfers: Sit to/from Stand Sit to Stand: Min assist           General transfer comment: cues for hand placement    Ambulation/Gait Ambulation/Gait assistance: Contact guard assist Gait Distance (Feet):  400 Feet Assistive device: Rolling walker (2 wheels) (then 15' with no device) Gait Pattern/deviations: Step-through pattern, Decreased stride length Gait velocity: decr     General Gait Details: steady, no loss of balance, HR 116 walking, +2 to manage lines/VAC   Stairs             Wheelchair Mobility     Tilt Bed    Modified Rankin (Stroke Patients Only)       Balance Overall balance assessment: Needs assistance Sitting-balance support: Feet supported Sitting balance-Leahy Scale: Normal     Standing balance support: Bilateral upper extremity supported, During functional activity, Reliant on assistive device for balance Standing balance-Leahy Scale: Fair                              Hotel manager: No apparent difficulties  Cognition Arousal: Alert Behavior During Therapy: WFL for tasks assessed/performed   PT - Cognitive impairments: No apparent impairments                                Cueing    Exercises      General Comments General comments (skin integrity, edema, etc.): NG tube out, HR improved between 104-113 this session      Pertinent Vitals/Pain Pain Assessment Pain Assessment: No/denies pain    Home Living  Prior Function            PT Goals (current goals can now be found in the care plan section) Progress towards PT goals: Progressing toward goals    Frequency    Min 3X/week      PT Plan      Co-evaluation PT/OT/SLP Co-Evaluation/Treatment: Yes Reason for Co-Treatment: For patient/therapist safety;To address functional/ADL transfers PT goals addressed during session: Mobility/safety with mobility;Balance;Proper use of DME OT goals addressed during session: ADL's and self-care;Strengthening/ROM      AM-PAC PT "6 Clicks" Mobility   Outcome Measure  Help needed turning from your back to your side while in a flat bed without using  bedrails?: A Little Help needed moving from lying on your back to sitting on the side of a flat bed without using bedrails?: A Little Help needed moving to and from a bed to a chair (including a wheelchair)?: A Little Help needed standing up from a chair using your arms (e.g., wheelchair or bedside chair)?: A Little Help needed to walk in hospital room?: A Little Help needed climbing 3-5 steps with a railing? : A Lot 6 Click Score: 17    End of Session Equipment Utilized During Treatment: Gait belt Activity Tolerance: Patient tolerated treatment well Patient left: in chair;with chair alarm set;with call bell/phone within reach Nurse Communication: Mobility status PT Visit Diagnosis: Difficulty in walking, not elsewhere classified (R26.2);Pain;Other abnormalities of gait and mobility (R26.89)     Time: 1914-7829 PT Time Calculation (min) (ACUTE ONLY): 28 min  Charges:    $Gait Training: 8-22 mins PT General Charges $$ ACUTE PT VISIT: 1 Visit                     Abelina Hoes PT Acute Rehabilitation Services Office [phone] Weekend pager-[phone]    Dareen Ebbing 04/23/2024, 2:32 PM

## 2024-04-23 NOTE — Progress Notes (Addendum)
 04/23/2024  Donald Morales [id number] [birth year]  CARE TEAM: PCP: Rae Bugler, MD  Outpatient Care Team: Patient Care Team: Rae Bugler, MD as PCP - General (Family Medicine) Mealor, Donnamae Gaba, MD as PCP - Electrophysiology (Cardiology) Dorsey Gault, MD as Consulting Physician (Cardiology)  Inpatient Treatment Team: Treatment Team:  Hoyt Macleod, MD Ccs, Md, MD Lbcardiology, Zola Hint, MD Fayne Hoover, RN Calista Catching, RN Lady Pier, MD Thresea Flor, PT Layvonne Primas, [place] Merrill Abide, OT Tera Fellows, RN Trenda Frisk, RN   Problem List:   Principal Problem:   Ischemic bowel disease [facility]) Active Problems:   Chronic systolic heart failure (HCC)   Acute blood loss anemia   04/14/2024  Preoperative diagnosis: 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   Postoperative diagnosis: Same   Procedure: Exploratory laparotomy with resection of approximately 40 cm of ileum with primary anastomosis and closure of abdomen and placement of subcutaneous wound VAC   Surgeon: Sim Dryer, MD   Assistant: Berkeley Breath, PA-C   04/13/2024  Surgeons and Role: Edmon Gosling, MD - Primary   Preoperative Diagnosis: Bowel ischemia   Postoperative Diagnosis: small bowel ischemia   Procedure(s) Performed:   - Foley catheter placement - Diagnostic laparoscopy - Exploratory laparotomy - Small bowel resection    Assessment [facility] Stay = 10 days) 9 Days Post-Op    SMV thrombosis and Small bowel ischemia   5/12:  Ex-lap, small bowel resection, open abdominal vac - Dr. Ramiro Burly   5/11:  ex-lap, resection 40 cm ileum with primary anastomosis and closure of abdomen - Dr. Afton Horse  Ileus resolving.    Plan:  - NG removed but recurrent nausea and vomiting.  NG tube replaced 5/17. - Tolerating clamping trials with clear liquids.  Okay to remove NGT and advance to full liquid/dysphagia 1 diet.  Go  slowly with him since I worry he could rush his diet and backfire again - Palliate sore throat better with scheduled Magic mouthwash.  Add HurriCaine spray and lozenges as well to help out.   - continue TPN to avoid malnutrition.  Can consider weaning if tolerates p.o. better today.  If concerns, can consider calorie counts - mobilize as tolerated  - VAC changes Mon Thu  FEN: TPN, d/c NGT & ADAT full liquids 5/21. VTE: Anticoagulation to avoid repeat embolization per main service.   Hemoglobin improved with transfusion 5/19.   Hemoglobin appears to be stable last 48 hours,  so okay to restart full anticoagulation from surgery standpoint 5/21 AM.   PO route vs gtt per primary service  ID: [email or web address]  5/10>5/16   - per CCM -  HFrEF HTN Thyroid disease A. Fib not on anticoagulation  I updated the patient's status to the patient and nurse  Recommendations were made.  Questions were answered.  They expressed understanding & appreciation.        I reviewed nursing notes, Consultant Cardiology notes, hospitalist notes, last 24 h vitals and pain scores, last 48 h intake and output, last 24 h labs and trends, and last 24 h imaging results.  I have reviewed this patient's available data, including medical history, events of note, test results, etc as part of my evaluation.   A significant portion of that time was spent in counseling. Care during the described time interval was provided by me.  This care required moderate level of medical decision making.  04/23/2024    Subjective: (Chief complaint)  Tolerating clamping  trial with liquids.  Moving bowels sitting on commode.  No bleeding issues.  ICU nurse just outside room  Objective:  Vital signs:  Vitals:   04/22/24 2315 04/23/24 0340 04/23/24 0500 04/23/24 0600  BP:   138/74 113/73  Pulse:    76  Resp:   20 18  Temp: 100.2 F (37.9 C) 98.4 F (36.9 C)    TempSrc: Axillary Oral    SpO2:    95%  Weight:   87 kg    Height:        Last BM Date :  (PTA)  Intake/Output   Yesterday:  05/20 0701 - 05/21 0700 In: 3374.1 [P.O.:450; I.V.:2774.4; IV Piggyback:149.7] Out: 1175 [Urine:700; Emesis/NG output:400; Drains:75] This shift:  No intake/output data recorded.  Bowel function:  Flatus: YES  BM: Not documented but patient claims he had 1.  Drain: Nasogastric tube with dark green mildly thick effluent in canister   Physical Exam:  General: Pt awake/alert in no acute distress.  Sitting up in commode Eyes: PERRL, normal EOM.  Sclera clear.  No icterus Neuro: CN II-XII intact w/o focal sensory/motor deficits. Lymph: No head/neck/groin lymphadenopathy Psych:  No delerium/psychosis/paranoia.  Oriented x 4 HENT: Normocephalic, Mucus membranes moist.  No thrush Neck: Supple, No tracheal deviation.  No obvious thyromegaly Chest: No pain to chest wall compression.  Good respiratory excursion.  No audible wheezing CV:  Pulses intact.  Regular rhythm.  No major extremity edema MS: Normal AROM mjr joints.  No obvious deformity  Abdomen: Soft.  Nondistended.  Nontender.  Midline incision with black sponge wound VAC in place with serosanguineous drainage.  No evidence of peritonitis.    Ext:   No deformity.  No mjr edema.  No cyanosis Skin: No petechiae / purpurea.  No major sores.  Warm and dry    Results:   Cultures: Recent Results (from the past 720 hours)  MRSA Next Gen by PCR, Nasal     Status: None   Collection Time: 04/13/24  5:52 AM   Specimen: Nasal Mucosa; Nasal Swab  Result Value Ref Range Status   MRSA by PCR Next Gen NOT DETECTED NOT DETECTED Final    Comment: (NOTE) The GeneXpert MRSA Assay (FDA approved for NASAL specimens only), is one component of a comprehensive MRSA colonization surveillance program. It is not intended to diagnose MRSA infection nor to guide or monitor treatment for MRSA infections. Test performance is not FDA approved in patients less than 71  years old. Performed at [facility], [address], [place], [place] [address]   MRSA Next Gen by PCR, Nasal     Status: None   Collection Time: 04/15/24 10:04 AM   Specimen: Nasal Mucosa; Nasal Swab  Result Value Ref Range Status   MRSA by PCR Next Gen NOT DETECTED NOT DETECTED Final    Comment: (NOTE) The GeneXpert MRSA Assay (FDA approved for NASAL specimens only), is one component of a comprehensive MRSA colonization surveillance program. It is not intended to diagnose MRSA infection nor to guide or monitor treatment for MRSA infections. Test performance is not FDA approved in patients less than 71 years old. Performed at [facility], [address] [place], [place] [address]     Labs: Results for orders placed or performed during the hospital encounter of 04/12/24 (from the past 48 hours)  Lactic acid, plasma     Status: None   Collection Time: 04/21/24  9:05 AM  Result Value Ref Range  Lactic Acid, Venous 1.0 0.5 - 1.9 mmol/L    Comment: Performed at [facility], [address], [place], [place] [address]  Glucose, capillary     Status: Abnormal   Collection Time: 04/21/24 12:11 PM  Result Value Ref Range   Glucose-Capillary 113 (H) 70 - 99 mg/dL    Comment: Glucose reference range applies only to samples taken after fasting for at least 8 hours.   Comment 1 Notify RN    Comment 2 Document in Chart   Hemoglobin and hematocrit, blood     Status: Abnormal   Collection Time: 04/21/24 12:24 PM  Result Value Ref Range   Hemoglobin 7.0 (L) 13.0 - 17.0 g/dL   HCT 60.4 (L) 54.0 - 98.1 %    Comment: Performed at [facility], [address] [place], [place] [address]  Glucose, capillary     Status: Abnormal   Collection Time: 04/21/24  5:04 PM  Result Value Ref Range   Glucose-Capillary 128 (H) 70 - 99 mg/dL    Comment: Glucose reference range applies only to samples taken after fasting for at  least 8 hours.   Comment 1 Notify RN    Comment 2 Document in Chart   Prepare RBC (crossmatch)     Status: None   Collection Time: 04/21/24  6:32 PM  Result Value Ref Range   Order Confirmation      ORDER PROCESSED BY BLOOD BANK Performed at [facility], [address] [place], [place] [address]   Glucose, capillary     Status: Abnormal   Collection Time: 04/21/24  7:39 PM  Result Value Ref Range   Glucose-Capillary 134 (H) 70 - 99 mg/dL    Comment: Glucose reference range applies only to samples taken after fasting for at least 8 hours.   Comment 1 Notify RN    Comment 2 Document in Chart   Glucose, capillary     Status: Abnormal   Collection Time: 04/21/24 11:46 PM  Result Value Ref Range   Glucose-Capillary 135 (H) 70 - 99 mg/dL    Comment: Glucose reference range applies only to samples taken after fasting for at least 8 hours.   Comment 1 Notify RN    Comment 2 Document in Chart   CBC     Status: Abnormal   Collection Time: 04/22/24  2:47 AM  Result Value Ref Range   WBC 11.1 (H) 4.0 - 10.5 K/uL   RBC 3.46 (L) 4.22 - 5.81 MIL/uL   Hemoglobin 8.5 (L) 13.0 - 17.0 g/dL   HCT 21.3 (L) 08.6 - 57.8 %   MCV 80.9 80.0 - 100.0 fL   MCH 24.6 (L) 26.0 - 34.0 pg   MCHC 30.4 30.0 - 36.0 g/dL   RDW 46.9 (H) 62.9 - 52.8 %   Platelets 152 150 - 400 K/uL   nRBC 1.6 (H) 0.0 - 0.2 %    Comment: Performed at [facility], [address], [place], [place] [address]  Comprehensive metabolic panel     Status: Abnormal   Collection Time: 04/22/24  2:47 AM  Result Value Ref Range   Sodium 140 135 - 145 mmol/L   Potassium 4.1 3.5 - 5.1 mmol/L   Chloride 110 98 - 111 mmol/L   CO2 21 (L) 22 - 32 mmol/L   Glucose, Bld 128 (H) 70 - 99 mg/dL    Comment: Glucose reference range applies only to samples taken after fasting for at  least 8 hours.   BUN 33 (H) 8 - 23 mg/dL   Creatinine, Ser 1.02 0.61 - 1.24 mg/dL   Calcium 7.8 (L) 8.9 - 10.3 mg/dL   Total  Protein 5.3 (L) 6.5 - 8.1 g/dL   Albumin  2.2 (L) 3.5 - 5.0 g/dL   AST 37 15 - 41 U/L   ALT 52 (H) 0 - 44 U/L   Alkaline Phosphatase 65 38 - 126 U/L   Total Bilirubin 2.8 (H) 0.0 - 1.2 mg/dL   GFR, Estimated >72 >53 mL/min    Comment: (NOTE) Calculated using the CKD-EPI Creatinine Equation (2021)    Anion gap 9 5 - 15    Comment: Performed at [facility], [address], [place], [place] [address]  Magnesium      Status: None   Collection Time: 04/22/24  2:47 AM  Result Value Ref Range   Magnesium  2.0 1.7 - 2.4 mg/dL    Comment: Performed at [facility], [address], [place], [place] [address]  Phosphorus     Status: None   Collection Time: 04/22/24  2:47 AM  Result Value Ref Range   Phosphorus 3.4 2.5 - 4.6 mg/dL    Comment: Performed at [facility], [address] [place], [place] [address]  Bilirubin, direct     Status: Abnormal   Collection Time: 04/22/24  2:47 AM  Result Value Ref Range   Bilirubin, Direct 1.4 (H) 0.0 - 0.2 mg/dL    Comment: Performed at [facility], [address] [place], [place] [address]  Glucose, capillary     Status: Abnormal   Collection Time: 04/22/24  5:01 AM  Result Value Ref Range   Glucose-Capillary 121 (H) 70 - 99 mg/dL    Comment: Glucose reference range applies only to samples taken after fasting for at least 8 hours.   Comment 1 Notify RN    Comment 2 Document in Chart   Glucose, capillary     Status: Abnormal   Collection Time: 04/22/24  8:01 AM  Result Value Ref Range   Glucose-Capillary 125 (H) 70 - 99 mg/dL    Comment: Glucose reference range applies only to samples taken after fasting for at least 8 hours.   Comment 1 Notify RN    Comment 2 Document in Chart   Glucose, capillary     Status: Abnormal   Collection Time: 04/22/24 12:09 PM  Result Value Ref Range   Glucose-Capillary 125 (H) 70 - 99 mg/dL    Comment: Glucose reference range applies only  to samples taken after fasting for at least 8 hours.   Comment 1 Notify RN    Comment 2 Document in Chart   Glucose, capillary     Status: Abnormal   Collection Time: 04/22/24  3:52 PM  Result Value Ref Range   Glucose-Capillary 124 (H) 70 - 99 mg/dL    Comment: Glucose reference range applies only to samples taken after fasting for at least 8 hours.   Comment 1 Notify RN    Comment 2 Document in Chart   Glucose, capillary     Status: Abnormal   Collection Time: 04/22/24  7:41 PM  Result Value Ref Range   Glucose-Capillary 133 (H) 70 - 99 mg/dL    Comment: Glucose reference range applies only to samples taken after fasting for at least 8 hours.   Comment 1 Notify RN    Comment 2 Document in Chart   Glucose, capillary  Status: Abnormal   Collection Time: 04/22/24 11:16 PM  Result Value Ref Range   Glucose-Capillary 139 (H) 70 - 99 mg/dL    Comment: Glucose reference range applies only to samples taken after fasting for at least 8 hours.   Comment 1 Notify RN    Comment 2 Document in Chart   Occult blood card to lab, stool     Status: Abnormal   Collection Time: 04/23/24  2:49 AM  Result Value Ref Range   Fecal Occult Bld POSITIVE (A) NEGATIVE    Comment: Performed at [facility], [address], [place], [place] [address]  Glucose, capillary     Status: Abnormal   Collection Time: 04/23/24  3:59 AM  Result Value Ref Range   Glucose-Capillary 142 (H) 70 - 99 mg/dL    Comment: Glucose reference range applies only to samples taken after fasting for at least 8 hours.   Comment 1 Notify RN    Comment 2 Document in Chart   CBC     Status: Abnormal   Collection Time: 04/23/24  5:24 AM  Result Value Ref Range   WBC 10.5 4.0 - 10.5 K/uL   RBC 3.13 (L) 4.22 - 5.81 MIL/uL   Hemoglobin 8.0 (L) 13.0 - 17.0 g/dL   HCT 60.4 (L) 54.0 - 98.1 %   MCV 83.1 80.0 - 100.0 fL   MCH 25.6 (L) 26.0 - 34.0 pg   MCHC 30.8 30.0 - 36.0 g/dL   RDW 19.1 (H) 47.8 - 29.5 %    Platelets 127 (L) 150 - 400 K/uL   nRBC 0.8 (H) 0.0 - 0.2 %    Comment: Performed at [facility], [address] [place], [place] [address]  Comprehensive metabolic panel     Status: Abnormal   Collection Time: 04/23/24  5:24 AM  Result Value Ref Range   Sodium 134 (L) 135 - 145 mmol/L   Potassium 4.3 3.5 - 5.1 mmol/L   Chloride 107 98 - 111 mmol/L   CO2 21 (L) 22 - 32 mmol/L   Glucose, Bld 168 (H) 70 - 99 mg/dL    Comment: Glucose reference range applies only to samples taken after fasting for at least 8 hours.   BUN 30 (H) 8 - 23 mg/dL   Creatinine, Ser 8.65 0.61 - 1.24 mg/dL   Calcium 7.7 (L) 8.9 - 10.3 mg/dL   Total Protein 5.4 (L) 6.5 - 8.1 g/dL   Albumin  2.2 (L) 3.5 - 5.0 g/dL   AST 32 15 - 41 U/L   ALT 46 (H) 0 - 44 U/L   Alkaline Phosphatase 67 38 - 126 U/L   Total Bilirubin 1.7 (H) 0.0 - 1.2 mg/dL   GFR, Estimated >78 >46 mL/min    Comment: (NOTE) Calculated using the CKD-EPI Creatinine Equation (2021)    Anion gap 6 5 - 15    Comment: Performed at [facility], [address], [place], [place] [address]    Imaging / Studies: No results found.   Medications / Allergies: per chart  Antibiotics: Anti-infectives (From admission, onward)    Start     Dose/Rate Route Frequency Ordered Stop   04/21/24 1200  piperacillin -tazobactam (ZOSYN ) IVPB 3.375 g        3.375 g 12.5 mL/hr over 240 Minutes Intravenous Every 8 hours 04/21/24 1038     04/14/24 1000  vancomycin  (VANCOREADY) IVPB 1500 mg/300 mL  Status:  Discontinued  1,500 mg 150 mL/hr over 120 Minutes Intravenous Every 24 hours 04/13/24 0731 04/15/24 1416   04/14/24 0700  vancomycin  (VANCOREADY) IVPB 1750 mg/350 mL  Status:  Discontinued        1,750 mg 175 mL/hr over 120 Minutes Intravenous Every 24 hours 04/13/24 0547 04/13/24 0731   04/13/24 0615  vancomycin  (VANCOREADY) IVPB 1750 mg/350 mL        1,750 mg 175 mL/hr over 120 Minutes Intravenous  Once 04/13/24 0525  04/13/24 0811   04/13/24 0400  piperacillin -tazobactam (ZOSYN ) IVPB 3.375 g  Status:  Discontinued        3.375 g 100 mL/hr over 30 Minutes Intravenous Every 6 hours 04/13/24 0240 04/13/24 0244   04/13/24 0400  piperacillin -tazobactam (ZOSYN ) IVPB 3.375 g  Status:  Discontinued        3.375 g 12.5 mL/hr over 240 Minutes Intravenous Every 8 hours 04/13/24 0245 04/18/24 0936   04/12/24 2245  piperacillin -tazobactam (ZOSYN ) IVPB 3.375 g        3.375 g 100 mL/hr over 30 Minutes Intravenous  Once 04/12/24 2237 04/12/24 2333         Note: Portions of this report may have been transcribed using voice recognition software. Every effort was made to ensure accuracy; however, inadvertent computerized transcription errors may be present.   Any transcriptional errors that result from this process are unintentional.    Eddye Goodie, MD, FACS, MASCRS Esophageal, Gastrointestinal & Colorectal Surgery Robotic and Minimally Invasive Surgery  [facility] [address], [address] [place], [place] [address] [phone] Fax [phone] Main  CONTACT INFORMATION: Weekday (9AM-5PM): Call [facility] main office at [phone] Weeknight (5PM-9AM) or Weekend/Holiday: Check EPIC "Web Links" tab & use "AMION" (password " [email or web address]") for General Surgery [facility] coverage  Please, DO NOT use SecureChat  (it is not reliable communication to reach operating surgeons & will lead to a delay in care).   Epic staff messaging available for outptient concerns needing 1-2 business day response.      04/23/2024  7:45 AM

## 2024-04-23 NOTE — Progress Notes (Signed)
 PROGRESS NOTE  Donald Morales  DOB: 1956-05-09  PCP: Rae Bugler, MD WJX:[id number]  DOA: 04/12/2024  LOS: 10 days  Hospital Day: 12  Brief narrative: Donald Morales is a 68 y.o. adult with PMH significant for HTN, HLD, atrial flutter not on anticoagulation, hypothyroidism, BPH. 5/11, patient presented to the ED with progressive abdominal pain for few weeks worse on last few days.  Last BM was 5 days prior to presentation.  CT abdomen showed bowel wall thickening and also raised concern for SMV thrombosis.  Labs showed WC count of 12.1 and lactic acid elevated 2.8 and trended up to 4.9  Patient was seen by general surgery and taken to the OR emergently. Underwent diagnostic laparoscopy, converted to open.  Underwent resection of 40 cm of necrotic small bowel.  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 Left in discontinuity to return to OR in 24-48hrs.  Patient was admitted to ICU postop.  Required 20 phenylephrine  drip. 5/12, taken to OR for further bowel resection and closure of abdomen.  Postop, patient had Afib with RVR required amiodarone .  Eventually weaned off pressors, started on TPN,anticoagulation with IV heparin  drip.  5/14, extubated  5/16, transferred out to [facility]  Subjective: Patient was seen and examined this morning.   Propped up in bed.  Not in distress.  Feels better.  Had 3 dark melanotic bowel movements since last night.  NG tube removed. Able to tolerate clear liquid diet.  Advance to full liquid by general surgery today.  Assessment and plan: Mesenteric Vein Thrombosis --> Small bowel ischemia --> Bacterial translocation -- > Septic shock S/p surgical resection of bowel on 5/11, and further resection and closure on 5/12 Primary management per general surgery Has wound VAC in place No fever.  WBC count downtrending.  Can DC Zosyn . Continue IV fentanyl  as needed for pain control Initially required vasopressors postop.  Weaned off. Hemodynamically improving  now Recent Labs  Lab 04/18/24 0529 04/19/24 0555 04/21/24 0242 04/21/24 0905 04/22/24 0247 04/23/24 0524  WBC 9.0 11.4* 18.4*  --  11.1* 10.5  LATICACIDVEN  --  1.0  --  1.0  --   --    Postop ileus -improving Had 3 bowel movements since last night.  NG tube removed. Able to tolerate clear liquid diet.  Advance to full liquid by general surgery today. Remains on TPN.  Acute blood loss anemia Coffee-ground emesis Patient had some surgical blood loss but hemoglobin was stable .  5/19, noted to have a significant drop in hemoglobin, also had coffee-ground NG content.  Suspected upper GI bleeding Heparin  drip was held.  1 PRBC transfusion given.  Started on Protonix  drip.  Had dark melanotic bowel movements since last night.  Probably clearing up upper GI bleeding from 2 days ago. Hemoglobin somewhat down today but less likely to be actively bleeding Heparin  drip was resumed today which I agree with. Recent Labs    04/19/24 0555 04/21/24 0242 04/21/24 1224 04/22/24 0247 04/23/24 0524  HGB 9.5* 7.0* 7.0* 8.5* 8.0*  MCV 75.5* 76.4*  --  80.9 83.1   Hypokalemia/hypophosphatemia Secondary to GI loss from NG tube drainage Levels improved with replacement. Recent Labs  Lab 04/18/24 0529 04/19/24 0555 04/20/24 0355 04/21/24 0242 04/22/24 0247 04/23/24 0524  K 3.7 3.9 4.4 4.2 4.1 4.3  MG 1.9 1.9 2.1 2.0 2.0  --   PHOS 2.9 2.9 3.3 3.3 3.4  --    Hyponatremia Likely from low intake.  Continue to monitor. Recent Labs  Lab 04/17/24 0510 04/18/24 0529 04/19/24 0555 04/20/24 0355 04/21/24 0242 04/22/24 0247 04/23/24 0524  NA 135 131* 133* 134* 136 140 134*   Acute worsening of chronic systolic CHF Moderate to severe MR  Hypertension EF in 2023 with 35 to 40%.  Echo 5/11 showed EF of 30 to 35% with regional WMA and moderately reduced RV function, moderate to severe MR, moderate TR. Cardiology following.  Patient needs GDMT initiated once oral intake improves.  A-fib  with RVR Has history of A-fib and was seen by EP in the past but did not follow-up.  Qualified for anticoagulation but did not comply with it. With RVR this hospitalization, patient was started on amiodarone  drip, digoxin  and heparin  drip. Currently continued on IV amiodarone .  Noted a plan to switch from IV amiodarone  to oral metoprolol  Heparin  drip as above.  Elevated LFTs  likely component of shock liver Gradually improving continue total bili.  Continue to monitor Recent Labs  Lab 04/17/24 0510 04/18/24 0529 04/19/24 0555 04/21/24 0242 04/22/24 0247 04/23/24 0524  AST 58*  --   --  29 37 32  ALT 183*  --   --  57* 52* 46*  ALKPHOS 62  --   --  63 65 67  BILITOT 1.6*  --   --  2.0* 2.8* 1.7*  BILIDIR  --   --   --   --  1.4*  --   PROT 6.0*  --   --  5.8* 5.3* 5.4*  ALBUMIN  2.6*  --   --  2.3* 2.2* 2.2*  PLT 165 169 178 190 152 127*    Hyperglycemia A1c 6.3.  No prior history of diabetes Currently on SSI/Accu-Cheks Recent Labs  Lab 04/22/24 1941 04/22/24 2316 04/23/24 0359 04/23/24 0802 04/23/24 1153  GLUCAP 133* 139* 142* 134* 120*   Hx of hypothyroidism tsh 4.6 Resume oral Synthroid  today.  Acute urinary retention  BPH Patient endorses lower urinary tract symptoms but states he was afraid to go to the urologist.   5/16, started on Flomax .  Able to void.  Continue to monitor.   Mobility: Ambulates in the hallway  Goals of care   Code Status: Full Code     DVT prophylaxis:  SCDs Start: 04/13/24 0500   Antimicrobials: IV Zosyn  can stop today Fluid: TPN Consultants: General Surgery, cardiology  Family Communication: None at bedside  Status: Inpatient Level of care:  ICU   Patient is from: Home Needs to continue in-hospital care: Postop ileus improving. Anticipated d/c to: Pending clinical course   Diet:  Diet Order             Diet full liquid Room service appropriate? Yes; Fluid consistency: Thin  Diet effective now                    Scheduled Meds:  Chlorhexidine  Gluconate Cloth  6 each Topical Q0600   feeding supplement  237 mL Oral BID BM   insulin  aspart  0-15 Units Subcutaneous Q4H   levothyroxine   50 mcg Oral QAC breakfast   magic mouthwash  15 mL Oral QID   metoprolol  tartrate  25 mg Oral BID   multivitamin with minerals  1 tablet Oral Daily   pantoprazole   40 mg Oral BID AC   pneumococcal 20-valent conjugate vaccine  0.5 mL Intramuscular Tomorrow-1000   scopolamine   1 patch Transdermal Q72H   simethicone  80 mg Oral QID   sodium chloride  flush  10-40 mL Intracatheter Q12H  tamsulosin   0.4 mg Oral Daily    PRN meds: acetaminophen , alum & mag hydroxide-simeth, fentaNYL  (SUBLIMAZE ) injection, hydrALAZINE , menthol-cetylpyridinium, ondansetron  (ZOFRAN ) IV, phenol, prochlorperazine , sodium chloride  flush   Infusions:   amiodarone  30 mg/hr (04/23/24 0900)   heparin  1,500 Units/hr (04/23/24 1050)   TPN ADULT (ION) 85 mL/hr at 04/23/24 0900   TPN ADULT (ION)      Antimicrobials: Anti-infectives (From admission, onward)    Start     Dose/Rate Route Frequency Ordered Stop   04/21/24 1200  piperacillin -tazobactam (ZOSYN ) IVPB 3.375 g  Status:  Discontinued        3.375 g 12.5 mL/hr over 240 Minutes Intravenous Every 8 hours 04/21/24 1038 04/23/24 1014   04/14/24 1000  vancomycin  (VANCOREADY) IVPB 1500 mg/300 mL  Status:  Discontinued        1,500 mg 150 mL/hr over 120 Minutes Intravenous Every 24 hours 04/13/24 0731 04/15/24 1416   04/14/24 0700  vancomycin  (VANCOREADY) IVPB 1750 mg/350 mL  Status:  Discontinued        1,750 mg 175 mL/hr over 120 Minutes Intravenous Every 24 hours 04/13/24 0547 04/13/24 0731   04/13/24 0615  vancomycin  (VANCOREADY) IVPB 1750 mg/350 mL        1,750 mg 175 mL/hr over 120 Minutes Intravenous  Once 04/13/24 0525 04/13/24 0811   04/13/24 0400  piperacillin -tazobactam (ZOSYN ) IVPB 3.375 g  Status:  Discontinued        3.375 g 100 mL/hr over 30 Minutes Intravenous Every 6  hours 04/13/24 0240 04/13/24 0244   04/13/24 0400  piperacillin -tazobactam (ZOSYN ) IVPB 3.375 g  Status:  Discontinued        3.375 g 12.5 mL/hr over 240 Minutes Intravenous Every 8 hours 04/13/24 0245 04/18/24 0936   04/12/24 2245  piperacillin -tazobactam (ZOSYN ) IVPB 3.375 g        3.375 g 100 mL/hr over 30 Minutes Intravenous  Once 04/12/24 2237 04/12/24 2333       Objective: Vitals:   04/23/24 1100 04/23/24 1200  BP:    Pulse:    Resp: (!) 32   Temp:  99.3 F (37.4 C)  SpO2:      Intake/Output Summary (Last 24 hours) at 04/23/2024 1254 Last data filed at 04/23/2024 0945 Gross per 24 hour  Intake 3065.47 ml  Output 1075 ml  Net 1990.47 ml   Filed Weights   04/21/24 0349 04/22/24 0500 04/23/24 0500  Weight: 84.9 kg 86.6 kg 87 kg   Weight change: 0.4 kg Body mass index is 28.32 kg/m.   Physical Exam: General exam: Pleasant, elderly Caucasian male.  NG tube out Skin: No rashes, lesions or ulcers. HEENT: Atraumatic, normocephalic, no obvious bleeding Lungs: Clear to auscultation bilaterally,  CVS: S1, S2, faint pansystolic murmur GI/Abd: Soft, appropriate postop tenderness, nondistended, bowel sound sluggish but present.  Wound VAC in place.,   CNS: Alert, awake, oriented x 3 Psychiatry: In good spirits today. Extremities: No pedal edema, no calf tenderness  Data Review: I have personally reviewed the laboratory data and studies available.  F/u labs  Unresulted Labs (From admission, onward)     Start     Ordered   04/24/24 0500  Basic metabolic panel  Tomorrow morning,   R       Question:  Specimen collection method  Answer:  Unit=Unit collect   04/23/24 0927   04/23/24 1600  Heparin  level (unfractionated)  Once-Timed,   TIMED       Question:  Specimen collection method  Answer:  Unit=Unit collect   04/23/24 0914   04/21/24 0500  Triglycerides  (TPN Lab Panel)  Every Monday (0500),   R     Question:  Specimen collection method  Answer:  Unit=Unit collect    04/15/24 1416   04/21/24 0500  CBC  Daily,   R     Question:  Specimen collection method  Answer:  Unit=Unit collect   04/20/24 0426   04/17/24 0500  Comprehensive metabolic panel  (TPN Lab Panel)  Every Mon,Thu (0500),   R     Question:  Specimen collection method  Answer:  Unit=Unit collect   04/15/24 1416   04/17/24 0500  Magnesium   (TPN Lab Panel)  Every Mon,Thu (0500),   R     Question:  Specimen collection method  Answer:  Unit=Unit collect   04/15/24 1416   04/17/24 0500  Phosphorus  (TPN Lab Panel)  Every Mon,Thu (0500),   R     Question:  Specimen collection method  Answer:  Unit=Unit collect   04/15/24 1416   04/14/24 0500  Creatinine, serum  Daily,   R      04/13/24 0545           Signed, Hoyt Macleod, MD [facility] 04/23/2024

## 2024-04-24 DIAGNOSIS — I4819 Other persistent atrial fibrillation: Secondary | ICD-10-CM | POA: Diagnosis not present

## 2024-04-24 DIAGNOSIS — K559 Vascular disorder of intestine, unspecified: Secondary | ICD-10-CM | POA: Diagnosis not present

## 2024-04-24 DIAGNOSIS — I5022 Chronic systolic (congestive) heart failure: Secondary | ICD-10-CM | POA: Diagnosis not present

## 2024-04-24 DIAGNOSIS — D62 Acute posthemorrhagic anemia: Secondary | ICD-10-CM | POA: Diagnosis not present

## 2024-04-24 LAB — GLUCOSE, CAPILLARY
Glucose-Capillary: 123 mg/dL — ABNORMAL HIGH (ref 70–99)
Glucose-Capillary: 124 mg/dL — ABNORMAL HIGH (ref 70–99)
Glucose-Capillary: 125 mg/dL — ABNORMAL HIGH (ref 70–99)
Glucose-Capillary: 141 mg/dL — ABNORMAL HIGH (ref 70–99)
Glucose-Capillary: 144 mg/dL — ABNORMAL HIGH (ref 70–99)

## 2024-04-24 LAB — COMPREHENSIVE METABOLIC PANEL WITH GFR
ALT: 39 U/L (ref 0–44)
AST: 31 U/L (ref 15–41)
Albumin: 2.1 g/dL — ABNORMAL LOW (ref 3.5–5.0)
Alkaline Phosphatase: 79 U/L (ref 38–126)
Anion gap: 7 (ref 5–15)
BUN: 27 mg/dL — ABNORMAL HIGH (ref 8–23)
CO2: 24 mmol/L (ref 22–32)
Calcium: 7.3 mg/dL — ABNORMAL LOW (ref 8.9–10.3)
Chloride: 100 mmol/L (ref 98–111)
Creatinine, Ser: 0.86 mg/dL (ref 0.61–1.24)
GFR, Estimated: >60 mL/min (ref >60)
Glucose, Bld: 121 mg/dL — ABNORMAL HIGH (ref 70–99)
Potassium: 3.8 mmol/L (ref 3.5–5.1)
Sodium: 131 mmol/L — ABNORMAL LOW (ref 135–145)
Total Bilirubin: 1.5 mg/dL — ABNORMAL HIGH (ref 0.0–1.2)
Total Protein: 5.5 g/dL — ABNORMAL LOW (ref 6.5–8.1)

## 2024-04-24 LAB — CBC
HCT: 25.9 % — ABNORMAL LOW (ref 39.0–52.0)
Hemoglobin: 7.7 g/dL — ABNORMAL LOW (ref 13.0–17.0)
MCH: 24.4 pg — ABNORMAL LOW (ref 26.0–34.0)
MCHC: 29.7 g/dL — ABNORMAL LOW (ref 30.0–36.0)
MCV: 82.2 fL (ref 80.0–100.0)
Platelets: 146 10*3/uL — ABNORMAL LOW (ref 150–400)
RBC: 3.15 MIL/uL — ABNORMAL LOW (ref 4.22–5.81)
RDW: 21.2 % — ABNORMAL HIGH (ref 11.5–15.5)
WBC: 12.5 10*3/uL — ABNORMAL HIGH (ref 4.0–10.5)
nRBC: 0.5 % — ABNORMAL HIGH (ref 0.0–0.2)

## 2024-04-24 LAB — MAGNESIUM: Magnesium: 1.9 mg/dL (ref 1.7–2.4)

## 2024-04-24 LAB — HEPARIN LEVEL (UNFRACTIONATED)
Heparin Unfractionated: 0.32 [IU]/mL (ref 0.30–0.70)
Heparin Unfractionated: 0.31 [IU]/mL (ref 0.30–0.70)

## 2024-04-24 LAB — PHOSPHORUS: Phosphorus: 3.5 mg/dL (ref 2.5–4.6)

## 2024-04-24 MED ORDER — MAGNESIUM SULFATE 2 GM/50ML IV SOLN
2.0000 g | Freq: Once | INTRAVENOUS | Status: AC
  Administered 2024-04-24: 2 g via INTRAVENOUS
  Filled 2024-04-24: qty 50

## 2024-04-24 MED ORDER — MAGNESIUM SULFATE 2 GM/50ML IV SOLN
2.0000 g | Freq: Once | INTRAVENOUS | Status: DC
  Filled 2024-04-24: qty 50

## 2024-04-24 MED ORDER — MAGNESIUM SULFATE 2 GM/50ML IV SOLN: 2.0000 g | Freq: Once | INTRAVENOUS | Status: DC

## 2024-04-24 MED ORDER — INSULIN ASPART 100 UNIT/ML IJ SOLN
0.0000 [IU] | Freq: Three times a day (TID) | INTRAMUSCULAR | Status: AC
  Administered 2024-04-24 (×2): 2 [IU] via SUBCUTANEOUS

## 2024-04-24 MED ORDER — TRAVASOL 10 % IV SOLN
INTRAVENOUS | Status: AC
  Filled 2024-04-24: qty 576

## 2024-04-24 MED ORDER — CALCIUM POLYCARBOPHIL 625 MG PO TABS
625.0000 mg | ORAL_TABLET | Freq: Two times a day (BID) | ORAL | Status: DC
  Administered 2024-04-24 – 2024-04-30 (×13): 625 mg via ORAL
  Filled 2024-04-24 (×13): qty 1

## 2024-04-24 MED ORDER — METOPROLOL TARTRATE 50 MG PO TABS
50.0000 mg | ORAL_TABLET | Freq: Two times a day (BID) | ORAL | Status: DC
  Administered 2024-04-24 – 2024-04-25 (×4): 50 mg via ORAL
  Filled 2024-04-24 (×3): qty 1
  Filled 2024-04-24: qty 2

## 2024-04-24 MED ORDER — BISACODYL 10 MG RE SUPP: 10.0000 mg | Freq: Two times a day (BID) | RECTAL | Status: DC | PRN

## 2024-04-24 MED ORDER — MORPHINE SULFATE (PF) 2 MG/ML IV SOLN: 1.0000 mg | INTRAVENOUS | Status: DC | PRN

## 2024-04-24 MED ORDER — OXYCODONE HCL 5 MG PO TABS: 5.0000 mg | ORAL_TABLET | ORAL | Status: DC | PRN

## 2024-04-24 NOTE — Plan of Care (Signed)
  Problem: Education: Goal: Knowledge of General Education information will improve Description: Including pain rating scale, medication(s)/side effects and non-pharmacologic comfort measures Outcome: Progressing   Problem: Clinical Measurements: Goal: Respiratory complications will improve Outcome: Progressing   Problem: Coping: Goal: Level of anxiety will decrease Outcome: Progressing   Problem: Elimination: Goal: Will not experience complications related to urinary retention Outcome: Progressing   Problem: Clinical Measurements: Goal: Cardiovascular complication will be avoided Outcome: Not Progressing   Problem: Nutrition: Goal: Adequate nutrition will be maintained Outcome: Not Progressing

## 2024-04-24 NOTE — Progress Notes (Signed)
 PROGRESS NOTE  Donald Morales  DOB: 02-24-1956  PCP: Rae Bugler, MD OVF:[id number]  DOA: 04/12/2024  LOS: 11 days  Hospital Day: 13  Brief narrative: Donald Morales is a 68 y.o. adult with PMH significant for HTN, HLD, atrial flutter not on anticoagulation, hypothyroidism, BPH. 5/11, patient presented to the ED with progressive abdominal pain for few weeks worse on last few days.  Last BM was 5 days prior to presentation.  CT abdomen showed bowel wall thickening and also raised concern for SMV thrombosis.  Labs showed WC count of 12.1 and lactic acid elevated 2.8 and trended up to 4.9  Patient was seen by general surgery and taken to the OR emergently. Underwent diagnostic laparoscopy, converted to open.  Underwent resection of 40 cm of necrotic small bowel.  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 Left in discontinuity to return to OR in 24-48hrs.  Patient was admitted to ICU postop.  Required 20 phenylephrine  drip. 5/12, taken to OR for further bowel resection and closure of abdomen.  Postop, patient had Afib with RVR required amiodarone .  Eventually weaned off pressors, started on TPN,anticoagulation with IV heparin  drip.  5/14, extubated  5/16, transferred out to [facility]  Subjective: Patient was seen and examined this morning.   Lying down on bed. In good spirits.  Feeling better.  Able to tolerate full liquid diet and was advanced to soft this morning. Bowel movement this morning with black stool Slight drop in hemoglobin noted  Assessment and plan: Mesenteric Vein Thrombosis --> Small bowel ischemia --> Bacterial translocation -- > Septic shock S/p surgical resection of bowel on 5/11, and further resection and closure on 5/12 Primary management per general surgery Postop ileus resolved.  Advance to soft diet today.  TPN half bag for today with plan to stop tomorrow. Has wound VAC in place No fever.  WBC count downtrending.  Completes a course of Zosyn  For pain control,  currently on IV fentanyl  as needed.  Not utilizing oral pain meds.  I would stop IV fentanyl , encourage oral oxycodone and start IV morphine as needed Recent Labs  Lab 04/19/24 0555 04/21/24 0242 04/21/24 0905 04/22/24 0247 04/23/24 0524 04/24/24 0207  WBC 11.4* 18.4*  --  11.1* 10.5 12.5*  LATICACIDVEN 1.0  --  1.0  --   --   --     Acute blood loss anemia Coffee-ground emesis Patient had some surgical blood loss but hemoglobin was stable .  5/19, noted to have a significant drop in hemoglobin, also had coffee-ground NG content.  Suspected upper GI bleeding Heparin  drip was held.  1 PRBC transfusion given.  Started on Protonix  drip.  Had dark melanotic on bowel movements.  Probably clearing up anastomotic bleeding from 2 days ago. Hemoglobin somewhat down today but less likely to be actively bleeding Heparin  drip to continue with the plan to switch to oral anticoagulant prior to discharge Recent Labs    04/21/24 0242 04/21/24 1224 04/22/24 0247 04/23/24 0524 04/24/24 0207  HGB 7.0* 7.0* 8.5* 8.0* 7.7*  MCV 76.4*  --  80.9 83.1 82.2   Hyponatremia Likely from low intake.  Continue to monitor. Recent Labs  Lab 04/18/24 0529 04/19/24 0555 04/20/24 0355 04/21/24 0242 04/22/24 0247 04/23/24 0524 04/24/24 0207  NA 131* 133* 134* 136 140 134* 131*   Acute worsening of chronic systolic CHF Moderate to severe MR  Hypertension EF in 2023 with 35 to 40%.  Echo 5/11 showed EF of 30 to 35% with regional WMA  and moderately reduced RV function, moderate to severe MR, moderate TR. Cardiology following.  Patient needs GDMT initiated once oral intake improves.  A-fib with RVR Has history of A-fib and was seen by EP in the past but did not follow-up.  Qualified for anticoagulation but did not comply with it. With RVR this hospitalization, patient was started on amiodarone  drip, digoxin  and heparin  drip. Subsequently switched to oral metoprolol .  Anticoagulation plan as  above.  Hyperglycemia A1c 6.3.  No prior history of diabetes Blood sugar was elevated because of TPN currently on SSI/Accu-Cheks Recent Labs  Lab 04/23/24 1626 04/23/24 2011 04/24/24 0037 04/24/24 0338 04/24/24 0747  GLUCAP 135* 146* 141* 124* 123*   Hx of hypothyroidism tsh 4.6 Resume oral Synthroid  today.  Acute urinary retention  BPH Patient endorses lower urinary tract symptoms but states he was afraid to go to the urologist.   5/16, started on Flomax .  Able to void.  Continue to monitor.   Mobility: Ambulates in the hallway  Goals of care   Code Status: Full Code     DVT prophylaxis:  SCDs Start: 04/13/24 0500   Antimicrobials: IV Zosyn  stopped Fluid: TPN Consultants: General Surgery, cardiology  Family Communication: None at bedside  Status: Inpatient Level of care: Can transfer out of ICU to Telemetry .    Patient is from: Home Needs to continue in-hospital care: Postop ileus improving. Anticipated d/c to: Pending clinical course   Diet:  Diet Order             DIET SOFT Room service appropriate? Yes; Fluid consistency: Thin  Diet effective now                   Scheduled Meds:  benzonatate  100 mg Oral TID   Chlorhexidine  Gluconate Cloth  6 each Topical Q0600   feeding supplement  237 mL Oral BID BM   furosemide   40 mg Intravenous Daily   insulin  aspart  0-15 Units Subcutaneous Q8H   levothyroxine   50 mcg Oral QAC breakfast   metoprolol  tartrate  50 mg Oral BID   multivitamin with minerals  1 tablet Oral Daily   pantoprazole   40 mg Oral BID AC   pneumococcal 20-valent conjugate vaccine  0.5 mL Intramuscular Tomorrow-1000   polycarbophil  625 mg Oral BID   scopolamine   1 patch Transdermal Q72H   simethicone  80 mg Oral QID   sodium chloride  flush  10-40 mL Intracatheter Q12H   tamsulosin   0.4 mg Oral Daily    PRN meds: acetaminophen , alum & mag hydroxide-simeth, bisacodyl, fentaNYL  (SUBLIMAZE ) injection, hydrALAZINE ,  menthol-cetylpyridinium, ondansetron  (ZOFRAN ) IV, oxyCODONE, phenol, prochlorperazine , sodium chloride  flush   Infusions:   heparin  1,600 Units/hr (04/24/24 1149)   TPN ADULT (ION) 85 mL/hr at 04/24/24 1149   TPN ADULT (ION)      Antimicrobials: Anti-infectives (From admission, onward)    Start     Dose/Rate Route Frequency Ordered Stop   04/21/24 1200  piperacillin -tazobactam (ZOSYN ) IVPB 3.375 g  Status:  Discontinued        3.375 g 12.5 mL/hr over 240 Minutes Intravenous Every 8 hours 04/21/24 1038 04/23/24 1014   04/14/24 1000  vancomycin  (VANCOREADY) IVPB 1500 mg/300 mL  Status:  Discontinued        1,500 mg 150 mL/hr over 120 Minutes Intravenous Every 24 hours 04/13/24 0731 04/15/24 1416   04/14/24 0700  vancomycin  (VANCOREADY) IVPB 1750 mg/350 mL  Status:  Discontinued  1,750 mg 175 mL/hr over 120 Minutes Intravenous Every 24 hours 04/13/24 0547 04/13/24 0731   04/13/24 0615  vancomycin  (VANCOREADY) IVPB 1750 mg/350 mL        1,750 mg 175 mL/hr over 120 Minutes Intravenous  Once 04/13/24 0525 04/13/24 0811   04/13/24 0400  piperacillin -tazobactam (ZOSYN ) IVPB 3.375 g  Status:  Discontinued        3.375 g 100 mL/hr over 30 Minutes Intravenous Every 6 hours 04/13/24 0240 04/13/24 0244   04/13/24 0400  piperacillin -tazobactam (ZOSYN ) IVPB 3.375 g  Status:  Discontinued        3.375 g 12.5 mL/hr over 240 Minutes Intravenous Every 8 hours 04/13/24 0245 04/18/24 0936   04/12/24 2245  piperacillin -tazobactam (ZOSYN ) IVPB 3.375 g        3.375 g 100 mL/hr over 30 Minutes Intravenous  Once 04/12/24 2237 04/12/24 2333       Objective: Vitals:   04/24/24 0754 04/24/24 1100  BP:    Pulse:    Resp:    Temp: 99.5 F (37.5 C) 99.1 F (37.3 C)  SpO2:      Intake/Output Summary (Last 24 hours) at 04/24/2024 1348 Last data filed at 04/24/2024 1209 Gross per 24 hour  Intake 2681.69 ml  Output 3405 ml  Net -723.31 ml   Filed Weights   04/22/24 0500 04/23/24 0500  04/24/24 0500  Weight: 86.6 kg 87 kg 87.4 kg   Weight change: 0.4 kg Body mass index is 28.45 kg/m.   Physical Exam: General exam: Pleasant, elderly Caucasian male.  Skin: No rashes, lesions or ulcers. HEENT: Atraumatic, normocephalic, no obvious bleeding Lungs: Clear to auscultation bilaterally,  CVS: S1, S2, faint pansystolic murmur GI/Abd: Soft, appropriate postop tenderness, nondistended, bowel sound present.  Wound VAC in place.,   CNS: Alert, awake, oriented x 3 Psychiatry: In good spirits today. Extremities: No pedal edema, no calf tenderness  Data Review: I have personally reviewed the laboratory data and studies available.  F/u labs  Unresulted Labs (From admission, onward)     Start     Ordered   04/25/24 0500  Heparin  level (unfractionated)  Daily,   R     Question:  Specimen collection method  Answer:  Unit=Unit collect   04/24/24 0920   04/21/24 0500  Triglycerides  (TPN Lab Panel)  Every Monday (0500),   R     Question:  Specimen collection method  Answer:  Unit=Unit collect   04/15/24 1416   04/21/24 0500  CBC  Daily,   R     Question:  Specimen collection method  Answer:  Unit=Unit collect   04/20/24 0426   04/17/24 0500  Comprehensive metabolic panel  (TPN Lab Panel)  Every Mon,Thu (0500),   R     Question:  Specimen collection method  Answer:  Unit=Unit collect   04/15/24 1416   04/17/24 0500  Magnesium   (TPN Lab Panel)  Every Mon,Thu (0500),   R     Question:  Specimen collection method  Answer:  Unit=Unit collect   04/15/24 1416   04/17/24 0500  Phosphorus  (TPN Lab Panel)  Every Mon,Thu (0500),   R     Question:  Specimen collection method  Answer:  Unit=Unit collect   04/15/24 1416   04/14/24 0500  Creatinine, serum  Daily,   R      04/13/24 0545           Signed, Hoyt Macleod, MD [facility] 04/24/2024

## 2024-04-24 NOTE — Progress Notes (Signed)
 eLink Physician-Brief Progress Note Patient Name: Donald Morales DOB: June 02, 1956 MRN: [id number]   Date of Service  04/24/2024  HPI/Events of Note  Patient refusing additional iv site. Mag, Hep, Amio, and TPN all incompatible with each other  Has a triple-lumen CVC plus peripheral IV x 1  eICU Interventions  Hold magnesium  for now, maintain amiodarone , heparin , and TPN  Consider transitioning amiodarone  to beta-blockade or digoxin   Consider transitioning heparin  to enoxaparin   TPN adjustment per nutrition/pharmacy for additional magnesium      Intervention Category Minor Interventions: Routine modifications to care plan (e.g. PRN medications for pain, fever)  Donald Morales 04/24/2024, 6:27 AM

## 2024-04-24 NOTE — Progress Notes (Signed)
 OT Cancellation Note  Patient Details Name: Donald Morales MRN: [id number] DOB: 02-04-1956    OT acknowledging new OT order for disposition clarification. PT and OT already following this patient closely. Will re-evaluate 04/25/24 as per order to determine safest dispo setting from OT perspective. Thank you for this re-eval order.   Kiran Lapine OT/L Acute Rehabilitation Department  [phone]   04/24/2024, 10:52 AM

## 2024-04-24 NOTE — Progress Notes (Addendum)
 PHARMACY - TOTAL PARENTERAL NUTRITION CONSULT NOTE   Indication: bowel obstruction  Patient Measurements: Height: 5\' 9"  (175.3 cm) Weight: 87.4 kg (192 lb 10.9 oz) IBW/kg (Calculated) : 70.7 TPN AdjBW (KG): 75.2 Body mass index is 28.45 kg/m. Usual Weight:   Assessment:  Donald Morales admitted 5/11 with abdominal pain and AFib with RVR. 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 Underwent small bowel resection x2 with anastomosis and abdominal wall closure on 5/12. Pharmacy is consulted to dose TPN.  Glucose / Insulin : No hx DM noted.  A1c 6.3 - CBGs well controlled (goal 100-150; range 120-146) on current TPN/insulin  regimen (10 units SSI required yesterday) Electrolytes: WNL except Na remains low despite increasing in TPN yesterday  Renal: SCr stable WNL; BUN slightly elevated but trending down; UOP significantly increased yesterday Hepatic: LFTs normalized; Tbili still slightly elevated but improving; Albumin  remains low but stable ~2.2 - TG 54 (5/19) I/O:  - Output: NG removed 5/21; no drain output charted since 5/21 - LBM 5/21 (x3 overnight) - mIVF: none GI Imaging: - CTa/p done 5/10 showing as "ended," but no results present GI Surgeries / Procedures:  - 5/11 OR:  lap > open for necrotic small bowel w/ 40cm small bowel resected and notable mesenteric vein thromboses; left in discontinuity - 5/12 OR: 2nd look, additional 40 cm small bowel resected, with creation of anastomosis & abdominal wall closure  Central access: CVC 5/11 TPN start date: 5/13  Nutritional Goals: Goal TPN rate is 85 mL/hr and provides 122 g of protein and 1871 kcals per day.  RD Assessment:  Estimated Needs Total Energy Estimated Needs: 2000-2250 kcals Total Protein Estimated Needs: 110-125 grams Total Fluid Estimated Needs: >/= 2L  Current Nutrition:  ADAT to full liquids, TPN  Plan: Per Surgery; advancing to soft diet today; wean TPN to 1/2 rate Agree with Mag 2g IV x 1 ordered  this AM by ICU RN per [facility] protocol   Decrease TPN to 40 mL/hr at 1800 Electrolytes in TPN: no changes from yesterday Na 140 mEq/L  K 60 mEq/L Ca 8 mEq/L Mg 16mEq/L  Phos 20 mmol/L Cl:Ac 1:1 Continue MVI/trace as oral Stop SSI after TPN transitioned to 1/2 rate No IVF per MD. Monitor TPN labs on Mon/Thurs and pas needed   Tera Fellows, PharmD, BCPS [phone] 04/24/2024, 7:25 AM

## 2024-04-24 NOTE — Progress Notes (Signed)
 PHARMACY - ANTICOAGULATION CONSULT NOTE  Pharmacy Consult for Heparin  Indication: atrial fibrillation, SMV thrombosis  Allergies  Allergen Reactions   Aspirin     81 mg - epistaxis/gums bleeding   Codeine     hallucinations    Patient Measurements: Height: 5\' 9"  (175.3 cm) Weight: 87 kg (191 lb 12.8 oz) IBW/kg (Calculated) : 70.7 HEPARIN  DW (KG): 88.5  Vital Signs: Temp: 98.1 F (36.7 C) (05/22 0000) Temp Source: Oral (05/22 0000) BP: 119/66 (05/21 2128) Pulse Rate: 92 (05/21 2128)  Labs: Recent Labs    04/22/24 0247 04/23/24 0524 04/23/24 1842 04/24/24 0207  HGB 8.5* 8.0*  --  7.7*  HCT 28.0* 26.0*  --  25.9*  PLT 152 127*  --  146*  HEPARINUNFRC  --   --  0.27* 0.32  CREATININE 0.70 0.80  --  0.86    Estimated Creatinine Clearance (by C-G formula based on SCr of 0.86 mg/dL) Male: 16.1 mL/min Male: 93.5 mL/min   Medical History: Past Medical History:  Diagnosis Date   BPH (benign prostatic hyperplasia)    History of epistaxis    Hyperlipidemia    Muscle ache    Persistent atrial fibrillation (HCC)    Premature ventricular contraction    RBBB    RBBB    Thyroid disease    Typical atrial flutter (HCC)     Medications:  Infusions:   amiodarone  30 mg/hr (04/24/24 0314)   heparin  1,600 Units/hr (04/24/24 0313)   TPN ADULT (ION) 85 mL/hr at 04/23/24 1759    Assessment: 67 yoM admitted on 5/11 with abdominal pain and AFib with RVR.  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  PMH significant for AFib, but has been off medications/anticoagulation for past 2 years.  Pharmacy is consulted to dose heparin  IV.  Significant events: - 5/12 Heparin  held at 4am for surgery, resumed on 5/12 at 19:00 - 5/19 Holding heparin  infusion for suspected upper GI bleed - 5/21 Heparin  to resume per Dr. Gwynneth Lessen    Today, 04/24/2024: Heparin  level = 0.32 (therapeutic) with heparin  gtt @ 1600 units/hr CBC:  Hgb decreased to 7.7 (5/19 Hgb 7, 5/20 Hgb up to 8.5 s/p  transfusions 5/19 and 5/20).  Plt 146 k Per RN, no complications overnight; no further BM's since the bloody one noted around 3pm 5/21  Goal of Therapy:  Heparin  level 0.3-0.5 units/ml Monitor platelets by anticoagulation protocol: Yes   Plan:  Continue IV heparin  gtt @ 1600 units/hr No bolus d/t recent bleeding  Recheck heparin  level in 6 hours to confirm therapeutic dose Daily heparin  level and CBC Monitor for signs and symptoms of bleeding and RN to notify pharmacy and/or Md if bleeding worsens   Agnes Hose, PharmD 04/24/2024 3:17 AM

## 2024-04-24 NOTE — Progress Notes (Signed)
 Progress Note  10 Days Post-Op  Subjective: Tolerated FLD and having bowel function, stool remain dark. Denies nausea or vomiting.   Objective: Vital signs in last 24 hours: Temp:  [98.1 F (36.7 C)-99.7 F (37.6 C)] 99.5 F (37.5 C) (05/22 0754) Pulse Rate:  [78-109] 80 (05/22 0700) Resp:  [14-32] 18 (05/22 0700) BP: (98-138)/(58-100) 107/61 (05/22 0700) SpO2:  [76 %-100 %] 96 % (05/22 0700) Weight:  [87.4 kg] 87.4 kg (05/22 0500) Last BM Date : 04/23/24  Intake/Output from previous day: 05/21 0701 - 05/22 0700 In: 2424.2 [I.V.:2389.8; IV Piggyback:34.4] Out: 2605 [Urine:2605] Intake/Output this shift: Total I/O In: 475.4 [I.V.:475.4] Out: -   PE: General: pleasant, WD, WN male who is laying in bed in NAD Heart: irregularly irregular Lungs: Respiratory effort nonlabored Abd: soft, appropriately ttp, midline wound with VAC present, Psych: A&Ox3 with an appropriate affect.    Lab Results:  Recent Labs    04/23/24 0524 04/24/24 0207  WBC 10.5 12.5*  HGB 8.0* 7.7*  HCT 26.0* 25.9*  PLT 127* 146*   BMET Recent Labs    04/23/24 0524 04/24/24 0207  NA 134* 131*  K 4.3 3.8  CL 107 100  CO2 21* 24  GLUCOSE 168* 121*  BUN 30* 27*  CREATININE 0.80 0.86  CALCIUM 7.7* 7.3*   PT/INR No results for input(s): "LABPROT", "INR" in the last 72 hours. CMP     Component Value Date/Time   NA 131 (L) 04/24/2024 0207   K 3.8 04/24/2024 0207   CL 100 04/24/2024 0207   CO2 24 04/24/2024 0207   GLUCOSE 121 (H) 04/24/2024 0207   BUN 27 (H) 04/24/2024 0207   CREATININE 0.86 04/24/2024 0207   CALCIUM 7.3 (L) 04/24/2024 0207   PROT 5.5 (L) 04/24/2024 0207   ALBUMIN  2.1 (L) 04/24/2024 0207   AST 31 04/24/2024 0207   ALT 39 04/24/2024 0207   ALKPHOS 79 04/24/2024 0207   BILITOT 1.5 (H) 04/24/2024 0207   GFRNONAA >60 04/24/2024 0207   Lipase     Component Value Date/Time   LIPASE 52 (H) 04/12/2024 2049       Studies/Results: No results  found.   Anti-infectives: Anti-infectives (From admission, onward)    Start     Dose/Rate Route Frequency Ordered Stop   04/21/24 1200  piperacillin -tazobactam (ZOSYN ) IVPB 3.375 g  Status:  Discontinued        3.375 g 12.5 mL/hr over 240 Minutes Intravenous Every 8 hours 04/21/24 1038 04/23/24 1014   04/14/24 1000  vancomycin  (VANCOREADY) IVPB 1500 mg/300 mL  Status:  Discontinued        1,500 mg 150 mL/hr over 120 Minutes Intravenous Every 24 hours 04/13/24 0731 04/15/24 1416   04/14/24 0700  vancomycin  (VANCOREADY) IVPB 1750 mg/350 mL  Status:  Discontinued        1,750 mg 175 mL/hr over 120 Minutes Intravenous Every 24 hours 04/13/24 0547 04/13/24 0731   04/13/24 0615  vancomycin  (VANCOREADY) IVPB 1750 mg/350 mL        1,750 mg 175 mL/hr over 120 Minutes Intravenous  Once 04/13/24 0525 04/13/24 0811   04/13/24 0400  piperacillin -tazobactam (ZOSYN ) IVPB 3.375 g  Status:  Discontinued        3.375 g 100 mL/hr over 30 Minutes Intravenous Every 6 hours 04/13/24 0240 04/13/24 0244   04/13/24 0400  piperacillin -tazobactam (ZOSYN ) IVPB 3.375 g  Status:  Discontinued        3.375 g 12.5 mL/hr over 240 Minutes Intravenous  Every 8 hours 04/13/24 0245 04/18/24 0936   04/12/24 2245  piperacillin -tazobactam (ZOSYN ) IVPB 3.375 g        3.375 g 100 mL/hr over 30 Minutes Intravenous  Once 04/12/24 2237 04/12/24 2333        Assessment/Plan SMV thrombosis and Small bowel ischemia  POD11 ex-lap, small bowel resection, open abdominal VAC Dr. Ramiro Burly POD10 ex-lap, resection 40 cm ileum with primary anastomosis and closure of abdomen Dr. Afton Horse - tolerating FLD - advance to soft diet - hgb is 7.7 from 8.0, suspect he may have had some anastomotic bleeding. Monitor stools, if significant drop in hgb may need to consider temporarily hold on hep gtt. Should be self-limited, already on BID PPI. Transfuse if hgb <7.0 - start to wean TPN - mobilize as tolerated  - VAC changes M-R  FEN: soft diet,  wean TPN VTE: hep gtt ID: [email or web address]  5/10>5/16  - per TRH -  HFrEF HTN Thyroid disease A. Fib   LOS: 11 days     [name], [facility] 04/24/2024, 8:01 AM Please see Amion for pager number during day hours 7:00am-4:30pm

## 2024-04-24 NOTE — Discharge Instructions (Addendum)
 [facility]      [facility], [place] [phone]  OPEN ABDOMINAL SURGERY: POST OP INSTRUCTIONS  Always review your discharge instruction sheet given to you by the facility where your surgery was performed.  IF YOU HAVE DISABILITY OR FAMILY LEAVE FORMS, YOU MUST BRING THEM TO THE OFFICE FOR PROCESSING.  PLEASE DO NOT GIVE THEM TO YOUR DOCTOR.  A prescription for pain medication may be given to you upon discharge.  Take your pain medication as prescribed, if needed.  If narcotic pain medicine is not needed, then you may take acetaminophen  (Tylenol ) or ibuprofen (Advil) as needed. Take your usually prescribed medications unless otherwise directed. If you need a refill on your pain medication, please contact your pharmacy. They will contact our office to request authorization.  Prescriptions will not be filled after 5pm or on week-ends. You should follow a light diet the first few days after arrival home, such as soup and crackers, pudding, etc.unless your doctor has advised otherwise. A high-fiber, low fat diet can be resumed as tolerated.   Be sure to include lots of fluids daily. Most patients will experience some swelling and bruising on the chest and neck area.  Ice packs will help.  Swelling and bruising can take several days to resolve Most patients will experience some swelling and bruising in the area of the incision. Ice pack will help. Swelling and bruising can take several days to resolve..  It is common to experience some constipation if taking pain medication after surgery.  Increasing fluid intake and taking a stool softener will usually help or prevent this problem from occurring.  A mild laxative (Milk of Magnesia or Miralax) should be taken according to package directions if there are no bowel movements after 48 hours.  You may have steri-strips (small skin tapes) in place directly over the incision.  These strips should be left on the skin for 7-10 days.  If your surgeon used skin  glue on the incision, you may shower in 24 hours.  The glue will flake off over the next 2-3 weeks.  Any sutures or staples will be removed at the office during your follow-up visit. You may find that a light gauze bandage over your incision may keep your staples from being rubbed or pulled. You may shower and replace the bandage daily. ACTIVITIES:  You may resume regular (light) daily activities beginning the next day--such as daily self-care, walking, climbing stairs--gradually increasing activities as tolerated.  You may have sexual intercourse when it is comfortable.  Refrain from any heavy lifting or straining until approved by your doctor. You may drive when you no longer are taking prescription pain medication, you can comfortably wear a seatbelt, and you can safely maneuver your car and apply brakes  You should see your doctor in the office for a follow-up appointment approximately two weeks after your surgery.  Make sure that you call for this appointment within a day or two after you arrive home to insure a convenient appointment time.  WHEN TO CALL YOUR DOCTOR: Fever over 101.0 Inability to urinate Nausea and/or vomiting Extreme swelling or bruising Continued bleeding from incision. Increased pain, redness, or drainage from the incision. Difficulty swallowing or breathing Muscle cramping or spasms. Numbness or tingling in hands or feet or around lips.  The clinic staff is available to answer your questions during regular business hours.  Please don't hesitate to call and ask to speak to one of the nurses if you have concerns.  For  further questions, please visit [email or web address]    Information on my medicine - ELIQUIS  (apixaban )   Why was Eliquis  prescribed for you? Eliquis  was prescribed for you to reduce the risk of a blood clot forming that can cause a stroke if you have a medical condition called atrial fibrillation (a type of irregular heartbeat).  What  do You need to know about Eliquis  ? Take your Eliquis  TWICE DAILY - one tablet in the morning and one tablet in the evening with or without food. If you have difficulty swallowing the tablet whole please discuss with your pharmacist how to take the medication safely.  Take Eliquis  exactly as prescribed by your doctor and DO NOT stop taking Eliquis  without talking to the doctor who prescribed the medication.  Stopping may increase your risk of developing a stroke.  Refill your prescription before you run out.  After discharge, you should have regular check-up appointments with your healthcare provider that is prescribing your Eliquis .  In the future your dose may need to be changed if your kidney function or weight changes by a significant amount or as you get older.  What do you do if you miss a dose? If you miss a dose, take it as soon as you remember on the same day and resume taking twice daily.  Do not take more than one dose of ELIQUIS  at the same time to make up a missed dose.  Important Safety Information A possible side effect of Eliquis  is bleeding. You should call your healthcare provider right away if you experience any of the following: Bleeding from an injury or your nose that does not stop. Unusual colored urine (red or dark brown) or unusual colored stools (red or black). Unusual bruising for unknown reasons. A serious fall or if you hit your head (even if there is no bleeding).  Some medicines may interact with Eliquis  and might increase your risk of bleeding or clotting while on Eliquis . To help avoid this, consult your healthcare provider or pharmacist prior to using any new prescription or non-prescription medications, including herbals, vitamins, non-steroidal anti-inflammatory drugs (NSAIDs) and supplements.  This website has more information on Eliquis  (apixaban ): [email or web address] .com/[email or web address] Donald Morales

## 2024-04-24 NOTE — Progress Notes (Signed)
 [facility] ADULT ICU REPLACEMENT PROTOCOL   The patient does apply for the [facility] Adult ICU Electrolyte Replacment Protocol based on the criteria listed below:   1.Exclusion criteria: TCTS, ECMO, Dialysis, and Myasthenia Gravis patients 2. Is GFR >/= 30 ml/min? Yes.    Patient's GFR today is >60 3. Is SCr </= 2? Yes.   Patient's SCr is 0.86 mg/dL 4. Did SCr increase >/= 0.5 in 24 hours? No. 5.Pt's weight >40kg  Yes.   6. Abnormal electrolyte(s): Mg = 1.9  7. Electrolytes replaced per protocol 8.  Call MD STAT for K+ </= 2.5, Phos </= 1, or Mag </= 1 Physician:  Rito Chess, eMD  Jeffry Minister 04/24/2024 4:51 AM

## 2024-04-24 NOTE — Progress Notes (Addendum)
 Patient Name: Donald Morales Date of Encounter: 04/24/2024 [facility] Cardiologist: None   Interval Summary  .    Overall no changes since yesterday, still feels good.  Hemoglobin continues to trend down very slowly.  7.7 today.  Rates are well-controlled 70s to 80s. Vital Signs .    Vitals:   04/24/24 0600 04/24/24 0700 04/24/24 0754 04/24/24 1100  BP: 109/74 107/61    Pulse: 78 80    Resp: (!) 22 18    Temp:   99.5 F (37.5 C) 99.1 F (37.3 C)  TempSrc:   Oral Oral  SpO2: 94% 96%    Weight:      Height:        Intake/Output Summary (Last 24 hours) at 04/24/2024 1300 Last data filed at 04/24/2024 1209 Gross per 24 hour  Intake 2793.29 ml  Output 3405 ml  Net -611.71 ml      04/24/2024    5:00 AM 04/23/2024    5:00 AM 04/22/2024    5:00 AM  Last 3 Weights  Weight (lbs) 192 lb 10.9 oz 191 lb 12.8 oz 190 lb 14.7 oz  Weight (kg) 87.4 kg 87 kg 86.6 kg      Telemetry/ECG    Atrial fibrillation heart rates have generally been in the 80s.- Personally Reviewed  CV Studies    Limited echocardiogram 04/13/2024  1. Left ventricular ejection fraction, by estimation, is 30 to 35%. The  left ventricle has moderately decreased function. The left ventricle  demonstrates regional wall motion abnormalities (see scoring  diagram/findings for description). The left  ventricular internal cavity size was mildly dilated.   2. Right ventricular systolic function is moderately reduced. The right  ventricular size is moderately enlarged.   3. Moderate to severe mitral valve regurgitation.   4. Tricuspid valve regurgitation is moderate.   5. The aortic valve is tricuspid. Aortic valve regurgitation is not  visualized. No aortic stenosis is present.   Physical Exam .   GEN: No acute distress.   Neck: No JVD Cardiac: Irregularly irregular, 2 out of 6 murmur Respiratory: Clear to auscultation bilaterally. GI: Soft, nontender, non-distended  MS: No edema  Patient Profile     Donald Morales is a 68 y.o. adult has hx of longstanding persistent atrial fibrillation/flutter, HFrEF, hypertension, hyperlipidemia, hypothyroidism.  Patient admitted for complaints of abdominal pain, diagnosed with SMV thrombosis with small bowel ischemia, bacterial translocation and septic shock.  He had surgical resection of the bowel 5/11 and 5/12.  Cardiology asked to see due to atrial fibrillation/CHF  Assessment & Plan .     Longstanding persistent atrial fibrillation Question whether this is related to ischemic MR.  Fortunately heart rates controlled generally in the 80s.  Asymptomatic.  Rates remain to be well-controlled on 80 mg.  We have stopped the IV amiodarone  and prioritizing rate control with beta-blocker. Since we stopped amiodarone , Lopressor  was increased from 25 to 50 mg twice daily.   Continue on IV heparin  until hemoglobin looks more stable.  Has been downtrending, 7.7 today.  No signs of bleeding though.  Discussed initiation of DOAC once more stable, patient is now amenable. TSH almost 12 and on synthroid .   Chronic HFrEF Limited echocardiogram this admission shows EF 30 to 35% with wall motion abnormalities in the inferior, anterior, lateral walls.  These wall motion abnormalities are not new though, also documented in March 2023 Donald.   RV is moderately reduced.  Overall looks euvolemic.   He  is getting IV lasix  40 mg daily Will titrate GDMT more when he is more stable.  Cardiomyopathy could be tachycardia induced but will need to rule out obstructive CAD once recovers from GI issues  Moderate to severe MR This is a new finding based off of echocardiogram this admission.  Likely functional in setting of systolic dysfunction.  SMV thrombosis Small bowel ischemia Anemia Management per surgery.  NG tube with very dark fluid.  Hemoglobin dropped from 9.5-7 on 5/19, received 1 unit PRBCs.  Surgery trying to progress his diet.  For questions or updates, please  contact [facility] Please consult [email or web address] for contact info under       Signed, Burnetta Cart, PA-C     Patient seen and examined.  Agree with above documentation.  On exam, patient is alert and oriented, irregular rhythm, normal rate, no murmurs, lungs CTAB, no LE edema.  Increase metoprolol  to 50 mg twice daily, will stop amiodarone  drip.  Continue IV heparin  for anticoagulation for now, will monitor hemoglobin.  Continue IV Lasix  40 mg daily.  Wendie Hamburg, MD

## 2024-04-24 NOTE — Progress Notes (Signed)
 PHARMACY - ANTICOAGULATION CONSULT NOTE  Pharmacy Consult for Heparin  Indication: atrial fibrillation, SMV thrombosis  Allergies  Allergen Reactions   Aspirin     81 mg - epistaxis/gums bleeding   Codeine     hallucinations    Patient Measurements: Height: 5\' 9"  (175.3 cm) Weight: 87.4 kg (192 lb 10.9 oz) IBW/kg (Calculated) : 70.7 HEPARIN  DW (KG): 88.5  Vital Signs: Temp: 99.5 F (37.5 C) (05/22 0754) Temp Source: Oral (05/22 0754) BP: 107/61 (05/22 0700) Pulse Rate: 80 (05/22 0700)  Labs: Recent Labs    04/22/24 0247 04/23/24 0524 04/23/24 1842 04/24/24 0207 04/24/24 0804  HGB 8.5* 8.0*  --  7.7*  --   HCT 28.0* 26.0*  --  25.9*  --   PLT 152 127*  --  146*  --   HEPARINUNFRC  --   --  0.27* 0.32 0.31  CREATININE 0.70 0.80  --  0.86  --     Estimated Creatinine Clearance (by C-G formula based on SCr of 0.86 mg/dL) Male: 16.1 mL/min Male: 93.8 mL/min  Medications:  Infusions:   heparin  1,600 Units/hr (04/24/24 0728)   magnesium  sulfate bolus IVPB     TPN ADULT (ION) 85 mL/hr at 04/24/24 0728   TPN ADULT (ION)      Assessment: 85 yoM admitted on 5/11 with abdominal pain and AFib with RVR.  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  PMH significant for AFib, but has been off medications/anticoagulation for past 2 years.  Pharmacy is consulted to dose heparin  IV.  Significant events: - 5/12 Heparin  held at 4am for surgery, resumed on 5/12 at 19:00 - 5/19 Holding heparin  infusion for suspected upper GI bleed - 5/21 Heparin  to resume per Dr. Gwynneth Lessen    Today, 04/24/2024: Heparin  level remains therapeutic and stable at lower goal on 1600 units/hr CBC:  Hgb decreased to 7.7 this AM (down slightly after transfusion 5/20) but no s/s bleeding per RN; Plt slightly low but stable/improved Per RN, no complications overnight; no further BM's since the bloody one noted around 3pm 5/21  Goal of Therapy:  Heparin  level 0.3-0.5 units/ml Monitor platelets by  anticoagulation protocol: Yes   Plan:  Continue IV heparin  gtt @ 1600 units/hr No bolus d/t recent bleeding  Daily heparin  level and CBC Monitor for signs and symptoms of bleeding and RN to notify pharmacy and/or MD if bleeding worsens   Tera Fellows, PharmD, BCPS [phone] 04/24/2024, 9:19 AM

## 2024-04-25 DIAGNOSIS — I5022 Chronic systolic (congestive) heart failure: Secondary | ICD-10-CM | POA: Diagnosis not present

## 2024-04-25 DIAGNOSIS — K559 Vascular disorder of intestine, unspecified: Secondary | ICD-10-CM | POA: Diagnosis not present

## 2024-04-25 DIAGNOSIS — I4819 Other persistent atrial fibrillation: Secondary | ICD-10-CM | POA: Diagnosis not present

## 2024-04-25 LAB — CBC
HCT: 24.6 % — ABNORMAL LOW (ref 39.0–52.0)
Hemoglobin: 7.5 g/dL — ABNORMAL LOW (ref 13.0–17.0)
MCH: 25.2 pg — ABNORMAL LOW (ref 26.0–34.0)
MCHC: 30.5 g/dL (ref 30.0–36.0)
MCV: 82.6 fL (ref 80.0–100.0)
Platelets: 182 10*3/uL (ref 150–400)
RBC: 2.98 MIL/uL — ABNORMAL LOW (ref 4.22–5.81)
RDW: 21.6 % — ABNORMAL HIGH (ref 11.5–15.5)
WBC: 8.6 10*3/uL (ref 4.0–10.5)
nRBC: 0 % (ref 0.0–0.2)

## 2024-04-25 LAB — BASIC METABOLIC PANEL WITH GFR
Anion gap: 6 (ref 5–15)
BUN: 24 mg/dL — ABNORMAL HIGH (ref 8–23)
CO2: 23 mmol/L (ref 22–32)
Calcium: 7.3 mg/dL — ABNORMAL LOW (ref 8.9–10.3)
Chloride: 103 mmol/L (ref 98–111)
Creatinine, Ser: 0.8 mg/dL (ref 0.61–1.24)
GFR, Estimated: >60 mL/min (ref >60)
Glucose, Bld: 124 mg/dL — ABNORMAL HIGH (ref 70–99)
Potassium: 3.8 mmol/L (ref 3.5–5.1)
Sodium: 132 mmol/L — ABNORMAL LOW (ref 135–145)

## 2024-04-25 LAB — HEPARIN LEVEL (UNFRACTIONATED)
Heparin Unfractionated: 0.19 [IU]/mL — ABNORMAL LOW (ref 0.30–0.70)
Heparin Unfractionated: 0.41 [IU]/mL (ref 0.30–0.70)
Heparin Unfractionated: 0.56 [IU]/mL (ref 0.30–0.70)

## 2024-04-25 LAB — MAGNESIUM: Magnesium: 2.2 mg/dL (ref 1.7–2.4)

## 2024-04-25 LAB — GLUCOSE, CAPILLARY
Glucose-Capillary: 118 mg/dL — ABNORMAL HIGH (ref 70–99)
Glucose-Capillary: 92 mg/dL (ref 70–99)

## 2024-04-25 MED ORDER — VITAMIN C 500 MG PO TABS
500.0000 mg | ORAL_TABLET | Freq: Two times a day (BID) | ORAL | Status: DC
  Administered 2024-04-25 – 2024-04-30 (×11): 500 mg via ORAL
  Filled 2024-04-25 (×11): qty 1

## 2024-04-25 MED ORDER — FERROUS SULFATE 325 (65 FE) MG PO TABS
325.0000 mg | ORAL_TABLET | Freq: Every day | ORAL | Status: DC
  Administered 2024-04-26 – 2024-04-30 (×5): 325 mg via ORAL
  Filled 2024-04-25 (×5): qty 1

## 2024-04-25 MED ORDER — HYDROCODONE BIT-HOMATROP MBR 5-1.5 MG/5ML PO SOLN
5.0000 mL | Freq: Four times a day (QID) | ORAL | Status: DC | PRN
Start: 2024-04-25 — End: 2024-04-27
  Administered 2024-04-25: 5 mL via ORAL
  Filled 2024-04-25: qty 5

## 2024-04-25 NOTE — TOC Progression Note (Addendum)
 Transition of Care [facility]) - Progression Note    Patient Details  Name: Donald Morales MRN: [id number] Date of Birth: 09-Dec-1955  [facility]) CM/SW Contact  Levie Ream, RN Phone Number: 04/25/2024, 4:51 PM  Clinical Narrative:    Notified by Lynann Sandman, RN, CM that Cindie Sillmon at [facility] has accepted referral for HHPT/OT; also confirmed w/ Cindie that agency can provide [facility]; she agency contact info placed in follow up provider section of d/c instructions.   Expected Discharge Plan: Home/Self Care Barriers to Discharge: Continued Medical Work up  Expected Discharge Plan and Services                                               Social Determinants of Health (SDOH) Interventions SDOH Screenings   Food Insecurity: Patient Unable To Answer (04/13/2024)  Housing: Patient Unable To Answer (04/13/2024)  Transportation Needs: Patient Unable To Answer (04/13/2024)  Utilities: Patient Unable To Answer (04/13/2024)  Social Connections: Patient Unable To Answer (04/13/2024)  Tobacco Use: Low Risk  (04/12/2024)    Readmission Risk Interventions    04/16/2024   11:56 AM  Readmission Risk Prevention Plan  Transportation Screening Complete  PCP or Specialist Appt within 5-7 Days Complete  Home Care Screening Complete  Medication Review (RN CM) Complete

## 2024-04-25 NOTE — Progress Notes (Signed)
 PHARMACY - ANTICOAGULATION CONSULT NOTE  Pharmacy Consult for Heparin  Indication: atrial fibrillation, SMV thrombosis  Allergies  Allergen Reactions   Aspirin     81 mg - epistaxis/gums bleeding   Codeine     hallucinations    Patient Measurements: Height: 5\' 9"  (175.3 cm) Weight: 87.4 kg (192 lb 10.9 oz) IBW/kg (Calculated) : 70.7 HEPARIN  DW (KG): 88.5  Vital Signs: Temp: 98.9 F (37.2 C) (05/23 1334) Temp Source: Oral (05/23 1334) BP: 102/69 (05/23 1334) Pulse Rate: 91 (05/23 1334)  Labs: Recent Labs    04/23/24 0524 04/23/24 1842 04/24/24 0207 04/24/24 0804 04/25/24 0500 04/25/24 1401  HGB 8.0*  --  7.7*  --  7.5*  --   HCT 26.0*  --  25.9*  --  24.6*  --   PLT 127*  --  146*  --  182  --   HEPARINUNFRC  --    < > 0.32 0.31 0.19* 0.41  CREATININE 0.80  --  0.86  --  0.80  --    < > = values in this interval not displayed.    Estimated Creatinine Clearance (by C-G formula based on SCr of 0.8 mg/dL) Male: 16.1 mL/min Male: 100.8 mL/min  Medications:  Infusions:   heparin  1,800 Units/hr (04/25/24 0556)   TPN ADULT (ION) 40 mL/hr at 04/24/24 1716    Assessment: 67 yoM admitted on 5/11 with abdominal pain and AFib with RVR.  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  PMH significant for AFib, but has been off medications/anticoagulation for past 2 years.  Pharmacy is consulted to dose heparin  IV.  Significant events: - 5/12 Heparin  held at 4am for surgery, resumed on 5/12 at 19:00 - 5/19 Holding heparin  infusion for suspected upper GI bleed - 5/21 Heparin  to resume per Dr. Gwynneth Lessen    Today, 04/25/2024: 05:00 Heparin  level 0.19 subtherapeutic on 1600 units/hr and rate increased 14:01 Heparin  level 0.41 therapeutic with IV heparin  infusing at 1800 units/hr Hgb low but stable, plts  WNL No bleeding or heparin  related issues per RN  Goal of Therapy:  Heparin  level 0.3-0.5 units/ml Monitor platelets by anticoagulation protocol: Yes   Plan:  Continue  IV heparin  continuous infusion at 1800 units/hr 6 hour confirmatory Heparin  level Daily heparin  level and CBC Monitor for signs and symptoms of bleeding and RN to notify pharmacy and/or MD for any bleeding   Thank you for allowing pharmacy to be a part of this patient's care.  Alfredo Inch, PharmD, BCPS Clinical Pharmacist [place] 04/25/2024 3:24 PM

## 2024-04-25 NOTE — Progress Notes (Signed)
 Patient Name: Donald Morales Date of Encounter: 04/25/2024 [facility] Cardiologist: None   Interval Summary  .    Hemoglobin continues to trickle down.  Today 7.5 down from 7.7 yesterday.  Still no overt signs of bleeding.  He started to feel more fatigued.  Attempted to walk today and was not able to make it very far.  Otherwise no other chest pain or shortness of breath.   Vital Signs .    Vitals:   04/25/24 0500 04/25/24 0610 04/25/24 0918 04/25/24 0944  BP:  100/64 120/69 120/69  Pulse:  77 85 85  Resp:  18 18   Temp:  98.8 F (37.1 C) 98.3 F (36.8 C)   TempSrc:  Oral Oral   SpO2:  98% 99%   Weight: 87.4 kg     Height:        Intake/Output Summary (Last 24 hours) at 04/25/2024 1037 Last data filed at 04/25/2024 1000 Gross per 24 hour  Intake 1848.96 ml  Output 2800 ml  Net -951.04 ml      04/25/2024    5:00 AM 04/24/2024    5:00 AM 04/23/2024    5:00 AM  Last 3 Weights  Weight (lbs) 192 lb 10.9 oz 192 lb 10.9 oz 191 lb 12.8 oz  Weight (kg) 87.4 kg 87.4 kg 87 kg      Telemetry/ECG    Atrial fibrillation heart rates have generally been in the 80s.- Personally Reviewed  CV Studies    Limited echocardiogram 04/13/2024  1. Left ventricular ejection fraction, by estimation, is 30 to 35%. The  left ventricle has moderately decreased function. The left ventricle  demonstrates regional wall motion abnormalities (see scoring  diagram/findings for description). The left  ventricular internal cavity size was mildly dilated.   2. Right ventricular systolic function is moderately reduced. The right  ventricular size is moderately enlarged.   3. Moderate to severe mitral valve regurgitation.   4. Tricuspid valve regurgitation is moderate.   5. The aortic valve is tricuspid. Aortic valve regurgitation is not  visualized. No aortic stenosis is present.   Physical Exam .   GEN: No acute distress.   Neck: No JVD Cardiac: Irregularly irregular, 2 out of 6  murmur Respiratory: Clear to auscultation bilaterally. GI: Soft, nontender, non-distended  MS: No edema  Patient Profile    Donald Morales is a 68 y.o. adult has hx of longstanding persistent atrial fibrillation/flutter, HFrEF, hypertension, hyperlipidemia, hypothyroidism.  Patient admitted for complaints of abdominal pain, diagnosed with SMV thrombosis with small bowel ischemia, bacterial translocation and septic shock.  He had surgical resection of the bowel 5/11 and 5/12.  Cardiology asked to see due to atrial fibrillation/CHF  Assessment & Plan .     Longstanding persistent atrial fibrillation Question whether this is related to ischemic MR.  Fortunately heart rates controlled generally in the 80s.  Asymptomatic.  Rates overall appear well-controlled, infrequently has elevated heart rates.  But overall in the 80s.  Doing well off of IV amiodarone . Continue with Lopressor  50 mg twice daily  Continue on IV heparin  until hemoglobin looks more stable.  Has been downtrending, 7.5 today.  No signs of bleeding though.  Discussed initiation of DOAC once more stable, patient is now amenable. TSH almost 12 and on synthroid .   Chronic HFrEF Limited echocardiogram this admission shows EF 30 to 35% with wall motion abnormalities in the inferior, anterior, lateral walls.  These wall motion abnormalities are not new though,  also documented in March 2023 echo.   RV is moderately reduced.  Overall looks euvolemic.   He is getting IV lasix  40 mg daily Will titrate GDMT more when he is more stable.  Cardiomyopathy could be tachycardia induced but will need to rule out obstructive CAD once recovers from GI issues  Moderate to severe MR This is a new finding based off of echocardiogram this admission.  Likely functional in setting of systolic dysfunction.  SMV thrombosis Small bowel ischemia Anemia Management per surgery.  NG tube with very dark fluid.  Hemoglobin dropped from 9.5-7 on 5/19, received  1 unit PRBCs.  Felt to have some anastomotic bleeding.  We will watch this carefully but may need to hold heparin  or transfuse if trend continues. For questions or updates, please contact [facility] Please consult [email or web address] for contact info under       Signed, Burnetta Cart, PA-C

## 2024-04-25 NOTE — Progress Notes (Signed)
 Patient Name: Donald Morales Date of Encounter: 04/25/2024 [facility] Cardiologist: None   Interval Summary  .    Hemoglobin continues to trickle down.  Today 7.5 down from 7.7 yesterday.  Still no overt signs of bleeding.  He started to feel more fatigued.  Attempted to walk today and was not able to make it very far.  Otherwise no other chest pain or shortness of breath.   Vital Signs .    Vitals:   04/25/24 0500 04/25/24 0610 04/25/24 0918 04/25/24 0944  BP:  100/64 120/69 120/69  Pulse:  77 85 85  Resp:  18 18   Temp:  98.8 F (37.1 C) 98.3 F (36.8 C)   TempSrc:  Oral Oral   SpO2:  98% 99%   Weight: 87.4 kg     Height:        Intake/Output Summary (Last 24 hours) at 04/25/2024 1147 Last data filed at 04/25/2024 1000 Gross per 24 hour  Intake 1848.96 ml  Output 2500 ml  Net -651.04 ml      04/25/2024    5:00 AM 04/24/2024    5:00 AM 04/23/2024    5:00 AM  Last 3 Weights  Weight (lbs) 192 lb 10.9 oz 192 lb 10.9 oz 191 lb 12.8 oz  Weight (kg) 87.4 kg 87.4 kg 87 kg      Telemetry/ECG    Atrial fibrillation heart rates have generally been in the 80s.- Personally Reviewed  CV Studies    Limited echocardiogram 04/13/2024  1. Left ventricular ejection fraction, by estimation, is 30 to 35%. The  left ventricle has moderately decreased function. The left ventricle  demonstrates regional wall motion abnormalities (see scoring  diagram/findings for description). The left  ventricular internal cavity size was mildly dilated.   2. Right ventricular systolic function is moderately reduced. The right  ventricular size is moderately enlarged.   3. Moderate to severe mitral valve regurgitation.   4. Tricuspid valve regurgitation is moderate.   5. The aortic valve is tricuspid. Aortic valve regurgitation is not  visualized. No aortic stenosis is present.   Physical Exam .   GEN: No acute distress.   Neck: No JVD Cardiac: Irregularly irregular, 2 out of 6  murmur Respiratory: Clear to auscultation bilaterally. GI: Soft, nontender, non-distended  MS: No edema  Patient Profile    Donald Morales is a 68 y.o. adult has hx of longstanding persistent atrial fibrillation/flutter, HFrEF, hypertension, hyperlipidemia, hypothyroidism.  Patient admitted for complaints of abdominal pain, diagnosed with SMV thrombosis with small bowel ischemia, bacterial translocation and septic shock.  He had surgical resection of the bowel 5/11 and 5/12.  Cardiology asked to see due to atrial fibrillation/CHF  Assessment & Plan .     Longstanding persistent atrial fibrillation Question whether this is related to ischemic MR.  Fortunately heart rates controlled generally in the 80s.  Asymptomatic.  Rates overall appear well-controlled, infrequently has elevated heart rates.  But overall in the 80s.  Doing well off of IV amiodarone . Continue with Lopressor  50 mg twice daily  Continue on IV heparin  until hemoglobin looks more stable.  Has been downtrending, 7.5 today.  No signs of bleeding though.  Discussed initiation of DOAC once more stable, patient is now amenable. TSH almost 12 and on synthroid .   Chronic HFrEF Limited echocardiogram this admission shows EF 30 to 35% with wall motion abnormalities in the inferior, anterior, lateral walls.  These wall motion abnormalities are not new though,  also documented in March 2023 echo.   RV is moderately reduced.  Overall looks euvolemic.   He is getting IV lasix  40 mg daily Will titrate GDMT more when he is more stable.  Cardiomyopathy could be tachycardia induced but will need to rule out obstructive CAD once recovers from GI issues  Moderate to severe MR This is a new finding based off of echocardiogram this admission.  Likely functional in setting of systolic dysfunction.  SMV thrombosis Small bowel ischemia Anemia Management per surgery.  NG tube with very dark fluid.  Hemoglobin dropped from 9.5-7 on 5/19, received  1 unit PRBCs.  Felt to have some anastomotic bleeding.  We will watch this carefully but may need to hold heparin  or transfuse if trend continues. For questions or updates, please contact [facility] Please consult [email or web address] for contact info under       Signed, Wendie Hamburg, MD     Patient seen and examined.  Agree with above documentation.  On exam, patient is alert and oriented, irregular rhythm, normal rate, no murmurs, lungs CTAB, no LE edema .  BP 120/69 today.  Creatinine stable at 0.8.  Hemoglobin trending down, 7.5 today.  A-fib appears rate controlled off IV amiodarone , continue Lopressor  50 mg twice daily.  Will remain on IV heparin  drip for now, can transition to DOAC once stable hemoglobin.  Continue IV Lasix  40 mg daily for diuresis.  Wendie Hamburg, MD

## 2024-04-25 NOTE — Progress Notes (Signed)
 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  On 5/11 he he required resection of necrotic bowel and was left in discontinuity.  On 5/12 he returned to the OR with a small residual area of necrotic tissue removed and was reconnected.  Wound VAC in place.  Transferred back to the ICU for mechanical ventilation. Now NPO with ng tube.  Pt with a history of A-fib not on anticoagulation    PT Comments  The patient reports feeling well. Patient ambulated with supervision using the RW  to the BR, did his own partial sponge bath /brushed teeth standing. Patient requested a  short seated rest  break prior to ambulating. Patient was not noted to be SOB. Will ambulate after a rest break.   If plan is discharge home, recommend the following: A little help with walking and/or transfers;A little help with bathing/dressing/bathroom;Assistance with cooking/housework;Assist for transportation;Help with stairs or ramp for entrance   Can travel by private vehicle     Yes  Equipment Recommendations  Rolling walker (2 wheels)    Recommendations for Other Services       Precautions / Restrictions Precautions Precaution/Restrictions Comments: monitor VS, abdominal incision with VAC; reviewed log roll Restrictions Weight Bearing Restrictions Per Provider Order: No     Mobility  Bed Mobility Overal bed mobility: Modified Independent   Rolling: Used rails              Transfers Overall transfer level: Needs assistance Equipment used: Rolling walker (2 wheels) Transfers: Sit to/from Stand Sit to Stand: Supervision                Ambulation/Gait Ambulation/Gait assistance: Supervision Gait Distance (Feet): 20 Feet (x 2) Assistive device: Rolling walker (2 wheels) Gait Pattern/deviations: Step-through  pattern, Decreased stride length       General Gait Details: amb  to BR,  stood from toilet independently   Stairs             Wheelchair Mobility     Tilt Bed    Modified Rankin (Stroke Patients Only)       Balance Overall balance assessment: Needs assistance   Sitting balance-Leahy Scale: Normal     Standing balance support: No upper extremity supported Standing balance-Leahy Scale: Good Standing balance comment: stood at sink for self spnge bath and brushed teeth. no LOB                            Communication Communication Communication: No apparent difficulties  Cognition Arousal: Alert Behavior During Therapy: WFL for tasks assessed/performed   PT - Cognitive impairments: No apparent impairments                         Following commands: Intact Following commands impaired: Follows multi-step commands with increased time    Cueing    Exercises      General Comments        Pertinent Vitals/Pain Pain Assessment Pain Assessment: No/denies pain    Home Living                          Prior Function            PT Goals (current goals can now be found  in the care plan section) Progress towards PT goals: Progressing toward goals    Frequency    Min 3X/week      PT Plan      Co-evaluation              AM-PAC PT "6 Clicks" Mobility   Outcome Measure  Help needed turning from your back to your side while in a flat bed without using bedrails?: None Help needed moving from lying on your back to sitting on the side of a flat bed without using bedrails?: None Help needed moving to and from a bed to a chair (including a wheelchair)?: None Help needed standing up from a chair using your arms (e.g., wheelchair or bedside chair)?: A Little Help needed to walk in hospital room?: A Little Help needed climbing 3-5 steps with a railing? : A Lot 6 Click Score: 20    End of Session   Activity Tolerance:  Patient tolerated treatment well Patient left: in chair;with call bell/phone within reach;with nursing/sitter in room Nurse Communication: Mobility status PT Visit Diagnosis: Difficulty in walking, not elsewhere classified (R26.2);Pain;Other abnormalities of gait and mobility (R26.89)     Time: 1610-9604 PT Time Calculation (min) (ACUTE ONLY): 19 min  Charges:    $Gait Training: 8-22 mins PT General Charges $$ ACUTE PT VISIT: 1 Visit                   Abelina Hoes PT Acute Rehabilitation Services Office [phone]   Dareen Ebbing 04/25/2024, 10:42 AM

## 2024-04-25 NOTE — Progress Notes (Signed)
 Progress Note  11 Days Post-Op  Subjective: Tolerating soft diet, having BMs that are non-bloody per his report. Moving much more independently. Hoping to be able to go home soon. Denies significant pain.   Objective: Vital signs in last 24 hours: Temp:  [98.7 F (37.1 C)-99.4 F (37.4 C)] 98.8 F (37.1 C) (05/23 0610) Pulse Rate:  [67-85] 85 (05/23 0944) Resp:  [18-19] 18 (05/23 0918) BP: (100-120)/(64-70) 120/69 (05/23 0944) SpO2:  [97 %-100 %] 99 % (05/23 0918) Weight:  [87.4 kg] 87.4 kg (05/23 0500) Last BM Date : 04/23/24  Intake/Output from previous day: 05/22 0701 - 05/23 0700 In: 2224.3 [I.V.:2174.3; IV Piggyback:50] Out: 2700 [Urine:2600; Drains:100] Intake/Output this shift: No intake/output data recorded.  PE: General: pleasant, WD, WN male who is laying in bed in NAD Heart: irregularly irregular Lungs: Respiratory effort nonlabored Abd: soft, appropriately ttp, midline wound with VAC present, Psych: A&Ox3 with an appropriate affect.    Lab Results:  Recent Labs    04/24/24 0207 04/25/24 0500  WBC 12.5* 8.6  HGB 7.7* 7.5*  HCT 25.9* 24.6*  PLT 146* 182   BMET Recent Labs    04/24/24 0207 04/25/24 0500  NA 131* 132*  K 3.8 3.8  CL 100 103  CO2 24 23  GLUCOSE 121* 124*  BUN 27* 24*  CREATININE 0.86 0.80  CALCIUM 7.3* 7.3*   PT/INR No results for input(s): "LABPROT", "INR" in the last 72 hours. CMP     Component Value Date/Time   NA 132 (L) 04/25/2024 0500   K 3.8 04/25/2024 0500   CL 103 04/25/2024 0500   CO2 23 04/25/2024 0500   GLUCOSE 124 (H) 04/25/2024 0500   BUN 24 (H) 04/25/2024 0500   CREATININE 0.80 04/25/2024 0500   CALCIUM 7.3 (L) 04/25/2024 0500   PROT 5.5 (L) 04/24/2024 0207   ALBUMIN  2.1 (L) 04/24/2024 0207   AST 31 04/24/2024 0207   ALT 39 04/24/2024 0207   ALKPHOS 79 04/24/2024 0207   BILITOT 1.5 (H) 04/24/2024 0207   GFRNONAA >60 04/25/2024 0500   Lipase     Component Value Date/Time   LIPASE 52 (H)  04/12/2024 2049       Studies/Results: No results found.   Anti-infectives: Anti-infectives (From admission, onward)    Start     Dose/Rate Route Frequency Ordered Stop   04/21/24 1200  piperacillin -tazobactam (ZOSYN ) IVPB 3.375 g  Status:  Discontinued        3.375 g 12.5 mL/hr over 240 Minutes Intravenous Every 8 hours 04/21/24 1038 04/23/24 1014   04/14/24 1000  vancomycin  (VANCOREADY) IVPB 1500 mg/300 mL  Status:  Discontinued        1,500 mg 150 mL/hr over 120 Minutes Intravenous Every 24 hours 04/13/24 0731 04/15/24 1416   04/14/24 0700  vancomycin  (VANCOREADY) IVPB 1750 mg/350 mL  Status:  Discontinued        1,750 mg 175 mL/hr over 120 Minutes Intravenous Every 24 hours 04/13/24 0547 04/13/24 0731   04/13/24 0615  vancomycin  (VANCOREADY) IVPB 1750 mg/350 mL        1,750 mg 175 mL/hr over 120 Minutes Intravenous  Once 04/13/24 0525 04/13/24 0811   04/13/24 0400  piperacillin -tazobactam (ZOSYN ) IVPB 3.375 g  Status:  Discontinued        3.375 g 100 mL/hr over 30 Minutes Intravenous Every 6 hours 04/13/24 0240 04/13/24 0244   04/13/24 0400  piperacillin -tazobactam (ZOSYN ) IVPB 3.375 g  Status:  Discontinued  3.375 g 12.5 mL/hr over 240 Minutes Intravenous Every 8 hours 04/13/24 0245 04/18/24 0936   04/12/24 2245  piperacillin -tazobactam (ZOSYN ) IVPB 3.375 g        3.375 g 100 mL/hr over 30 Minutes Intravenous  Once 04/12/24 2237 04/12/24 2333        Assessment/Plan SMV thrombosis and Small bowel ischemia  POD12 ex-lap, small bowel resection, open abdominal VAC Dr. Ramiro Burly POD11 ex-lap, resection 40 cm ileum with primary anastomosis and closure of abdomen Dr. Afton Horse - tolerating soft - advance to [facility] diet - hgb is 7.5 from 7.7, suspect he may have had some anastomotic bleeding. Monitor stools, if significant drop in hgb may need to consider temporarily hold on hep gtt. Should be self-limited, already on BID PPI. Transfuse if hgb <7.0. Start iron and vit c today  to help rebuild stores - DC TPN - mobilize as tolerated  - VAC changes M-R, wrote for home [facility] and HH RN. Per report measurements yesterday ~ 12 cm x 2 cm x 2.5 cm  FEN: HH diet, DC TPN VTE: hep gtt - ok to switch to DOAC from surgical standpoint  ID: [email or web address]  5/10>5/16  - per TRH -  HFrEF HTN Thyroid disease A. Fib   LOS: 12 days     [name], [facility] Surgery 04/25/2024, 9:52 AM Please see Amion for pager number during day hours 7:00am-4:30pm

## 2024-04-25 NOTE — Progress Notes (Addendum)
 PHARMACY - ANTICOAGULATION CONSULT NOTE  Pharmacy Consult for Heparin  Indication: atrial fibrillation, SMV thrombosis  Allergies  Allergen Reactions   Aspirin     81 mg - epistaxis/gums bleeding   Codeine     hallucinations    Patient Measurements: Height: 5\' 9"  (175.3 cm) Weight: 87.4 kg (192 lb 10.9 oz) IBW/kg (Calculated) : 70.7 HEPARIN  DW (KG): 88.5  Vital Signs: Temp: 98.7 F (37.1 C) (05/23 0207) Temp Source: Oral (05/23 0207) BP: 100/70 (05/23 0207) Pulse Rate: 75 (05/23 0207)  Labs: Recent Labs    04/23/24 0524 04/23/24 1842 04/24/24 0207 04/24/24 0804 04/25/24 0500  HGB 8.0*  --  7.7*  --  7.5*  HCT 26.0*  --  25.9*  --  24.6*  PLT 127*  --  146*  --  182  HEPARINUNFRC  --    < > 0.32 0.31 0.19*  CREATININE 0.80  --  0.86  --  0.80   < > = values in this interval not displayed.    Estimated Creatinine Clearance (by C-G formula based on SCr of 0.8 mg/dL) Male: 14.7 mL/min Male: 100.8 mL/min  Medications:  Infusions:   heparin  1,600 Units/hr (04/24/24 2044)   TPN ADULT (ION) 40 mL/hr at 04/24/24 1716    Assessment: 67 yoM admitted on 5/11 with abdominal pain and AFib with RVR.  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  PMH significant for AFib, but has been off medications/anticoagulation for past 2 years.  Pharmacy is consulted to dose heparin  IV.  Significant events: - 5/12 Heparin  held at 4am for surgery, resumed on 5/12 at 19:00 - 5/19 Holding heparin  infusion for suspected upper GI bleed - 5/21 Heparin  to resume per Dr. Gwynneth Lessen    Today, 04/25/2024: Heparin  level 0.19 subtherapeutic on 1600 units/hr Hgb low but stable, plts  WNL No bleeding per RN  Goal of Therapy:  Heparin  level 0.3-0.5 units/ml Monitor platelets by anticoagulation protocol: Yes   Plan:  Increase heparin  drip to 1800 units/hr No bolus d/t recent bleeding  Heparin  level in 6 hours Daily heparin  level and CBC Monitor for signs and symptoms of bleeding and RN to  notify pharmacy and/or MD if bleeding worsens   Beau Bound RPh 04/25/2024, 5:45 AM

## 2024-04-25 NOTE — Progress Notes (Signed)
 Physical Therapy Treatment Patient Details Name: Donald Morales MRN: [id number] DOB: [birth year] Today's Date: 04/25/2024   History of Present Illness 68 y/o gentleman  who presented with necrotic bowel due to venous obstruction.  On 5/11 he he required resection of necrotic bowel and was left in discontinuity.  On 5/12 he returned to the OR with a small residual area of necrotic tissue removed and was reconnected.  Wound VAC in place.  Transferred back to the ICU for mechanical ventilation. Now NPO with ng tube.  Pt with a history of A-fib not on anticoagulation    PT Comments  Patient had ~ 15 minute seated break after being in the BR and washing up.  Patient ambulated  x 200' using the RW, had a sudden onset of SOB, cough, incr RR, face flushed, stating he could not catch his breath. RN alerted and came to assess patient. Patient assisted to sit in a chair ASAP.  VS taken- BP 120/85, SPO2 99, HR 70.  Patient stated that he felt better and wanted to ambulate back to the room. Patient  ambulated x 83' with another episode of SOB, incr. RR, face flushed. Patient rolled back to his room. Patient able to stand and pivot back to bed. Patient noted to have more frequent coughing than noted prior to the initiation of PT ` 1 hour prior. RN messaging the MD about the  SOB.  Patient has been ambulating 400'+ when in ICU with no  noted SOB as noted this visit.  Patient resides alone, has been making  progress to return home with  HHPT and support from friends, but will be mostly home alone.        If plan is discharge home, recommend the following: A little help with walking and/or transfers;A little help with bathing/dressing/bathroom;Assistance with cooking/housework;Assist for transportation;Help with stairs or ramp for entrance   Can travel by private vehicle     Yes  Equipment Recommendations  Rolling walker (2 wheels)    Recommendations for Other Services       Precautions / Restrictions  Precautions Precaution/Restrictions Comments: monitor VS, patient hasd a SOB spell abdominal incision with VAC; Restrictions Weight Bearing Restrictions Per Provider Order: No     Mobility  Bed Mobility Overal bed mobility: Modified Independent Bed Mobility: Sit to Supine Rolling: Used rails     Sit to supine: Supervision   General bed mobility comments: patient appropriately lay to side then rolled over to back    Transfers Overall transfer level: Needs assistance Equipment used: Rolling walker (2 wheels) Transfers: Sit to/from Stand Sit to Stand: Supervision, Contact guard assist           General transfer comment: cues for hand placement, steady to stand from rolling chair, Transfer to bed with supervision.    Ambulation/Gait Ambulation/Gait assistance: Supervision, Min assist, +2 safety/equipment Gait Distance (Feet): 200 Feet (then 90') Assistive device: Rolling walker (2 wheels) Gait Pattern/deviations: Step-through pattern       General Gait Details: patient ambukated x 200' then quickly became SOB, flushed, sted he could not catch his breath. A chair brought up, VS taken, patient askeed to amb again, amb. x 90' with similar response, SOB, noted Incr. RR. Chaiir brought up. RN  at his side.   Stairs             Wheelchair Mobility     Tilt Bed    Modified Rankin (Stroke Patients Only)       Balance Overall  balance assessment: Needs assistance Sitting-balance support: Feet supported Sitting balance-Leahy Scale: Normal     Standing balance support: No upper extremity supported Standing balance-Leahy Scale: Good Standing balance comment: was reliant on the Rw during the SOB spells                            Communication Communication Communication: No apparent difficulties  Cognition Arousal: Alert Behavior During Therapy: WFL for tasks assessed/performed   PT - Cognitive impairments: No apparent impairments                          Following commands: Intact Following commands impaired: Follows multi-step commands with increased time    Cueing    Exercises      General Comments        Pertinent Vitals/Pain Pain Assessment Pain Assessment: No/denies pain    Home Living                          Prior Function            PT Goals (current goals can now be found in the care plan section) Progress towards PT goals: Progressing toward goals    Frequency    Min 3X/week      PT Plan      Co-evaluation              AM-PAC PT "6 Clicks" Mobility   Outcome Measure  Help needed turning from your back to your side while in a flat bed without using bedrails?: None Help needed moving from lying on your back to sitting on the side of a flat bed without using bedrails?: None Help needed moving to and from a bed to a chair (including a wheelchair)?: A Little Help needed standing up from a chair using your arms (e.g., wheelchair or bedside chair)?: A Little Help needed to walk in hospital room?: A Little Help needed climbing 3-5 steps with a railing? : A Lot 6 Click Score: 19    End of Session Equipment Utilized During Treatment: Gait belt Activity Tolerance: Treatment limited secondary to medical complications (Comment) Patient left: in bed;with call bell/phone within reach Nurse Communication: Mobility status (SOB,  spell) PT Visit Diagnosis: Difficulty in walking, not elsewhere classified (R26.2);Other abnormalities of gait and mobility (R26.89)     Time: 2440-1027 PT Time Calculation (min) (ACUTE ONLY): 31 min  Charges:    $Gait Training: 23-37 mins PT General Charges $$ ACUTE PT VISIT: 1 Visit                     Abelina Hoes PT Acute Rehabilitation Services Office [phone]    Dareen Ebbing 04/25/2024, 10:55 AM

## 2024-04-25 NOTE — Progress Notes (Signed)
 Nutrition Follow-up  DOCUMENTATION CODES:   Obesity unspecified  INTERVENTION:   -Ensure Plus High Protein po BID, each supplement provides 350 kcal and 20 grams of protein.   NUTRITION DIAGNOSIS:   Inadequate oral intake related to inability to eat as evidenced by NPO status.  Now on solid diet.  GOAL:   Patient will meet greater than or equal to 90% of their needs  Progressing.  MONITOR:   PO intake, Supplement acceptance  ASSESSMENT:   68 y.o. male with a PMH of HTN, thyroid disease, A-fib who presented with abdominal pain. Admitted for small bowel ischemia with septic shock.  5/11 Admit; taken to OR for bowel ischemia, s/p laparoscopic abdominal surgery converted to open for necrotic small bowel - 40cm small bowel resected and notable mesenteric vein thromboses; Left in discontinuity 5/12 Taken back to OR with CCS - ex-lap with another resection of ~40cm of ileum with primary anastomosis and closure of abdomen with wound VAC 5/13 Starting TPN; Extubated 5/17 NGT removed, CLD, patient began vomiting in the evening 5/18 Continued vomiting, NGT placed for LIS 5/21 NGT removed  TPN weaning off today. Pt on heart healthy diet. Consuming 50-100% of meals. Accepting Ensure supplements.  Admission weight: 209 lbs Current weight: 192 lbs  Medications: Vitamin C, Lasix , Multivitamin with minerals daily, Fibercon  Labs reviewed:  CBGs: 118-144 Low Na   Diet Order:   Diet Order             Diet Heart Room service appropriate? Yes; Fluid consistency: Thin  Diet effective now                   EDUCATION NEEDS:   Not appropriate for education at this time  Skin:  Skin Assessment: Skin Integrity Issues: Skin Integrity Issues:: Incisions Incisions: Abdomen  Last BM:  5/23 -type 5  Height:   Ht Readings from Last 1 Encounters:  04/18/24 5\' 9"  (1.753 m)    Weight:   Wt Readings from Last 1 Encounters:  04/25/24 87.4 kg    BMI:  Body mass index is  28.45 kg/m.  Estimated Nutritional Needs:   Kcal:  2000-2250 kcals  Protein:  110-125 grams  Fluid:  >/= 2L   Arna Better, MS, RD, LDN Inpatient Clinical Dietitian Contact via Secure chat

## 2024-04-25 NOTE — Progress Notes (Signed)
 PROGRESS NOTE  Donald Morales  DOB: 1956-07-13  PCP: Rae Bugler, MD WGN:[id number]  DOA: 04/12/2024  LOS: 12 days  Hospital Day: 14  Brief narrative: Donald Morales is a 68 y.o. adult with PMH significant for HTN, HLD, atrial flutter not on anticoagulation, hypothyroidism, BPH. 5/11, patient presented to the ED with progressive abdominal pain for few weeks worse on last few days.  Last BM was 5 days prior to presentation.  CT abdomen showed bowel wall thickening and also raised concern for SMV thrombosis.  Labs showed WC count of 12.1 and lactic acid elevated 2.8 and trended up to 4.9  Patient was seen by general surgery and taken to the OR emergently. Underwent diagnostic laparoscopy, converted to open.  Underwent resection of 40 cm of necrotic small bowel.  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 Left in discontinuity to return to OR in 24-48hrs.  Patient was admitted to ICU postop.  Required 20 phenylephrine  drip. 5/12, taken to OR for further bowel resection and closure of abdomen.  Postop, patient had Afib with RVR required amiodarone .  Eventually weaned off pressors, started on TPN,anticoagulation with IV heparin  drip.  5/14, extubated  5/16, transferred out to [facility]  In the subsequent several days, patient's postop ileus improved. Currently has wound VAC in place and close to disposition.  Subjective: Patient was seen and examined this morning.   Sitting up in recliner.  Feels better. Was about to work with PT.  Later this morning, when he worked with PT.  He felt dizzy, short of breath without significant fluctuations in blood pressure heart rate.  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  Tolerated oral intake and  gradually advanced.  On Eliquis  DC diet today.  TPN to be stopped today. Has wound VAC in place -General Surgery to decide the need of wound VAC at discharge Completed a course of Zosyn . Pain regimen --- Scheduled: None --- PRN: Tylenol , oxycodone, morphine Recent Labs  Lab 04/19/24 0555 04/21/24 0242 04/21/24 0905 04/22/24 0247 04/23/24 0524 04/24/24 0207 04/25/24 0500  WBC 11.4* 18.4*  --  11.1* 10.5 12.5* 8.6  LATICACIDVEN 1.0  --  1.0  --   --   --   --    Acute blood loss anemia Coffee-ground emesis Intraoperative blood loss as well as probably anastomotic blood loss in the postoperative course. Stool remains melanotic-likely clearing up Received 2 units of PRBC transfusion so far. Hemoglobin relatively stable On Protonix  twice daily Remains on heparin  drip with a plan to switch to Eliquis  prior to discharge Recent Labs    04/21/24 1224 04/22/24 0247 04/23/24 0524 04/24/24 0207 04/25/24 0500  HGB 7.0* 8.5* 8.0* 7.7* 7.5*  MCV  --  80.9 83.1 82.2 82.6   Hyponatremia Likely from low intake.  Continue to monitor. Recent Labs  Lab 04/19/24 0555 04/20/24 0355 04/21/24 0242 04/22/24 0247 04/23/24 0524 04/24/24 0207 04/25/24 0500  NA 133* 134* 136 140 134* 131* 132*   Acute worsening of chronic systolic CHF Moderate to severe MR  Hypertension EF in 2023 with 35 to 40%.  Echo 5/11 showed EF of 30 to 35% with regional WMA and moderately reduced RV function, moderate to severe MR, moderate TR. Cardiology following.  Plan for GDMT initiated once oral intake improves.  Currently on metoprolol  and IV Lasix  40 mg daily  A-fib with RVR Has history of A-fib and was seen by EP in the past but did not follow-up. With RVR this hospitalization, patient was started on amiodarone  drip, digoxin  and heparin  drip. Subsequently switched to oral metoprolol .  Currently normal sinus rhythm Anticoagulation plan as above  Hyperglycemia A1c 6.3.  No prior history of diabetes Blood  sugar was elevated because of TPN currently on SSI/Accu-Cheks Recent Labs  Lab 04/24/24 0338 04/24/24 0747 04/24/24 1351 04/24/24 2015 04/25/24 0726  GLUCAP 124* 123* 144* 125* 118*   Hx of hypothyroidism TSH 4.6 Continue oral Synthroid  today.  Acute urinary retention  BPH Patient endorses lower urinary tract symptoms but states he was afraid to go to the urologist.   5/16, started on Flomax .  Able to void without catheter.  Continue to monitor.  Dry cough Since 5/22.  Likely from atelectasis.  Encourage incentive spirometry, Tessalon Perles added as well.  Impaired mobility Seen by PT.  Home health versus SNF depending on progress   Goals of care   Code Status: Full Code     DVT prophylaxis:  SCDs Start: 04/13/24 0500   Antimicrobials: Completed course of Zosyn  Fluid: TPN to stop today Consultants: General Surgery, cardiology  Family Communication: None at bedside  Level of care: Telemetry .    Patient is from: Home Needs to continue in-hospital care: Postop ileus improving.  Physical strength gradually improving.  To be decided by general surgery for appropriateness for discharge Anticipated d/c to: Pending clinical course.  Home health versus SNF.   Diet:  Diet Order             Diet Heart Room service appropriate? Yes; Fluid consistency: Thin  Diet effective now                   Scheduled Meds:  ascorbic acid  500 mg Oral BID   benzonatate  100 mg Oral TID   Chlorhexidine  Gluconate Cloth  6 each Topical Q0600   feeding supplement  237 mL Oral BID BM   [START ON 04/26/2024] ferrous sulfate  325 mg Oral Q breakfast   furosemide   40 mg Intravenous Daily   levothyroxine   50 mcg Oral QAC breakfast   metoprolol  tartrate  50 mg Oral BID   multivitamin with minerals  1 tablet Oral Daily   pantoprazole   40 mg Oral BID AC   pneumococcal 20-valent conjugate vaccine  0.5 mL Intramuscular Tomorrow-1000   polycarbophil  625 mg Oral BID   scopolamine   1  patch Transdermal Q72H   sodium chloride  flush  10-40 mL Intracatheter Q12H   tamsulosin   0.4 mg Oral Daily    PRN meds: acetaminophen , alum & mag hydroxide-simeth, bisacodyl, hydrALAZINE , menthol-cetylpyridinium, morphine injection, ondansetron  (ZOFRAN ) IV, oxyCODONE, phenol, prochlorperazine , sodium chloride  flush   Infusions:   heparin  1,800 Units/hr (04/25/24 0556)   TPN ADULT (ION) 40 mL/hr at 04/24/24 1716    Antimicrobials: Anti-infectives (From admission, onward)    Start     Dose/Rate Route Frequency Ordered Stop   04/21/24 1200  piperacillin -tazobactam (ZOSYN ) IVPB 3.375 g  Status:  Discontinued        3.375 g 12.5 mL/hr over 240 Minutes Intravenous Every 8 hours 04/21/24 1038 04/23/24 1014   04/14/24 1000  vancomycin  (VANCOREADY) IVPB 1500 mg/300 mL  Status:  Discontinued        1,500 mg 150 mL/hr over 120 Minutes Intravenous Every 24 hours 04/13/24 0731 04/15/24  1416   04/14/24 0700  vancomycin  (VANCOREADY) IVPB 1750 mg/350 mL  Status:  Discontinued        1,750 mg 175 mL/hr over 120 Minutes Intravenous Every 24 hours 04/13/24 0547 04/13/24 0731   04/13/24 0615  vancomycin  (VANCOREADY) IVPB 1750 mg/350 mL        1,750 mg 175 mL/hr over 120 Minutes Intravenous  Once 04/13/24 0525 04/13/24 0811   04/13/24 0400  piperacillin -tazobactam (ZOSYN ) IVPB 3.375 g  Status:  Discontinued        3.375 g 100 mL/hr over 30 Minutes Intravenous Every 6 hours 04/13/24 0240 04/13/24 0244   04/13/24 0400  piperacillin -tazobactam (ZOSYN ) IVPB 3.375 g  Status:  Discontinued        3.375 g 12.5 mL/hr over 240 Minutes Intravenous Every 8 hours 04/13/24 0245 04/18/24 0936   04/12/24 2245  piperacillin -tazobactam (ZOSYN ) IVPB 3.375 g        3.375 g 100 mL/hr over 30 Minutes Intravenous  Once 04/12/24 2237 04/12/24 2333       Objective: Vitals:   04/25/24 0918 04/25/24 0944  BP: 120/69 120/69  Pulse: 85 85  Resp: 18   Temp:    SpO2: 99%     Intake/Output Summary (Last 24 hours)  at 04/25/2024 1030 Last data filed at 04/25/2024 1000 Gross per 24 hour  Intake 1848.96 ml  Output 2800 ml  Net -951.04 ml   Filed Weights   04/23/24 0500 04/24/24 0500 04/25/24 0500  Weight: 87 kg 87.4 kg 87.4 kg   Weight change: 0 kg Body mass index is 28.45 kg/m.   Physical Exam: General exam: Pleasant, elderly Caucasian male.  Skin: No rashes, lesions or ulcers. HEENT: Atraumatic, normocephalic, no obvious bleeding Lungs: Clear to auscultation bilaterally CVS: S1, S2, faint pansystolic murmur GI/Abd: Soft, appropriate postop tenderness, nondistended, bowel sound present.  Wound VAC in place.,   CNS: Alert, awake, oriented x 3 Psychiatry: In good spirits today. Extremities: No pedal edema, no calf tenderness  Data Review: I have personally reviewed the laboratory data and studies available.  F/u labs  Unresulted Labs (From admission, onward)     Start     Ordered   04/25/24 1230  Heparin  level (unfractionated)  Once-Timed,   TIMED       Question:  Specimen collection method  Answer:  Unit=Unit collect   04/25/24 0546   04/25/24 0500  Heparin  level (unfractionated)  Daily,   R     Question:  Specimen collection method  Answer:  Unit=Unit collect   04/24/24 0920   04/21/24 0500  CBC  Daily,   R     Question:  Specimen collection method  Answer:  Unit=Unit collect   04/20/24 0426   04/14/24 0500  Creatinine, serum  Daily,   R      04/13/24 0545           Signed, Hoyt Macleod, MD [facility] 04/25/2024

## 2024-04-25 NOTE — Progress Notes (Signed)
 Pharmacy: Re- heparin   Patient is a 68 y.o who is currently on heparin  drip for afib and suspected SMV thrombus.  - heparin  level collected at 8p is slightly supra-therapeutic at 0.56 with heparin  drip infusing at 1800 units/hr - hgb low but stable. Plts up 182K -  Per pt's RN, no issues with IV line and no bleeding noted  Goal of Therapy:  Heparin  level 0.3-0.5 units/ml Monitor platelets by anticoagulation protocol: Yes  Plan: - reduce heparin  drip to 1700 units/hr - check 6 hr heparin  level  Sharlyn Deaner, PharmD, BCPS 04/25/2024 8:44 PM

## 2024-04-25 NOTE — Progress Notes (Signed)
 Occupational Therapy Re-eval/Treatment Patient Details Name: Donald Morales MRN: [id number] DOB: 07/25/1956 Today's Date: 04/25/2024   History of present illness 68 y/o gentleman  who presented with necrotic bowel due to venous obstruction.  On 5/11 he he required resection of necrotic bowel and was left in discontinuity.  On 5/12 he returned to the OR with a small residual area of necrotic tissue removed and was reconnected.  Wound VAC in place.  Transferred back to the ICU for mechanical ventilation. Now NPO with ng tube.  Pt with a history of A-fib not on anticoagulation   OT comments  Patient seen for re-eval/treatment session as per request from MD for dispo clarification recommendations and progression of function. Patient continues to make significant progress and friend bedside who will assist patient upon discharge. Patient now moving to and from bathroom with CGA/S with RW with assist for lines and vac management and completing BADL's on regular toilet and standing sink side with supervision. Fatigues after 4-5 minutes of activity requiring intermittent rests with some mild SOB and cough noted this session but HR remained 88-92 bpm and SpO2 98-100% on RA. Will require shower seat when able to shower following wound vac d/c but for home use rollator vs RW as per patient preference recommended. Patient reinforces his desire to return home, he will remain on 1st level with recliner, sofa and full bath initially, and that he does have enough friend support to assist as needed thus OT supports HHOT recommendation at this time. Will continue to follow acutely to progress safety and functional independence to allow for discharge.       If plan is discharge home, recommend the following:  A little help with walking and/or transfers;A little help with bathing/dressing/bathroom;Assistance with cooking/housework;Assistance with feeding;Direct supervision/assist for medications management;Direct  supervision/assist for financial management;Assist for transportation;Help with stairs or ramp for entrance   Equipment Recommendations   (Rollator vs RW as per patient pref, shower seat but needs sponge bath for now d/t wound vac)       Precautions / Restrictions Precautions Precautions: Other (comment) Recall of Precautions/Restrictions: Intact Precaution/Restrictions Comments: monitor VS, patient had a SOB spell with amb with PT, abdominal incision with VAC Restrictions Weight Bearing Restrictions Per Provider Order: No       Mobility Bed Mobility Overal bed mobility: Modified Independent Bed Mobility: Sit to Supine, Supine to Sit Rolling: Used rails Sidelying to sit: HOB elevated, Used rails Supine to sit: Supervision Sit to supine: Supervision        Transfers Overall transfer level: Needs assistance Equipment used: Rolling walker (2 wheels) Transfers: Sit to/from Stand, Bed to chair/wheelchair/BSC Sit to Stand: Supervision, Contact guard assist     Step pivot transfers: Contact guard assist     General transfer comment: abmb to and from regular toilet and sink in bathroom with support for lines and vac     Balance Overall balance assessment: Needs assistance Sitting-balance support: Feet supported Sitting balance-Leahy Scale: Normal     Standing balance support: No upper extremity supported Standing balance-Leahy Scale: Good                             ADL either performed or assessed with clinical judgement   ADL Overall ADL's : Needs assistance/impaired Eating/Feeding: Independent Eating/Feeding Details (indicate cue type and reason): now on diet with thins Grooming: Wash/dry hands;Wash/dry face;Standing;Supervision/safety Grooming Details (indicate cue type and reason): sink side with therapy managing  lines on pole Upper Body Bathing: Supervision/ safety;Sitting   Lower Body Bathing: Contact guard assist;Sit to/from stand   Upper Body  Dressing : Supervision/safety;Sitting   Lower Body Dressing: Contact guard assist;Sit to/from stand Lower Body Dressing Details (indicate cue type and reason): now able to complete figure 4 completely to don LB garments Toilet Transfer: Supervision/safety;Regular Toilet;Rolling walker (2 wheels)   Toileting- Clothing Manipulation and Hygiene: Supervision/safety;Sit to/from stand       Functional mobility during ADLs: Supervision/safety;Contact guard assist;Rolling walker (2 wheels) General ADL Comments: standing for ADL's now up to 4-5 minutes    Extremity/Trunk Assessment Upper Extremity Assessment Upper Extremity Assessment: Generalized weakness   Lower Extremity Assessment Lower Extremity Assessment: Generalized weakness              Praxis Praxis Praxis: WFL Praxis Impairment Details:  (significant improvement in all motor planning)   Communication Communication Communication: No apparent difficulties   Cognition Arousal: Alert Behavior During Therapy: WFL for tasks assessed/performed Cognition: No apparent impairments             OT - Cognition Comments: patient has cleared for all presented therapy, may trial pill box test next visit                 Following commands: Intact Following commands impaired: Follows multi-step commands with increased time      Cueing   Cueing Techniques: Verbal cues, Tactile cues  Exercises Exercises: Other exercises (UE target toss 6 sets of 2 min seated)       General Comments HR remained 88-92 this session, some occasional SOB noted and mild cough    Pertinent Vitals/ Pain       Pain Assessment Pain Assessment: No/denies pain   Frequency  Min 2X/week        Progress Toward Goals  OT Goals(current goals can now be found in the care plan section)  Progress towards OT goals: Progressing toward goals  Acute Rehab OT Goals Patient Stated Goal: to go home OT Goal Formulation: With patient Time For Goal  Achievement: 05/02/24 Potential to Achieve Goals: Good ADL Goals Pt Will Perform Grooming: with supervision;sitting Pt Will Perform Lower Body Bathing: with contact guard assist;sitting/lateral leans;sit to/from stand;with adaptive equipment Pt Will Perform Lower Body Dressing: with contact guard assist;with adaptive equipment;sitting/lateral leans;sit to/from stand Pt Will Transfer to Toilet: with min assist;ambulating;bedside commode;grab bars;regular height toilet Pt Will Perform Toileting - Clothing Manipulation and hygiene: with contact guard assist;sitting/lateral leans;sit to/from stand Pt Will Perform Tub/Shower Transfer: with contact guard assist;shower seat;rolling walker Pt/caregiver will Perform Home Exercise Program: Increased strength;Both right and left upper extremity;Independently;With written HEP provided Additional ADL Goal #1: patient to demonstrate good safety awareness by answering 4/4 safety questions from the [facility] correctly: 1. What do you do for yourself if you are sick with a cold? 2. What do you do if you burn yourself and the wound becomes infected? 3. What do you do if you experience severe chest pain and shortness of breath? 4. What number do you call in an emergency? In order to determine best discharge plan  Plan         AM-PAC OT "6 Clicks" Daily Activity     Outcome Measure   Help from another person eating meals?: None Help from another person taking care of personal grooming?: None Help from another person toileting, which includes using toliet, bedpan, or urinal?: A Little Help from another person bathing (including washing, rinsing, drying)?: A Little Help  from another person to put on and taking off regular upper body clothing?: A Little Help from another person to put on and taking off regular lower body clothing?: A Little 6 Click Score: 20    End of Session Equipment Utilized During Treatment: Gait belt;Rolling walker (2 wheels)  OT Visit  Diagnosis: Unsteadiness on feet (R26.81);Muscle weakness (generalized) (M62.81);Feeding difficulties (R63.3);Cognitive communication deficit (R41.841)   Activity Tolerance Patient tolerated treatment well   Patient Left in bed;with call bell/phone within reach;with bed alarm set;with family/visitor present   Nurse Communication Mobility status        Time: 1440-1525 OT Time Calculation (min): 45 min  Charges: OT General Charges $OT Visit: 1 Visit OT Treatments $Self Care/Home Management : 23-37 mins $Therapeutic Activity: 8-22 mins  Trejan Buda OT/L Acute Rehabilitation Department  [phone]  04/25/2024, 4:40 PM

## 2024-04-25 NOTE — TOC Progression Note (Signed)
 Transition of Care [facility]) - Progression Note    Patient Details  Name: Donald Morales MRN: [id number] Date of Birth: 03/26/1956  Transition of Care [facility]) CM/SW Contact  Tessie Fila, RN Phone Number: 04/25/2024, 4:29 PM  Clinical Narrative:    Met with pt to discuss PT rec for HHPT. Pt is in agreement with CM setting HHPT. Reached out to Cindie with [facility] for PT services. Awaiting an answer. TOC continuing to follow.   Expected Discharge Plan: Home/Self Care Barriers to Discharge: Continued Medical Work up  Expected Discharge Plan and Services                                               Social Determinants of Health (SDOH) Interventions SDOH Screenings   Food Insecurity: Patient Unable To Answer (04/13/2024)  Housing: Patient Unable To Answer (04/13/2024)  Transportation Needs: Patient Unable To Answer (04/13/2024)  Utilities: Patient Unable To Answer (04/13/2024)  Social Connections: Patient Unable To Answer (04/13/2024)  Tobacco Use: Low Risk  (04/12/2024)    Readmission Risk Interventions    04/16/2024   11:56 AM  Readmission Risk Prevention Plan  Transportation Screening Complete  PCP or Specialist Appt within 5-7 Days Complete  Home Care Screening Complete  Medication Review (RN CM) Complete

## 2024-04-26 ENCOUNTER — Other Ambulatory Visit (HOSPITAL_COMMUNITY): Payer: Self-pay

## 2024-04-26 DIAGNOSIS — I5022 Chronic systolic (congestive) heart failure: Secondary | ICD-10-CM | POA: Diagnosis not present

## 2024-04-26 DIAGNOSIS — D62 Acute posthemorrhagic anemia: Secondary | ICD-10-CM | POA: Diagnosis not present

## 2024-04-26 DIAGNOSIS — K55069 Acute infarction of intestine, part and extent unspecified: Secondary | ICD-10-CM

## 2024-04-26 DIAGNOSIS — I4819 Other persistent atrial fibrillation: Secondary | ICD-10-CM

## 2024-04-26 DIAGNOSIS — K559 Vascular disorder of intestine, unspecified: Secondary | ICD-10-CM | POA: Diagnosis not present

## 2024-04-26 LAB — CBC
HCT: 25.9 % — ABNORMAL LOW (ref 39.0–52.0)
Hemoglobin: 7.9 g/dL — ABNORMAL LOW (ref 13.0–17.0)
MCH: 25.2 pg — ABNORMAL LOW (ref 26.0–34.0)
MCHC: 30.5 g/dL (ref 30.0–36.0)
MCV: 82.7 fL (ref 80.0–100.0)
Platelets: 212 10*3/uL (ref 150–400)
RBC: 3.13 MIL/uL — ABNORMAL LOW (ref 4.22–5.81)
RDW: 21.6 % — ABNORMAL HIGH (ref 11.5–15.5)
WBC: 7.4 10*3/uL (ref 4.0–10.5)
nRBC: 0.3 % — ABNORMAL HIGH (ref 0.0–0.2)

## 2024-04-26 LAB — BASIC METABOLIC PANEL WITH GFR
Anion gap: 7 (ref 5–15)
BUN: 23 mg/dL (ref 8–23)
CO2: 20 mmol/L — ABNORMAL LOW (ref 22–32)
Calcium: 7.3 mg/dL — ABNORMAL LOW (ref 8.9–10.3)
Chloride: 105 mmol/L (ref 98–111)
Creatinine, Ser: 0.9 mg/dL (ref 0.61–1.24)
GFR, Estimated: >60 mL/min (ref >60)
Glucose, Bld: 96 mg/dL (ref 70–99)
Potassium: 3.7 mmol/L (ref 3.5–5.1)
Sodium: 132 mmol/L — ABNORMAL LOW (ref 135–145)

## 2024-04-26 LAB — HEPARIN LEVEL (UNFRACTIONATED): Heparin Unfractionated: 0.64 [IU]/mL (ref 0.30–0.70)

## 2024-04-26 LAB — GLUCOSE, CAPILLARY: Glucose-Capillary: 108 mg/dL — ABNORMAL HIGH (ref 70–99)

## 2024-04-26 MED ORDER — APIXABAN 5 MG PO TABS
5.0000 mg | ORAL_TABLET | Freq: Two times a day (BID) | ORAL | 0 refills | Status: DC
  Filled 2024-04-26: qty 180, 90d supply, fill #0

## 2024-04-26 MED ORDER — CALCIUM POLYCARBOPHIL 625 MG PO TABS
625.0000 mg | ORAL_TABLET | Freq: Two times a day (BID) | ORAL | 0 refills | Status: DC
  Filled 2024-04-26: qty 30, 15d supply, fill #0
  Filled 2024-05-08: qty 60, 30d supply, fill #0

## 2024-04-26 MED ORDER — METOPROLOL SUCCINATE ER 50 MG PO TB24
50.0000 mg | ORAL_TABLET | Freq: Two times a day (BID) | ORAL | 0 refills | Status: DC
  Filled 2024-04-26: qty 90, 45d supply, fill #0

## 2024-04-26 MED ORDER — FUROSEMIDE 40 MG PO TABS
40.0000 mg | ORAL_TABLET | Freq: Every day | ORAL | 0 refills | Status: DC
  Filled 2024-04-26: qty 90, 90d supply, fill #0

## 2024-04-26 MED ORDER — TAMSULOSIN HCL 0.4 MG PO CAPS
0.4000 mg | ORAL_CAPSULE | Freq: Every day | ORAL | 0 refills | Status: DC
  Filled 2024-04-26: qty 90, 90d supply, fill #0

## 2024-04-26 MED ORDER — OXYCODONE HCL 5 MG PO TABS: 5.0000 mg | ORAL_TABLET | Freq: Four times a day (QID) | ORAL | Status: DC | PRN

## 2024-04-26 MED ORDER — METOPROLOL SUCCINATE ER 50 MG PO TB24
50.0000 mg | ORAL_TABLET | Freq: Two times a day (BID) | ORAL | Status: DC
  Administered 2024-04-26 – 2024-04-28 (×5): 50 mg via ORAL
  Filled 2024-04-26 (×5): qty 1

## 2024-04-26 MED ORDER — ASCORBIC ACID 500 MG PO TABS: 500.0000 mg | ORAL_TABLET | Freq: Two times a day (BID) | ORAL | Status: AC

## 2024-04-26 MED ORDER — APIXABAN 5 MG PO TABS
10.0000 mg | ORAL_TABLET | Freq: Two times a day (BID) | ORAL | Status: DC
  Filled 2024-04-26: qty 2

## 2024-04-26 MED ORDER — FUROSEMIDE 40 MG PO TABS
40.0000 mg | ORAL_TABLET | Freq: Every day | ORAL | Status: DC
  Administered 2024-04-26: 40 mg via ORAL
  Filled 2024-04-26: qty 1

## 2024-04-26 MED ORDER — APIXABAN 5 MG PO TABS: 5.0000 mg | ORAL_TABLET | Freq: Two times a day (BID) | ORAL | Status: DC

## 2024-04-26 MED ORDER — PANTOPRAZOLE SODIUM 40 MG PO TBEC
40.0000 mg | DELAYED_RELEASE_TABLET | Freq: Every day | ORAL | 0 refills | Status: DC
  Filled 2024-04-26: qty 30, 30d supply, fill #0

## 2024-04-26 MED ORDER — ADULT MULTIVITAMIN W/MINERALS CH: 1.0000 | ORAL_TABLET | Freq: Every day | ORAL | Status: AC

## 2024-04-26 MED ORDER — APIXABAN 5 MG PO TABS
5.0000 mg | ORAL_TABLET | Freq: Two times a day (BID) | ORAL | Status: DC
  Administered 2024-04-26 – 2024-04-30 (×9): 5 mg via ORAL
  Filled 2024-04-26 (×8): qty 1

## 2024-04-26 NOTE — Progress Notes (Addendum)
 Patient Name: Donald Morales Date of Encounter: 04/26/2024 [facility] Cardiologist: None   Interval Summary  .    BP 96/74.  Creatinine stable at 0.9.  Hemoglobin stable at 7.9.  Denies any chest pain   Vital Signs .    Vitals:   04/25/24 2255 04/26/24 0214 04/26/24 0500 04/26/24 0643  BP: 104/68 100/60  96/74  Pulse:  86  89  Resp:  16  18  Temp:  98.3 F (36.8 C)  98.2 F (36.8 C)  TempSrc:  Oral  Oral  SpO2:  98%  98%  Weight:   86.9 kg   Height:        Intake/Output Summary (Last 24 hours) at 04/26/2024 0850 Last data filed at 04/26/2024 0758 Gross per 24 hour  Intake 1013.91 ml  Output 2750 ml  Net -1736.09 ml      04/26/2024    5:00 AM 04/25/2024    5:00 AM 04/24/2024    5:00 AM  Last 3 Weights  Weight (lbs) 191 lb 9.3 oz 192 lb 10.9 oz 192 lb 10.9 oz  Weight (kg) 86.9 kg 87.4 kg 87.4 kg      Telemetry/ECG    Atrial fibrillation heart rates have generally been in the 80s.- Personally Reviewed  CV Studies    Limited echocardiogram 04/13/2024  1. Left ventricular ejection fraction, by estimation, is 30 to 35%. The  left ventricle has moderately decreased function. The left ventricle  demonstrates regional wall motion abnormalities (see scoring  diagram/findings for description). The left  ventricular internal cavity size was mildly dilated.   2. Right ventricular systolic function is moderately reduced. The right  ventricular size is moderately enlarged.   3. Moderate to severe mitral valve regurgitation.   4. Tricuspid valve regurgitation is moderate.   5. The aortic valve is tricuspid. Aortic valve regurgitation is not  visualized. No aortic stenosis is present.   Physical Exam .   GEN: No acute distress.   Neck: No JVD Cardiac: Irregularly irregular, 2 out of 6 murmur Respiratory: Clear to auscultation bilaterally. GI: Soft, nontender, non-distended  MS: No edema  Patient Profile    Donald Morales is a 68 y.o. adult has hx of  longstanding persistent atrial fibrillation/flutter, HFrEF, hypertension, hyperlipidemia, hypothyroidism.  Patient admitted for complaints of abdominal pain, diagnosed with SMV thrombosis with small bowel ischemia, bacterial translocation and septic shock.  He had surgical resection of the bowel 5/11 and 5/12.  Cardiology asked to see due to atrial fibrillation/CHF  Assessment & Plan .     Longstanding persistent atrial fibrillation Rates overall appear well-controlled, infrequently has elevated heart rates.  But overall in the 80s.  Doing well off of IV amiodarone . Continue with metoprolol , will consolidate to Toprol -XL Hemoglobin appears to have stabilized, will transition from IV heparin  to Eliquis  TSH almost 12 and on synthroid .   Chronic HFrEF Limited echocardiogram this admission shows EF 30 to 35% with wall motion abnormalities in the inferior, anterior, lateral walls.  These wall motion abnormalities are not new though, also documented in March 2023 echo.   RV is moderately reduced.  Overall looks euvolemic.   He is getting IV lasix  40 mg daily.  Appears euvolemic, will transition to p.o. Lasix  Continue metoprolol , will consolidate to Toprol -XL as above.  Soft BP, unable to add further GDMT at this time Cardiomyopathy could be tachycardia induced but will need to rule out obstructive CAD once recovers, can plan outpatient ischemic evaluation  Moderate  to severe MR This is a new finding based off of echocardiogram this admission.  Likely functional in setting of systolic dysfunction.  SMV thrombosis Small bowel ischemia Anemia Management per surgery.  NG tube with very dark fluid.  Hemoglobin dropped from 9.5-7 on 5/19, received 1 unit PRBCs.  Felt to have some anastomotic bleeding.  We will watch this carefully  For questions or updates, please contact [facility] Please consult [email or web address] for contact info under       Signed, Wendie Hamburg, MD

## 2024-04-26 NOTE — Progress Notes (Deleted)
 TOC meds in a secure bag delivered to pt in room by this RN.

## 2024-04-26 NOTE — TOC Progression Note (Addendum)
 Transition of Care [facility]) - Progression Note    Patient Details  Name: Donald Morales MRN: [id number] Date of Birth: February 22, 1956  [facility]) CM/SW Contact  Levie Ream, RN Phone Number: 04/26/2024, 2:06 PM  Clinical Narrative:    Pt will d/c home w/ [employer] wound vac; spoke w/ pt in room; he agreed to recc DME; pt says he will d/c home to [address] [place], [place] [address]; he can be contacted at [phone], [email or web address]; Dr Afton Horse notified orders placed on front of shadow chart on unit; awaiting completion of orders.  -[id number]- orders and face sheet faxed to [facility] [phone]); awaiting electronic confirmation  -1601- electronic confirmation received  Expected Discharge Plan: Home/Self Care Barriers to Discharge: Continued Medical Work up  Expected Discharge Plan and Services         Expected Discharge Date: 04/26/24                         HH Arranged: PT, OT HH Agency: [facility] Date [facility] Agency Contacted: 04/25/24 Time HH Agency Contacted: 1653 Representative spoke with at [facility] Agency: Cindie Sillmon   Social Determinants of Health (SDOH) Interventions SDOH Screenings   Food Insecurity: Patient Unable To Answer (04/13/2024)  Housing: Patient Unable To Answer (04/13/2024)  Transportation Needs: Patient Unable To Answer (04/13/2024)  Utilities: Patient Unable To Answer (04/13/2024)  Social Connections: Patient Unable To Answer (04/13/2024)  Tobacco Use: Low Risk  (04/12/2024)    Readmission Risk Interventions    04/16/2024   11:56 AM  Readmission Risk Prevention Plan  Transportation Screening Complete  PCP or Specialist Appt within 5-7 Days Complete  Home Care Screening Complete  Medication Review (RN CM) Complete

## 2024-04-26 NOTE — Progress Notes (Signed)
 OT Cancellation Note  Patient Details Name: ASHLEY MONTMINY MRN: [id number] DOB: 1955-12-17   Cancelled Treatment:    Reason Eval/Treat Not Completed: Other (comment) Patient was in room upset about possible d/c. Patient declined to engage in ADLs reporting he is at 90% out of 100% with those. Patient verbalized concerns over wound care over holiday weekend that were secure chatted to the nurse. OT to continue to follow as schedule will allow.    Wynette Heckler, MS Acute Rehabilitation Department Office# [phone]   04/26/2024, 2:49 PM

## 2024-04-26 NOTE — Progress Notes (Signed)
 Nurse Notified Pharmacy and IV team regarding possible infiltration swelling to left forearm.  Pt was receiving Heparin  at the moment. It site was assessed by two nurses. Information was reported to oncoming nurse. Patient was complaining of  itching at the site IV was stopped till new IV can be accessed.  Pharmacy is aware of above.  No bleeding at the site or anywhere else.Pt was made aware of all communications.

## 2024-04-26 NOTE — Progress Notes (Signed)
 Progress Note  12 Days Post-Op  Subjective: Tolerating PO, no nausea or pain, ongoing bowel function.   Objective: Vital signs in last 24 hours: Temp:  [98.2 F (36.8 C)-98.9 F (37.2 C)] 98.2 F (36.8 C) (05/24 0643) Pulse Rate:  [67-93] 89 (05/24 0643) Resp:  [16-18] 18 (05/24 0643) BP: (96-120)/(60-75) 96/74 (05/24 0643) SpO2:  [98 %-99 %] 98 % (05/24 0643) Weight:  [86.9 kg] 86.9 kg (05/24 0500) Last BM Date : 04/25/24  Intake/Output from previous day: 05/23 0701 - 05/24 0700 In: 1013.9 [P.O.:360; I.V.:653.9] Out: 2300 [Urine:2300] Intake/Output this shift: No intake/output data recorded.  PE: General: pleasant, WD, WN male who is laying in bed in NAD Heart: irregularly irregular Lungs: Respiratory effort nonlabored Abd: soft, nontender, midline wound with VAC present, Psych: A&Ox3 with an appropriate affect.    Lab Results:  Recent Labs    04/25/24 0500 04/26/24 0221  WBC 8.6 7.4  HGB 7.5* 7.9*  HCT 24.6* 25.9*  PLT 182 212   BMET Recent Labs    04/25/24 0500 04/26/24 0221  NA 132* 132*  K 3.8 3.7  CL 103 105  CO2 23 20*  GLUCOSE 124* 96  BUN 24* 23  CREATININE 0.80 0.90  CALCIUM 7.3* 7.3*   PT/INR No results for input(s): "LABPROT", "INR" in the last 72 hours. CMP     Component Value Date/Time   NA 132 (L) 04/26/2024 0221   K 3.7 04/26/2024 0221   CL 105 04/26/2024 0221   CO2 20 (L) 04/26/2024 0221   GLUCOSE 96 04/26/2024 0221   BUN 23 04/26/2024 0221   CREATININE 0.90 04/26/2024 0221   CALCIUM 7.3 (L) 04/26/2024 0221   PROT 5.5 (L) 04/24/2024 0207   ALBUMIN  2.1 (L) 04/24/2024 0207   AST 31 04/24/2024 0207   ALT 39 04/24/2024 0207   ALKPHOS 79 04/24/2024 0207   BILITOT 1.5 (H) 04/24/2024 0207   GFRNONAA >60 04/26/2024 0221   Lipase     Component Value Date/Time   LIPASE 52 (H) 04/12/2024 2049       Studies/Results: No results found.   Anti-infectives: Anti-infectives (From admission, onward)    Start      Dose/Rate Route Frequency Ordered Stop   04/21/24 1200  piperacillin -tazobactam (ZOSYN ) IVPB 3.375 g  Status:  Discontinued        3.375 g 12.5 mL/hr over 240 Minutes Intravenous Every 8 hours 04/21/24 1038 04/23/24 1014   04/14/24 1000  vancomycin  (VANCOREADY) IVPB 1500 mg/300 mL  Status:  Discontinued        1,500 mg 150 mL/hr over 120 Minutes Intravenous Every 24 hours 04/13/24 0731 04/15/24 1416   04/14/24 0700  vancomycin  (VANCOREADY) IVPB 1750 mg/350 mL  Status:  Discontinued        1,750 mg 175 mL/hr over 120 Minutes Intravenous Every 24 hours 04/13/24 0547 04/13/24 0731   04/13/24 0615  vancomycin  (VANCOREADY) IVPB 1750 mg/350 mL        1,750 mg 175 mL/hr over 120 Minutes Intravenous  Once 04/13/24 0525 04/13/24 0811   04/13/24 0400  piperacillin -tazobactam (ZOSYN ) IVPB 3.375 g  Status:  Discontinued        3.375 g 100 mL/hr over 30 Minutes Intravenous Every 6 hours 04/13/24 0240 04/13/24 0244   04/13/24 0400  piperacillin -tazobactam (ZOSYN ) IVPB 3.375 g  Status:  Discontinued        3.375 g 12.5 mL/hr over 240 Minutes Intravenous Every 8 hours 04/13/24 0245 04/18/24 0936   04/12/24 2245  piperacillin -tazobactam (ZOSYN ) IVPB 3.375 g        3.375 g 100 mL/hr over 30 Minutes Intravenous  Once 04/12/24 2237 04/12/24 2333        Assessment/Plan SMV thrombosis and Small bowel ischemia  POD13 ex-lap, small bowel resection (40 cm), open abdominal VAC Dr. Ramiro Burly POD12 ex-lap, resection (40 cm ileum) with primary anastomosis and closure of abdomen Dr. Afton Horse - tolerating  HH diet, TPN off - hgb stable - mobilize as tolerated  - VAC changes M-R, wrote for home [facility] and [facility] RN. Per report measurements at last change ~ 12 cm x 2 cm x 2.5 cm  FEN: HH diet VTE: hep gtt - ok to switch to DOAC from surgical standpoint  ID: [email or web address]  5/10>5/16  - per TRH -  HFrEF HTN Thyroid disease A. Fib   Okay for discharge surgical standpoint when cleared by primary team.    LOS: 13 days      Adalberto Acton, MD  [facility] 04/26/2024, 7:08 AM Please see Amion for pager number during day hours 7:00am-4:30pm

## 2024-04-26 NOTE — Progress Notes (Signed)
 PROGRESS NOTE  Donald Morales ZOX:[id number] DOB: Jul 01, 1956   PCP: Rae Bugler, MD  Patient is from: Home.  Independently ambulates at baseline.  DOA: 04/12/2024 LOS: 13  Chief complaints Chief Complaint  Patient presents with   Abdominal Pain   Constipation     Brief Narrative / Interim history: 68 year old M with PMH of a flutter not on AC, HFrEF, mod-sev MR, HTN, hypothyroidism, HLD and BPH presented to ED with progressive abdominal pain for weeks, and admitted with ischemic bowel disease and SMV thrombosis as noted on CT abdomen and pelvis.  Patient underwent diagnostic laparoscopy that was converted to open laparotomy with resection of 40 cm necrotic small bowel on 5/11.  Postop, remained intubated and started on phenylephrine  for hypotension and transferred to ICU.  Returned to the OR on 5/12 for further bowel resection and wound VAC placement.  He was extubated on 5/14.  Eventually, he was extubated on 5/14 and transferred to [facility] service on 5/16.  He was also on TPN.   Patient improved gradually.  He tolerated regular diet and came off TPN. Hospital course complicated by A-fib with RVR and acute blood loss anemia/hematemesis.  He was initially treated with IV amiodarone , and transition to Toprol -XL.  Currently, he is rate controlled and hemoglobin is stable.  He is on Eliquis  for anticoagulation.   He is cleared for discharge by cardiology and general surgery.  He will be discharged with wound VAC and home health.   Subjective: Seen and examined earlier this morning.  No major events overnight or this morning.  Has no complaints.  Objective: Vitals:   04/26/24 0500 04/26/24 0643 04/26/24 0919 04/26/24 1344  BP:  96/74 108/63 109/81  Pulse:  89 60 89  Resp:  18 12 18   Temp:  98.2 F (36.8 C)  98.3 F (36.8 C)  TempSrc:  Oral  Oral  SpO2:  98%  100%  Weight: 86.9 kg     Height:        Examination:  GENERAL: No apparent distress.  Nontoxic. HEENT:  MMM.  Vision and hearing grossly intact.  NECK: Supple.  No apparent JVD.  Right IJ CVL RESP:  No IWOB.  Fair aeration bilaterally. CVS: Irregular rhythm.  Normal rate. Heart sounds normal.  ABD/GI/GU: BS+. Abd soft, NTND.  Wound VAC in place. MSK/EXT:  Moves extremities. No apparent deformity. No edema.  SKIN: no apparent skin lesion or wound NEURO: Awake, alert and oriented appropriately.  No apparent focal neuro deficit. PSYCH: Calm. Normal affect.   Consultants:  General surgery Cardiology  Procedures: 5/11-diagnostic laparoscopy, ex lap and resection of 40 cm necrotic small bowel by Dr. Pearlie Bougie  5/12-ex lap with resection of approximately 40 cm of ileum with primary anastomosis and closure of abdomen and placement of subcutaneous wound VAC by Dr. Afton Horse  Microbiology summarized: MRSA PCR screen nonreactive  Assessment and plan: Small bowel ischemia: Presented with progressive abdominal pain.  Her lactic acidosis.  Had CT abdomen and pelvis on 5/10. Unclear why there is no radiology report on CT abdomen and pelvis..  -5/11-diagnostic laparoscopy, ex lap and resection of 40 cm necrotic small bowel by Dr. Pearlie Bougie -5/12-ex lap with resection of approximately 40 cm of ileum with primary anastomosis and closure of abdomen and placement of subcutaneous wound VAC by Dr. Afton Horse -On regular diet.  TPN discontinued. -General Surgery managing.  Wound VAC in place  Septic shock with suspected intra-abdominal infection and bacterial translocation. -Completed course of IV antibiotics with  Zosyn  - Remove central line.  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  Acute blood loss anemia and coffee-ground emesis: Transfused 1 unit.  H&H stable after drop. Recent Labs    04/17/24 0510 04/18/24 0529 04/19/24 0555 04/21/24 0242 04/21/24 1224 04/22/24 0247 04/23/24 0524 04/24/24 0207 04/25/24 0500 04/26/24 0221  HGB 9.9* 10.1* 9.5* 7.0* 7.0* 8.5* 8.0* 7.7* 7.5* 7.9*  - Continue  monitoring    Acute on chronic systolic CHF: TTE with LVEF of 30 to 35%, RWMA, moderate to severe MR, moderate TR and moderately reduced RVSF.  No cardiopulmonary symptoms at the moment.  Appears euvolemic. -Appreciate input by cardiology-on p.o. Lasix  -GDMT-Toprol -XL -Monitor fluid status -Outpatient follow-up with cardiology  Mod-sev MR and moderate TR -Per cardiology   A-fib/flutter with RVR: Was on Cardizem  and amiodarone  before this hospitalization.  He is followed by EP.  Required IV amiodarone  when he went into RVR likely provoked.  Currently rate controlled -Continue Toprol -XL and Eliquis  per cardiology -Optimize electrolytes    Hyperglycemia/prediabetes: A1c 6.3% Recent Labs  Lab 04/24/24 1351 04/24/24 2015 04/25/24 0726 04/25/24 2033 04/26/24 0804  GLUCAP 144* 125* 118* 92 108*  - Monitor CBG with daily labs.   Hx of hypothyroidism: TSH 4.6. -Continue home Synthroid    Acute urinary retention/BPH -Continue Flomax -started you  Inadequate oral intake  Body mass index is 28.29 kg/m. Nutrition Problem: Inadequate oral intake Etiology: inability to eat Signs/Symptoms: NPO status Interventions: Ensure Enlive (each supplement provides 350kcal and 20 grams of protein)   DVT prophylaxis:  SCDs Start: 04/13/24 0500 apixaban  (ELIQUIS ) tablet 5 mg  Code Status: Full code Family Communication: None at bedside Level of care: Telemetry Status is: Inpatient Remains inpatient appropriate because: Needs for wound VAC on discharge   Final disposition: Home with home health and wound VAC.   55 minutes with more than 50% spent in reviewing records, counseling patient/family and coordinating care.   Sch Meds:  Scheduled Meds:  apixaban   5 mg Oral BID   ascorbic acid  500 mg Oral BID   benzonatate  100 mg Oral TID   Chlorhexidine  Gluconate Cloth  6 each Topical Q0600   feeding supplement  237 mL Oral BID BM   ferrous sulfate  325 mg Oral Q breakfast   furosemide    40 mg Oral Daily   levothyroxine   50 mcg Oral QAC breakfast   metoprolol  succinate  50 mg Oral BID   multivitamin with minerals  1 tablet Oral Daily   pantoprazole   40 mg Oral BID AC   pneumococcal 20-valent conjugate vaccine  0.5 mL Intramuscular Tomorrow-1000   polycarbophil  625 mg Oral BID   sodium chloride  flush  10-40 mL Intracatheter Q12H   tamsulosin   0.4 mg Oral Daily   Continuous Infusions: PRN Meds:.acetaminophen , alum & mag hydroxide-simeth, hydrALAZINE , HYDROcodone bit-homatropine, menthol-cetylpyridinium, ondansetron  (ZOFRAN ) IV, oxyCODONE, sodium chloride  flush  Antimicrobials: Anti-infectives (From admission, onward)    Start     Dose/Rate Route Frequency Ordered Stop   04/21/24 1200  piperacillin -tazobactam (ZOSYN ) IVPB 3.375 g  Status:  Discontinued        3.375 g 12.5 mL/hr over 240 Minutes Intravenous Every 8 hours 04/21/24 1038 04/23/24 1014   04/14/24 1000  vancomycin  (VANCOREADY) IVPB 1500 mg/300 mL  Status:  Discontinued        1,500 mg 150 mL/hr over 120 Minutes Intravenous Every 24 hours 04/13/24 0731 04/15/24 1416   04/14/24 0700  vancomycin  (VANCOREADY) IVPB 1750 mg/350 mL  Status:  Discontinued  1,750 mg 175 mL/hr over 120 Minutes Intravenous Every 24 hours 04/13/24 0547 04/13/24 0731   04/13/24 0615  vancomycin  (VANCOREADY) IVPB 1750 mg/350 mL        1,750 mg 175 mL/hr over 120 Minutes Intravenous  Once 04/13/24 0525 04/13/24 0811   04/13/24 0400  piperacillin -tazobactam (ZOSYN ) IVPB 3.375 g  Status:  Discontinued        3.375 g 100 mL/hr over 30 Minutes Intravenous Every 6 hours 04/13/24 0240 04/13/24 0244   04/13/24 0400  piperacillin -tazobactam (ZOSYN ) IVPB 3.375 g  Status:  Discontinued        3.375 g 12.5 mL/hr over 240 Minutes Intravenous Every 8 hours 04/13/24 0245 04/18/24 0936   04/12/24 2245  piperacillin -tazobactam (ZOSYN ) IVPB 3.375 g        3.375 g 100 mL/hr over 30 Minutes Intravenous  Once 04/12/24 2237 04/12/24 2333         I have personally reviewed the following labs and images: CBC: Recent Labs  Lab 04/22/24 0247 04/23/24 0524 04/24/24 0207 04/25/24 0500 04/26/24 0221  WBC 11.1* 10.5 12.5* 8.6 7.4  HGB 8.5* 8.0* 7.7* 7.5* 7.9*  HCT 28.0* 26.0* 25.9* 24.6* 25.9*  MCV 80.9 83.1 82.2 82.6 82.7  PLT 152 127* 146* 182 212   BMP &GFR Recent Labs  Lab 04/20/24 0355 04/21/24 0242 04/22/24 0247 04/23/24 0524 04/24/24 0207 04/25/24 0500 04/26/24 0221  NA 134* 136 140 134* 131* 132* 132*  K 4.4 4.2 4.1 4.3 3.8 3.8 3.7  CL 103 104 110 107 100 103 105  CO2 23 24 21* 21* 24 23 20*  GLUCOSE 146* 164* 128* 168* 121* 124* 96  BUN 35* 38* 33* 30* 27* 24* 23  CREATININE 0.70 0.86 0.70 0.80 0.86 0.80 0.90  CALCIUM 8.4* 8.1* 7.8* 7.7* 7.3* 7.3* 7.3*  MG 2.1 2.0 2.0  --  1.9 2.2  --   PHOS 3.3 3.3 3.4  --  3.5  --   --    Estimated Creatinine Clearance (by C-G formula based on SCr of 0.9 mg/dL) Male: 95.1 mL/min Male: 89.4 mL/min Liver & Pancreas: Recent Labs  Lab 04/21/24 0242 04/22/24 0247 04/23/24 0524 04/24/24 0207  AST 29 37 32 31  ALT 57* 52* 46* 39  ALKPHOS 63 65 67 79  BILITOT 2.0* 2.8* 1.7* 1.5*  PROT 5.8* 5.3* 5.4* 5.5*  ALBUMIN  2.3* 2.2* 2.2* 2.1*   No results for input(s): "LIPASE", "AMYLASE" in the last 168 hours. No results for input(s): "AMMONIA" in the last 168 hours. Diabetic: No results for input(s): "HGBA1C" in the last 72 hours. Recent Labs  Lab 04/24/24 1351 04/24/24 2015 04/25/24 0726 04/25/24 2033 04/26/24 0804  GLUCAP 144* 125* 118* 92 108*   Cardiac Enzymes: No results for input(s): "CKTOTAL", "CKMB", "CKMBINDEX", "TROPONINI" in the last 168 hours. No results for input(s): "PROBNP" in the last 8760 hours. Coagulation Profile: No results for input(s): "INR", "PROTIME" in the last 168 hours. Thyroid Function Tests: No results for input(s): "TSH", "T4TOTAL", "FREET4", "T3FREE", "THYROIDAB" in the last 72 hours. Lipid Profile: No results for input(s):  "CHOL", "HDL", "LDLCALC", "TRIG", "CHOLHDL", "LDLDIRECT" in the last 72 hours. Anemia Panel: No results for input(s): "VITAMINB12", "FOLATE", "FERRITIN", "TIBC", "IRON", "RETICCTPCT" in the last 72 hours. Urine analysis:    Component Value Date/Time   COLORURINE YELLOW 04/13/2024 0612   APPEARANCEUR HAZY (A) 04/13/2024 0612   LABSPEC >1.046 (H) 04/13/2024 0612   PHURINE 5.0 04/13/2024 0612   GLUCOSEU NEGATIVE 04/13/2024 0612   HGBUR  MODERATE (A) 04/13/2024 0612   BILIRUBINUR NEGATIVE 04/13/2024 0612   KETONESUR NEGATIVE 04/13/2024 0612   PROTEINUR 100 (A) 04/13/2024 0612   NITRITE NEGATIVE 04/13/2024 0612   LEUKOCYTESUR NEGATIVE 04/13/2024 0612   Sepsis Labs: Invalid input(s): "PROCALCITONIN", "LACTICIDVEN"  Microbiology: No results found for this or any previous visit (from the past 240 hours).  Radiology Studies: No results found.    Donald Morales T. Bradly Sangiovanni [facility]  If 7PM-7AM, please contact night-coverage [email or web address] 04/26/2024, 4:44 PM

## 2024-04-26 NOTE — Progress Notes (Signed)
 Physical Therapy Treatment Patient Details Name: Donald Morales MRN: [id number] DOB: [birth year] Today's Date: 04/26/2024   History of Present Illness 68 y/o gentleman  who presented with necrotic bowel due to venous obstruction.  On 5/11 he he required resection of necrotic bowel and was left in discontinuity.  On 5/12 he returned to the OR with a small residual area of necrotic tissue removed and was reconnected.  Wound VAC in place.  Transferred back to the ICU for mechanical ventilation. Now NPO with ng tube.  Pt with a history of A-fib not on anticoagulation    PT Comments  Pt progressing well this session; amb ~ 260' with rollator and supervision, no dyspnea or coughing episodes.  Will continue to follow for stair training and ongoing safety/rollator education    If plan is discharge home, recommend the following: A little help with walking and/or transfers;A little help with bathing/dressing/bathroom;Assistance with cooking/housework;Assist for transportation;Help with stairs or ramp for entrance   Can travel by private vehicle     Yes  Equipment Recommendations  Rollator (4 wheels)    Recommendations for Other Services       Precautions / Restrictions Precautions Precautions: Other (comment) Recall of Precautions/Restrictions: Intact Precaution/Restrictions Comments: VAC-abd incision Restrictions Weight Bearing Restrictions Per Provider Order: No     Mobility  Bed Mobility Overal bed mobility: Modified Independent                  Transfers Overall transfer level: Needs assistance Equipment used: Rolling walker (2 wheels), Rollator (4 wheels) Transfers: Sit to/from Stand Sit to Stand: Supervision           General transfer comment: introduced to features of rollator, hand brakes, safety. cues for hand placement, no physical assist    Ambulation/Gait Ambulation/Gait assistance: Supervision Gait Distance (Feet): 250 Feet (10' more) Assistive device:  Rollator (4 wheels) Gait Pattern/deviations: Step-through pattern       General Gait Details: mild initial instability, improved with distance. no LOB, no dyspnea or coughing episodes this session   Stairs             Wheelchair Mobility     Tilt Bed    Modified Rankin (Stroke Patients Only)       Balance   Sitting-balance support: Feet supported Sitting balance-Leahy Scale: Normal     Standing balance support: No upper extremity supported Standing balance-Leahy Scale: Fair Standing balance comment: Fair+. NT to mod challenges - pt is able to amb short distance without device                            Communication Communication Communication: No apparent difficulties  Cognition Arousal: Alert Behavior During Therapy: WFL for tasks assessed/performed   PT - Cognitive impairments: No apparent impairments                         Following commands: Intact Following commands impaired: Follows multi-step commands with increased time    Cueing Cueing Techniques: Verbal cues  Exercises      General Comments        Pertinent Vitals/Pain Pain Assessment Pain Assessment: No/denies pain    Home Living                          Prior Function            PT Goals (current goals can  now be found in the care plan section) Acute Rehab PT Goals Patient Stated Goal: likes to mow the lawn, get home to [name] PT Goal Formulation: With patient Time For Goal Achievement: 04/30/24 Potential to Achieve Goals: Good Progress towards PT goals: Progressing toward goals    Frequency    Min 3X/week      PT Plan      Co-evaluation              AM-PAC PT "6 Clicks" Mobility   Outcome Measure  Help needed turning from your back to your side while in a flat bed without using bedrails?: None Help needed moving from lying on your back to sitting on the side of a flat bed without using bedrails?:  None Help needed moving to and from a bed to a chair (including a wheelchair)?: None Help needed standing up from a chair using your arms (e.g., wheelchair or bedside chair)?: A Little Help needed to walk in hospital room?: A Little Help needed climbing 3-5 steps with a railing? : A Little 6 Click Score: 21    End of Session   Activity Tolerance: Patient tolerated treatment well Patient left: in bed;with call bell/phone within reach;with bed alarm set   PT Visit Diagnosis: Difficulty in walking, not elsewhere classified (R26.2);Other abnormalities of gait and mobility (R26.89)     Time: 1191-4782 PT Time Calculation (min) (ACUTE ONLY): 21 min  Charges:    $Gait Training: 8-22 mins PT General Charges $$ ACUTE PT VISIT: 1 Visit                     Rosalia Mcavoy, PT  Acute Rehab Dept [facility]) [phone]  04/26/2024    [facility] 04/26/2024, 12:28 PM

## 2024-04-26 NOTE — Progress Notes (Signed)
 PHARMACY - ANTICOAGULATION CONSULT NOTE  Pharmacy Consult for Heparin  Indication: atrial fibrillation, SMV thrombosis  Allergies  Allergen Reactions   Aspirin     81 mg - epistaxis/gums bleeding   Codeine     hallucinations    Patient Measurements: Height: 5\' 9"  (175.3 cm) Weight: 87.4 kg (192 lb 10.9 oz) IBW/kg (Calculated) : 70.7 HEPARIN  DW (KG): 88.5  Vital Signs: Temp: 98.3 F (36.8 C) (05/24 0214) Temp Source: Oral (05/24 0214) BP: 100/60 (05/24 0214) Pulse Rate: 86 (05/24 0214)  Labs: Recent Labs    04/23/24 0524 04/23/24 1842 04/24/24 0207 04/24/24 0804 04/25/24 0500 04/25/24 1401 04/25/24 2003 04/26/24 0221  HGB 8.0*  --  7.7*  --  7.5*  --   --  7.9*  HCT 26.0*  --  25.9*  --  24.6*  --   --  25.9*  PLT 127*  --  146*  --  182  --   --  212  HEPARINUNFRC  --    < > 0.32   < > 0.19* 0.41 0.56 0.64  CREATININE 0.80  --  0.86  --  0.80  --   --   --    < > = values in this interval not displayed.    Estimated Creatinine Clearance (by C-G formula based on SCr of 0.8 mg/dL) Male: 40.1 mL/min Male: 100.8 mL/min  Medications:  Infusions:   heparin  1,700 Units/hr (04/26/24 0307)    Assessment: 67 yoM admitted on 5/11 with abdominal pain and AFib with RVR.  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  PMH significant for AFib, but has been off medications/anticoagulation for past 2 years.  Pharmacy is consulted to dose heparin  IV.  Significant events: - 5/12 Heparin  held at 4am for surgery, resumed on 5/12 at 19:00 - 5/19 Holding heparin  infusion for suspected upper GI bleed - 5/21 Heparin  to resume per Dr. Gwynneth Lessen    Today, 04/26/2024: HL 0.64 above goal on 1700 units/hr Hgb low but stable, plts  WNL No bleeding or heparin  related issues per RN  Goal of Therapy:  Heparin  level 0.3-0.5 units/ml Monitor platelets by anticoagulation protocol: Yes   Plan:  Decrease heparin  drip to 1650 units/hr Daily heparin  level and CBC Monitor for signs and  symptoms of bleeding and RN to notify pharmacy and/or MD for any bleeding   Thank you for allowing pharmacy to be a part of this patient's care.  Beau Bound RPh 04/26/2024, 3:09 AM

## 2024-04-26 NOTE — Progress Notes (Addendum)
 PHARMACY - ANTICOAGULATION CONSULT NOTE  Pharmacy Consult for IV Heparin >>Eliquis  Indication: SMV thrombus + h/o afib  Allergies  Allergen Reactions   Aspirin     81 mg - epistaxis/gums bleeding   Codeine     hallucinations    Patient Measurements: Height: 5\' 9"  (175.3 cm) Weight: 86.9 kg (191 lb 9.3 oz) IBW/kg (Calculated) : 70.7 HEPARIN  DW (KG): 88.5  Vital Signs: Temp: 98.2 F (36.8 C) (05/24 0643) Temp Source: Oral (05/24 0643) BP: 96/74 (05/24 0643) Pulse Rate: 89 (05/24 0643)  Labs: Recent Labs    04/24/24 0207 04/24/24 0804 04/25/24 0500 04/25/24 1401 04/25/24 2003 04/26/24 0221  HGB 7.7*  --  7.5*  --   --  7.9*  HCT 25.9*  --  24.6*  --   --  25.9*  PLT 146*  --  182  --   --  212  HEPARINUNFRC 0.32   < > 0.19* 0.41 0.56 0.64  CREATININE 0.86  --  0.80  --   --  0.90   < > = values in this interval not displayed.    Estimated Creatinine Clearance (by C-G formula based on SCr of 0.9 mg/dL) Male: 62.9 mL/min Male: 89.4 mL/min   Medical History: Past Medical History:  Diagnosis Date   BPH (benign prostatic hyperplasia)    History of epistaxis    Hyperlipidemia    Muscle ache    Persistent atrial fibrillation (HCC)    Premature ventricular contraction    RBBB    RBBB    Thyroid disease    Typical atrial flutter (HCC)     Assessment: AC/Heme: AFib w/ RVR, new SMV thrombosis noted in OR 5/11.("Not taking" Eliquis  on med rec PTA and no recent refills noted) - (Off Eliquis  x 2 yrs) - 5/11 OR>>Hep post-op due to risk of further ischemia in small bowel - 5/19: Hgb dropped to 7 - suspected upper GIB - start IV PPI, stop IV UFH  - 5/21: restart Heparin  IV (low goal, no bolus until he can tolerate w/o bleeding) - 5/24: HL 0.64 above goal, adjusted, infiltrated this AM. Surgery ok to change to DOAC, Hgb 7.9, Plts WNL. Start Eliquis   Goal of Therapy:  Therapeutic oral anticoagulation  Plan:  D/c IV heparin  Eliquis  5mg  BID (on IV heparin  since  5/11, no need to load for thrombus) Will need new prescription for discharge   Donald Morales Darcel Early, PharmD, BCPS Clinical Staff Pharmacist Enis Harsh Stillinger 04/26/2024,9:10 AM

## 2024-04-26 NOTE — Progress Notes (Signed)
 TOC meds in a secure bag delivered to pt in room by this RN.

## 2024-04-27 DIAGNOSIS — K559 Vascular disorder of intestine, unspecified: Secondary | ICD-10-CM | POA: Diagnosis not present

## 2024-04-27 DIAGNOSIS — D62 Acute posthemorrhagic anemia: Secondary | ICD-10-CM | POA: Diagnosis not present

## 2024-04-27 DIAGNOSIS — I5022 Chronic systolic (congestive) heart failure: Secondary | ICD-10-CM | POA: Diagnosis not present

## 2024-04-27 DIAGNOSIS — I4819 Other persistent atrial fibrillation: Secondary | ICD-10-CM | POA: Diagnosis not present

## 2024-04-27 LAB — BASIC METABOLIC PANEL WITH GFR
Anion gap: 8 (ref 5–15)
BUN: 21 mg/dL (ref 8–23)
CO2: 20 mmol/L — ABNORMAL LOW (ref 22–32)
Calcium: 7.7 mg/dL — ABNORMAL LOW (ref 8.9–10.3)
Chloride: 104 mmol/L (ref 98–111)
Creatinine, Ser: 1.07 mg/dL (ref 0.61–1.24)
GFR, Estimated: >60 mL/min (ref >60)
Glucose, Bld: 118 mg/dL — ABNORMAL HIGH (ref 70–99)
Potassium: 3.6 mmol/L (ref 3.5–5.1)
Sodium: 132 mmol/L — ABNORMAL LOW (ref 135–145)

## 2024-04-27 LAB — CBC
HCT: 27.3 % — ABNORMAL LOW (ref 39.0–52.0)
Hemoglobin: 8.4 g/dL — ABNORMAL LOW (ref 13.0–17.0)
MCH: 25.4 pg — ABNORMAL LOW (ref 26.0–34.0)
MCHC: 30.8 g/dL (ref 30.0–36.0)
MCV: 82.5 fL (ref 80.0–100.0)
Platelets: 257 10*3/uL (ref 150–400)
RBC: 3.31 MIL/uL — ABNORMAL LOW (ref 4.22–5.81)
RDW: 21.4 % — ABNORMAL HIGH (ref 11.5–15.5)
WBC: 5.5 10*3/uL (ref 4.0–10.5)
nRBC: 0 % (ref 0.0–0.2)

## 2024-04-27 LAB — GLUCOSE, CAPILLARY: Glucose-Capillary: 122 mg/dL — ABNORMAL HIGH (ref 70–99)

## 2024-04-27 LAB — MAGNESIUM: Magnesium: 2.2 mg/dL (ref 1.7–2.4)

## 2024-04-27 MED ORDER — SALINE SPRAY 0.65 % NA SOLN
5.0000 | NASAL | Status: DC | PRN
  Administered 2024-04-27: 5 via NASAL
  Filled 2024-04-27: qty 44

## 2024-04-27 MED ORDER — EMPAGLIFLOZIN 10 MG PO TABS
10.0000 mg | ORAL_TABLET | Freq: Every day | ORAL | Status: DC
  Administered 2024-04-27: 10 mg via ORAL
  Filled 2024-04-27: qty 1

## 2024-04-27 NOTE — Progress Notes (Signed)
 OT Cancellation Note  Patient Details Name: OZELL JUHASZ MRN: [id number] DOB: 29-Apr-1956   Cancelled Treatment:    Reason Eval/Treat Not Completed: Medical issues which prohibited therapy Patient with new onset of orthostatic with PT within last hour. OT to continue to follow and check back as schedule will allow.  Wynette Heckler, MS Acute Rehabilitation Department Office# [phone]  04/27/2024, 12:13 PM

## 2024-04-27 NOTE — Progress Notes (Signed)
 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  On 5/11 he he required resection of necrotic bowel and was left in discontinuity.  On 5/12 he returned to the OR with a small residual area of necrotic tissue removed and was reconnected.  Wound VAC in place.  Transferred back to the ICU for mechanical ventilation.   Pt with a history of A-fib not on anticoagulation    PT Comments  Pt reports dizziness when amb to bathroom earlier with nursing staff; dizzy on initial standing trial today, had pt return to sitting position for BP check. Sitting/standing BP as below, pt symptomatic. Will continue to follow and progress mobility as able      04/27/24 1100  Orthostatic Sitting  BP- Sitting 98/66  Pulse- Sitting 54  Orthostatic Standing at 0 minutes  BP- Standing at 0 minutes (!) 71/55  Pulse- Standing at 0 minutes 88     If plan is discharge home, recommend the following: A little help with walking and/or transfers;A little help with bathing/dressing/bathroom;Assistance with cooking/housework;Assist for transportation;Help with stairs or ramp for entrance   Can travel by private vehicle        Equipment Recommendations  Rollator (4 wheels)    Recommendations for Other Services       Precautions / Restrictions Precautions Precautions: Other (comment) Recall of Precautions/Restrictions: Intact Precaution/Restrictions Comments: VAC-abd incision Restrictions Weight Bearing Restrictions Per Provider Order: No     Mobility  Bed Mobility               General bed mobility comments: in recliner    Transfers Overall transfer level: Needs assistance Equipment used: Rolling walker (2 wheels), Rollator (4 wheels) Transfers: Sit to/from Stand Sit to Stand: Supervision           General transfer  comment: STS from chair. pt dizzy on initial trial;    Ambulation/Gait               General Gait Details: deferred d/t orthostatic   Stairs             Wheelchair Mobility     Tilt Bed    Modified Rankin (Stroke Patients Only)       Balance Overall balance assessment: Needs assistance Sitting-balance support: Feet supported Sitting balance-Leahy Scale: Normal     Standing balance support: No upper extremity supported Standing balance-Leahy Scale: Fair                              Hotel manager: No apparent difficulties  Cognition Arousal: Alert Behavior During Therapy: WFL for tasks assessed/performed   PT - Cognitive impairments: No apparent impairments                         Following commands: Intact Following commands impaired: Follows multi-step commands with increased time    Cueing Cueing Techniques: Verbal cues  Exercises General Exercises - Lower Extremity Ankle Circles/Pumps: AROM, Both, 10 reps, Supine Quad Sets: AROM, Both, 5 reps    General Comments        Pertinent Vitals/Pain Pain Assessment Pain Assessment: No/denies pain    Home Living  Prior Function            PT Goals (current goals can now be found in the care plan section) Acute Rehab PT Goals Patient Stated Goal: likes to mow the lawn, get home to 2 dogs Donald Morales and Donald Morales PT Goal Formulation: With patient Time For Goal Achievement: 04/30/24 Potential to Achieve Goals: Good Progress towards PT goals: Progressing toward goals    Frequency    Min 3X/week      PT Plan      Co-evaluation              AM-PAC PT "6 Clicks" Mobility   Outcome Measure  Help needed turning from your back to your side while in a flat bed without using bedrails?: None Help needed moving from lying on your back to sitting on the side of a flat bed without using bedrails?: None Help  needed moving to and from a bed to a chair (including a wheelchair)?: None Help needed standing up from a chair using your arms (e.g., wheelchair or bedside chair)?: A Little Help needed to walk in hospital room?: A Little Help needed climbing 3-5 steps with a railing? : A Little 6 Click Score: 21    End of Session         PT Visit Diagnosis: Difficulty in walking, not elsewhere classified (R26.2);Other abnormalities of gait and mobility (R26.89)     Time: 4098-1191 PT Time Calculation (min) (ACUTE ONLY): 15 min  Charges:    $Therapeutic Activity: 8-22 mins PT General Charges $$ ACUTE PT VISIT: 1 Visit                     Donald Morales, PT  Acute Rehab Dept [facility]) [phone]  04/27/2024    [facility] 04/27/2024, 11:36 AM

## 2024-04-27 NOTE — Progress Notes (Addendum)
 Progress Note  13 Days Post-Op  Subjective: No new issues.  Discharge yesterday hindered by DME issues.  Objective: Vital signs in last 24 hours: Temp:  [97.9 F (36.6 C)-98.6 F (37 C)] 97.9 F (36.6 C) (05/25 0844) Pulse Rate:  [43-92] 56 (05/25 0844) Resp:  [12-18] 16 (05/25 0844) BP: (101-114)/(63-81) 101/69 (05/25 0844) SpO2:  [99 %-100 %] 100 % (05/25 0844) Last BM Date : 04/26/24  Intake/Output from previous day: 05/24 0701 - 05/25 0700 In: 340 [P.O.:340] Out: 450 [Urine:450] Intake/Output this shift: No intake/output data recorded.  PE: General: pleasant, WD, WN male who is laying in bed in NAD Heart: irregularly irregular Lungs: Respiratory effort nonlabored Abd: soft, nontender, midline wound with VAC present, Psych: A&Ox3 with an appropriate affect.    Lab Results:  Recent Labs    04/26/24 0221 04/27/24 0503  WBC 7.4 5.5  HGB 7.9* 8.4*  HCT 25.9* 27.3*  PLT 212 257   BMET Recent Labs    04/26/24 0221 04/27/24 0503  NA 132* 132*  K 3.7 3.6  CL 105 104  CO2 20* 20*  GLUCOSE 96 118*  BUN 23 21  CREATININE 0.90 1.07  CALCIUM 7.3* 7.7*   PT/INR No results for input(s): "LABPROT", "INR" in the last 72 hours. CMP     Component Value Date/Time   NA 132 (L) 04/27/2024 0503   K 3.6 04/27/2024 0503   CL 104 04/27/2024 0503   CO2 20 (L) 04/27/2024 0503   GLUCOSE 118 (H) 04/27/2024 0503   BUN 21 04/27/2024 0503   CREATININE 1.07 04/27/2024 0503   CALCIUM 7.7 (L) 04/27/2024 0503   PROT 5.5 (L) 04/24/2024 0207   ALBUMIN  2.1 (L) 04/24/2024 0207   AST 31 04/24/2024 0207   ALT 39 04/24/2024 0207   ALKPHOS 79 04/24/2024 0207   BILITOT 1.5 (H) 04/24/2024 0207   GFRNONAA >60 04/27/2024 0503   Lipase     Component Value Date/Time   LIPASE 52 (H) 04/12/2024 2049       Studies/Results: No results found.   Anti-infectives: Anti-infectives (From admission, onward)    Start     Dose/Rate Route Frequency Ordered Stop   04/21/24 1200   piperacillin -tazobactam (ZOSYN ) IVPB 3.375 g  Status:  Discontinued        3.375 g 12.5 mL/hr over 240 Minutes Intravenous Every 8 hours 04/21/24 1038 04/23/24 1014   04/14/24 1000  vancomycin  (VANCOREADY) IVPB 1500 mg/300 mL  Status:  Discontinued        1,500 mg 150 mL/hr over 120 Minutes Intravenous Every 24 hours 04/13/24 0731 04/15/24 1416   04/14/24 0700  vancomycin  (VANCOREADY) IVPB 1750 mg/350 mL  Status:  Discontinued        1,750 mg 175 mL/hr over 120 Minutes Intravenous Every 24 hours 04/13/24 0547 04/13/24 0731   04/13/24 0615  vancomycin  (VANCOREADY) IVPB 1750 mg/350 mL        1,750 mg 175 mL/hr over 120 Minutes Intravenous  Once 04/13/24 0525 04/13/24 0811   04/13/24 0400  piperacillin -tazobactam (ZOSYN ) IVPB 3.375 g  Status:  Discontinued        3.375 g 100 mL/hr over 30 Minutes Intravenous Every 6 hours 04/13/24 0240 04/13/24 0244   04/13/24 0400  piperacillin -tazobactam (ZOSYN ) IVPB 3.375 g  Status:  Discontinued        3.375 g 12.5 mL/hr over 240 Minutes Intravenous Every 8 hours 04/13/24 0245 04/18/24 0936   04/12/24 2245  piperacillin -tazobactam (ZOSYN ) IVPB 3.375 g  3.375 g 100 mL/hr over 30 Minutes Intravenous  Once 04/12/24 2237 04/12/24 2333        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 Ramiro Burly POD13 ex-lap, resection (40 cm ileum) with primary anastomosis and closure of abdomen Dr. Afton Horse - tolerating  HH diet, TPN off - hgb stable - mobilize as tolerated  - VAC changes M-R, wrote for home [facility] and [facility] RN. Per report measurements at last change ~ 12 cm x 2 cm x 2.5 cm.    FEN: HH diet VTE: Eliquis  ID: [email or web address]  5/10>5/16  - per [facility] -  HFrEF HTN Thyroid disease A. Fib   Okay for discharge surgical standpoint when cleared by primary team. Would change vac prior to discharge if he is going home today since tomorrow is a holiday.  If he will still be here tomorrow  can change back as scheduled Monday.  Surgery team will see as needed going forward, please call us  if there are questions or concerns.   LOS: 14 days     Adalberto Acton, MD  [facility] Surgery 04/27/2024, 9:07 AM Please see Amion for pager number during day hours 7:00am-4:30pm

## 2024-04-27 NOTE — Plan of Care (Signed)
  Problem: Clinical Measurements: Goal: Ability to maintain clinical measurements within normal limits will improve Outcome: Progressing   Problem: Clinical Measurements: Goal: Will remain free from infection Outcome: Progressing   Problem: Clinical Measurements: Goal: Respiratory complications will improve Outcome: Progressing   Problem: Activity: Goal: Risk for activity intolerance will decrease Outcome: Progressing   Problem: Nutrition: Goal: Adequate nutrition will be maintained Outcome: Progressing   Problem: Pain Managment: Goal: General experience of comfort will improve and/or be controlled Outcome: Progressing   Problem: Safety: Goal: Ability to remain free from injury will improve Outcome: Progressing

## 2024-04-27 NOTE — Progress Notes (Addendum)
 Patient Name: Donald Morales Date of Encounter: 04/27/2024 [facility] Cardiologist: None   Interval Summary  .    BP 105/66.  Creatinine stable at 1.07.  Hemoglobin stable at 8.4.  Denies any chest pain or dyspnea   Vital Signs .    Vitals:   04/26/24 1344 04/26/24 2012 04/26/24 2016 04/27/24 0546  BP: 109/81 114/69  105/66  Pulse: 89 (!) 43 92 82  Resp: 18 17  17   Temp: 98.3 F (36.8 C) 98.4 F (36.9 C)  98.6 F (37 C)  TempSrc: Oral Oral    SpO2: 100% 100% 100% 99%  Weight:      Height:        Intake/Output Summary (Last 24 hours) at 04/27/2024 0744 Last data filed at 04/26/2024 1344 Gross per 24 hour  Intake 340 ml  Output 450 ml  Net -110 ml      04/26/2024    5:00 AM 04/25/2024    5:00 AM 04/24/2024    5:00 AM  Last 3 Weights  Weight (lbs) 191 lb 9.3 oz 192 lb 10.9 oz 192 lb 10.9 oz  Weight (kg) 86.9 kg 87.4 kg 87.4 kg      Telemetry/ECG    Off telemetry- Personally Reviewed  CV Studies    Limited echocardiogram 04/13/2024  1. Left ventricular ejection fraction, by estimation, is 30 to 35%. The  left ventricle has moderately decreased function. The left ventricle  demonstrates regional wall motion abnormalities (see scoring  diagram/findings for description). The left  ventricular internal cavity size was mildly dilated.   2. Right ventricular systolic function is moderately reduced. The right  ventricular size is moderately enlarged.   3. Moderate to severe mitral valve regurgitation.   4. Tricuspid valve regurgitation is moderate.   5. The aortic valve is tricuspid. Aortic valve regurgitation is not  visualized. No aortic stenosis is present.   Physical Exam .   GEN: No acute distress.   Neck: No JVD Cardiac: Irregularly irregular, 2 out of 6 murmur Respiratory: Clear to auscultation bilaterally. GI: Soft, nontender, non-distended  MS: No edema  Patient Profile    Donald Morales is a 68 y.o. adult has hx of longstanding  persistent atrial fibrillation/flutter, HFrEF, hypertension, hyperlipidemia, hypothyroidism.  Patient admitted for complaints of abdominal pain, diagnosed with SMV thrombosis with small bowel ischemia, bacterial translocation and septic shock.  He had surgical resection of the bowel 5/11 and 5/12.  Cardiology asked to see due to atrial fibrillation/CHF  Assessment & Plan .     Longstanding persistent atrial fibrillation Rates overall appear well-controlled, infrequently has elevated heart rates.  But overall in the 80s.  Doing well off of IV amiodarone . Continue Toprol -XL Continue Eliquis  TSH almost 12 and on synthroid .   Chronic HFrEF Limited echocardiogram this admission shows EF 30 to 35% with wall motion abnormalities in the inferior, anterior, lateral walls.  These wall motion abnormalities are not new though, also documented in March 2023 echo.   RV is moderately reduced.  Overall looks euvolemic.   Continue Toprol -XL Cardiomyopathy could be tachycardia induced but will need to rule out obstructive CAD once recovers, can plan outpatient ischemic evaluation Had converted to PO lasix  but positive orthostatics today, suspect overdiuresed.  Would hold lasix .  Hold off on GDMT given soft BP.  Plan discharge on Lasix  40 mg daily as needed.  Would monitor daily weights and take if gains more than 3 pounds in 1 day or 5 pounds in  1 week  Moderate to severe MR This is a new finding based off of echocardiogram this admission.  Likely functional in setting of systolic dysfunction.  SMV thrombosis Small bowel ischemia Anemia Management per surgery.  Hemoglobin dropped from 9.5-7 on 5/19, received 1 unit PRBCs.  Felt to have some anastomotic bleeding.  Hemoglobin is stabilized, 8.4 this morning.  He has been switched from heparin  drip to Eliquis      For questions or updates, please contact [facility] Please consult [email or web address] for contact info under       Signed, Wendie Hamburg, MD

## 2024-04-27 NOTE — Progress Notes (Signed)
 PROGRESS NOTE  Donald Morales WJX:[id number] DOB: 20-Jun-1956   PCP: Rae Bugler, MD  Patient is from: Home.  Independently ambulates at baseline.  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  Patient underwent diagnostic laparoscopy that was converted to open laparotomy with resection of 40 cm necrotic small bowel on 5/11.  Postop, remained intubated and started on phenylephrine  for hypotension and transferred to ICU.  Returned to the OR on 5/12 for further bowel resection and wound VAC placement.  He was extubated on 5/14.  Eventually, he was extubated on 5/14 and transferred to [facility] service on 5/16.  He was also on TPN.   Patient improved gradually.  He tolerated regular diet and came off TPN. Hospital course complicated by A-fib with RVR and acute blood loss anemia/hematemesis.  He was initially treated with IV amiodarone , and transition to Toprol -XL.  Currently, he is rate controlled and hemoglobin is stable.  He is on Eliquis  for anticoagulation.   He is cleared for discharge by cardiology and general surgery.  He will be discharged with wound VAC and home health.   Subjective: Seen and examined earlier this morning.  No major events overnight or this morning.  Difficulty breathing with ambulation this morning due to nasal congestion.  He noted some blood when he blew his nose.  Positive orthostatic vitals with PT today.  Objective: Vitals:   04/27/24 0844 04/27/24 0930 04/27/24 1205 04/27/24 1342  BP: 101/69  107/76 114/80  Pulse: (!) 56 90 75 (!) 48  Resp: 16  17 16   Temp: 97.9 F (36.6 C)   98.1 F (36.7  C)  TempSrc: Oral   Oral  SpO2: 100%  99% 98%  Weight:      Height:        Examination:  GENERAL: No apparent distress.  Nontoxic. HEENT: MMM.  Vision and hearing grossly intact.  Right septal bruise and small blood clot in left naris NECK: Supple.  No apparent JVD.  Right IJ CVL RESP:  No IWOB.  Fair aeration bilaterally. CVS: Irregular rhythm.  Normal rate. Heart sounds normal.  ABD/GI/GU: BS+. Abd soft, NTND.  Wound VAC in place. MSK/EXT:  Moves extremities. No apparent deformity. No edema.  SKIN: no apparent skin lesion or wound NEURO: Awake, alert and oriented appropriately.  No apparent focal neuro deficit. PSYCH: Calm. Normal affect.   Consultants:  General surgery Cardiology  Procedures: 5/11-diagnostic laparoscopy, ex lap and resection of 40 cm necrotic small bowel by Dr. Pearlie Bougie  5/12-ex lap with resection of approximately 40 cm of ileum with primary anastomosis and closure of abdomen and placement of subcutaneous wound VAC by Dr. Afton Horse  Microbiology summarized: MRSA PCR screen nonreactive  Assessment and plan: Small bowel ischemia: Presented with progressive abdominal pain.  Her lactic acidosis.  Had CT abdomen and pelvis on 5/10. Unclear why there is no radiology report on CT abdomen and pelvis..  -5/11-diagnostic laparoscopy, ex lap and resection of 40 cm necrotic small bowel by Dr. Pearlie Bougie -5/12-ex lap with resection of approximately 40 cm of ileum with primary anastomosis and closure of abdomen and placement of subcutaneous  wound VAC by Dr. Afton Horse -On regular diet.  TPN discontinued. -General Surgery managing.  Wound VAC in place  Septic shock with suspected intra-abdominal infection and bacterial translocation. -Completed course of IV antibiotics with Zosyn  - Remove central line.  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  Acute blood loss anemia and coffee-ground emesis: Transfused 1 unit.  H&H stable after drop. Recent Labs    04/18/24 0529  04/19/24 0555 04/21/24 0242 04/21/24 1224 04/22/24 0247 04/23/24 0524 04/24/24 0207 04/25/24 0500 04/26/24 0221 04/27/24 0503  HGB 10.1* 9.5* 7.0* 7.0* 8.5* 8.0* 7.7* 7.5* 7.9* 8.4*  - Continue monitoring    Acute on chronic systolic CHF: TTE with LVEF of 30 to 35%, RWMA, moderate to severe MR, moderate TR and moderately reduced RVSF.  No cardiopulmonary symptoms at the moment.  Appears euvolemic.  Now with orthostatic vitals. -Card stopped Lasix  and recommended Lasix  40 mg daily PRN if for wt gain > 3 pds in 1 day or 5 lbs in 1 week, Toprol -XL 50 mg twice daily and outpatient follow-up for ischemic evaluation -Strict intake and output, renal functions and electrolytes -Outpatient follow-up with cardiology for ischemic of duration  Mod-sev MR and moderate TR -Per cardiology   A-fib/flutter with RVR: Was on Cardizem  and amiodarone  before this hospitalization.  He is followed by EP.  Required IV amiodarone  when he went into RVR likely provoked.  Currently rate controlled -Continue Toprol -XL and Eliquis  per cardiology -Optimize electrolytes  Orthostatic hypotension: Suspected to be due to overdiuresis. - Diuretics held.   Hyperglycemia/prediabetes: A1c 6.3% Recent Labs  Lab 04/24/24 2015 04/25/24 0726 04/25/24 2033 04/26/24 0804 04/27/24 0857  GLUCAP 125* 118* 92 108* 122*  - Monitor CBG with daily labs.  Hx of hypothyroidism: TSH 4.6. -Continue home Synthroid    Acute urinary retention/BPH -Continue Flomax -started you  Epistaxis/nasal congestion: Monitor.  Recommended not blowing his nose aggressively -Saline nose spray  Inadequate oral intake  Body mass index is 28.29 kg/m. Nutrition Problem: Inadequate oral intake Etiology: inability to eat Signs/Symptoms: NPO status Interventions: Ensure Enlive (each supplement provides 350kcal and 20 grams of protein)   DVT prophylaxis:  SCDs Start: 04/13/24 0500 apixaban  (ELIQUIS ) tablet 5 mg  Code Status: Full  code Family Communication: None at bedside Level of care: Med-Surg Status is: Inpatient Remains inpatient appropriate because: Needs for wound VAC on discharge   Final disposition: Home with home health and wound VAC.   35 minutes with more than 50% spent in reviewing records, counseling patient/family and coordinating care.   Sch Meds:  Scheduled Meds:  apixaban   5 mg Oral BID   ascorbic acid  500 mg Oral BID   Chlorhexidine  Gluconate Cloth  6 each Topical Q0600   feeding supplement  237 mL Oral BID BM   ferrous sulfate  325 mg Oral Q breakfast   levothyroxine   50 mcg Oral QAC breakfast   metoprolol  succinate  50 mg Oral BID   multivitamin with minerals  1 tablet Oral Daily   pantoprazole   40 mg Oral BID AC   pneumococcal 20-valent conjugate vaccine  0.5 mL Intramuscular Tomorrow-1000   polycarbophil  625 mg Oral BID   sodium chloride  flush  10-40 mL Intracatheter Q12H   tamsulosin   0.4 mg Oral Daily   Continuous Infusions: PRN Meds:.acetaminophen , alum & mag hydroxide-simeth, hydrALAZINE , menthol-cetylpyridinium, ondansetron  (ZOFRAN ) IV, sodium chloride , sodium chloride  flush  Antimicrobials: Anti-infectives (From admission, onward)    Start     Dose/Rate Route Frequency Ordered Stop   04/21/24  1200  piperacillin -tazobactam (ZOSYN ) IVPB 3.375 g  Status:  Discontinued        3.375 g 12.5 mL/hr over 240 Minutes Intravenous Every 8 hours 04/21/24 1038 04/23/24 1014   04/14/24 1000  vancomycin  (VANCOREADY) IVPB 1500 mg/300 mL  Status:  Discontinued        1,500 mg 150 mL/hr over 120 Minutes Intravenous Every 24 hours 04/13/24 0731 04/15/24 1416   04/14/24 0700  vancomycin  (VANCOREADY) IVPB 1750 mg/350 mL  Status:  Discontinued        1,750 mg 175 mL/hr over 120 Minutes Intravenous Every 24 hours 04/13/24 0547 04/13/24 0731   04/13/24 0615  vancomycin  (VANCOREADY) IVPB 1750 mg/350 mL        1,750 mg 175 mL/hr over 120 Minutes Intravenous  Once 04/13/24 0525 04/13/24 0811    04/13/24 0400  piperacillin -tazobactam (ZOSYN ) IVPB 3.375 g  Status:  Discontinued        3.375 g 100 mL/hr over 30 Minutes Intravenous Every 6 hours 04/13/24 0240 04/13/24 0244   04/13/24 0400  piperacillin -tazobactam (ZOSYN ) IVPB 3.375 g  Status:  Discontinued        3.375 g 12.5 mL/hr over 240 Minutes Intravenous Every 8 hours 04/13/24 0245 04/18/24 0936   04/12/24 2245  piperacillin -tazobactam (ZOSYN ) IVPB 3.375 g        3.375 g 100 mL/hr over 30 Minutes Intravenous  Once 04/12/24 2237 04/12/24 2333        I have personally reviewed the following labs and images: CBC: Recent Labs  Lab 04/23/24 0524 04/24/24 0207 04/25/24 0500 04/26/24 0221 04/27/24 0503  WBC 10.5 12.5* 8.6 7.4 5.5  HGB 8.0* 7.7* 7.5* 7.9* 8.4*  HCT 26.0* 25.9* 24.6* 25.9* 27.3*  MCV 83.1 82.2 82.6 82.7 82.5  PLT 127* 146* 182 212 257   BMP &GFR Recent Labs  Lab 04/21/24 0242 04/22/24 0247 04/23/24 0524 04/24/24 0207 04/25/24 0500 04/26/24 0221 04/27/24 0503  NA 136 140 134* 131* 132* 132* 132*  K 4.2 4.1 4.3 3.8 3.8 3.7 3.6  CL 104 110 107 100 103 105 104  CO2 24 21* 21* 24 23 20* 20*  GLUCOSE 164* 128* 168* 121* 124* 96 118*  BUN 38* 33* 30* 27* 24* 23 21  CREATININE 0.86 0.70 0.80 0.86 0.80 0.90 1.07  CALCIUM 8.1* 7.8* 7.7* 7.3* 7.3* 7.3* 7.7*  MG 2.0 2.0  --  1.9 2.2  --  2.2  PHOS 3.3 3.4  --  3.5  --   --   --    Estimated Creatinine Clearance (by C-G formula based on SCr of 1.07 mg/dL) Male: 16.1 mL/min Male: 75.2 mL/min Liver & Pancreas: Recent Labs  Lab 04/21/24 0242 04/22/24 0247 04/23/24 0524 04/24/24 0207  AST 29 37 32 31  ALT 57* 52* 46* 39  ALKPHOS 63 65 67 79  BILITOT 2.0* 2.8* 1.7* 1.5*  PROT 5.8* 5.3* 5.4* 5.5*  ALBUMIN  2.3* 2.2* 2.2* 2.1*   No results for input(s): "LIPASE", "AMYLASE" in the last 168 hours. No results for input(s): "AMMONIA" in the last 168 hours. Diabetic: No results for input(s): "HGBA1C" in the last 72 hours. Recent Labs  Lab  04/24/24 2015 04/25/24 0726 04/25/24 2033 04/26/24 0804 04/27/24 0857  GLUCAP 125* 118* 92 108* 122*   Cardiac Enzymes: No results for input(s): "CKTOTAL", "CKMB", "CKMBINDEX", "TROPONINI" in the last 168 hours. No results for input(s): "PROBNP" in the last 8760 hours. Coagulation Profile: No results for input(s): "INR", "PROTIME" in the last 168 hours.  Thyroid Function Tests: No results for input(s): "TSH", "T4TOTAL", "FREET4", "T3FREE", "THYROIDAB" in the last 72 hours. Lipid Profile: No results for input(s): "CHOL", "HDL", "LDLCALC", "TRIG", "CHOLHDL", "LDLDIRECT" in the last 72 hours. Anemia Panel: No results for input(s): "VITAMINB12", "FOLATE", "FERRITIN", "TIBC", "IRON", "RETICCTPCT" in the last 72 hours. Urine analysis:    Component Value Date/Time   COLORURINE YELLOW 04/13/2024 0612   APPEARANCEUR HAZY (A) 04/13/2024 0612   LABSPEC >1.046 (H) 04/13/2024 0612   PHURINE 5.0 04/13/2024 0612   GLUCOSEU NEGATIVE 04/13/2024 0612   HGBUR MODERATE (A) 04/13/2024 0612   BILIRUBINUR NEGATIVE 04/13/2024 0612   KETONESUR NEGATIVE 04/13/2024 0612   PROTEINUR 100 (A) 04/13/2024 0612   NITRITE NEGATIVE 04/13/2024 0612   LEUKOCYTESUR NEGATIVE 04/13/2024 0612   Sepsis Labs: Invalid input(s): "PROCALCITONIN", "LACTICIDVEN"  Microbiology: No results found for this or any previous visit (from the past 240 hours).  Radiology Studies: No results found.    Millie Forde T. [facility]  If 7PM-7AM, please contact night-coverage [email or web address] 04/27/2024, 5:21 PM

## 2024-04-28 DIAGNOSIS — I5022 Chronic systolic (congestive) heart failure: Secondary | ICD-10-CM | POA: Diagnosis not present

## 2024-04-28 DIAGNOSIS — D62 Acute posthemorrhagic anemia: Secondary | ICD-10-CM | POA: Diagnosis not present

## 2024-04-28 DIAGNOSIS — I4819 Other persistent atrial fibrillation: Secondary | ICD-10-CM | POA: Diagnosis not present

## 2024-04-28 DIAGNOSIS — K559 Vascular disorder of intestine, unspecified: Secondary | ICD-10-CM | POA: Diagnosis not present

## 2024-04-28 LAB — BASIC METABOLIC PANEL WITH GFR
Anion gap: 8 (ref 5–15)
BUN: 17 mg/dL (ref 8–23)
CO2: 20 mmol/L — ABNORMAL LOW (ref 22–32)
Calcium: 7.7 mg/dL — ABNORMAL LOW (ref 8.9–10.3)
Chloride: 104 mmol/L (ref 98–111)
Creatinine, Ser: 1.13 mg/dL (ref 0.61–1.24)
GFR, Estimated: >60 mL/min (ref >60)
Glucose, Bld: 112 mg/dL — ABNORMAL HIGH (ref 70–99)
Potassium: 3.7 mmol/L (ref 3.5–5.1)
Sodium: 132 mmol/L — ABNORMAL LOW (ref 135–145)

## 2024-04-28 LAB — CBC
HCT: 28.3 % — ABNORMAL LOW (ref 39.0–52.0)
Hemoglobin: 8.7 g/dL — ABNORMAL LOW (ref 13.0–17.0)
MCH: 25.4 pg — ABNORMAL LOW (ref 26.0–34.0)
MCHC: 30.7 g/dL (ref 30.0–36.0)
MCV: 82.5 fL (ref 80.0–100.0)
Platelets: 301 10*3/uL (ref 150–400)
RBC: 3.43 MIL/uL — ABNORMAL LOW (ref 4.22–5.81)
RDW: 21.6 % — ABNORMAL HIGH (ref 11.5–15.5)
WBC: 5.5 10*3/uL (ref 4.0–10.5)
nRBC: 0 % (ref 0.0–0.2)

## 2024-04-28 LAB — MAGNESIUM: Magnesium: 2.1 mg/dL (ref 1.7–2.4)

## 2024-04-28 MED ORDER — MAGNESIUM SULFATE 2 GM/50ML IV SOLN
2.0000 g | Freq: Once | INTRAVENOUS | Status: AC
  Administered 2024-04-28: 2 g via INTRAVENOUS
  Filled 2024-04-28: qty 50

## 2024-04-28 MED ORDER — METOPROLOL SUCCINATE ER 100 MG PO TB24
100.0000 mg | ORAL_TABLET | Freq: Every day | ORAL | 0 refills | Status: DC
  Filled 2024-04-28: qty 90, 90d supply, fill #0

## 2024-04-28 MED ORDER — POTASSIUM CHLORIDE CRYS ER 20 MEQ PO TBCR
40.0000 meq | EXTENDED_RELEASE_TABLET | Freq: Once | ORAL | Status: AC
  Administered 2024-04-28: 40 meq via ORAL
  Filled 2024-04-28: qty 2

## 2024-04-28 MED ORDER — METOPROLOL SUCCINATE ER 50 MG PO TB24
100.0000 mg | ORAL_TABLET | Freq: Every day | ORAL | Status: DC
  Administered 2024-04-29 – 2024-04-30 (×2): 100 mg via ORAL
  Filled 2024-04-28 (×2): qty 2

## 2024-04-28 MED ORDER — FUROSEMIDE 40 MG PO TABS
40.0000 mg | ORAL_TABLET | Freq: Every day | ORAL | 0 refills | Status: AC | PRN
  Filled 2024-04-28: qty 90, 90d supply, fill #0

## 2024-04-28 NOTE — Progress Notes (Signed)
 Follow up arranged with cardiology on 05/15/24

## 2024-04-28 NOTE — Progress Notes (Signed)
   Patient Name: Donald Morales Date of Encounter: 04/28/2024 [facility] Cardiologist: new ([facility])  Interval Summary  .    No abdominal pain or active overt bleeding denies any difficulty with his breathing.  Vital Signs .    Vitals:   04/27/24 1342 04/27/24 2138 04/28/24 0522 04/28/24 0816  BP: 114/80 110/64 105/69 105/69  Pulse: (!) 48 94 82 82  Resp: 16 18 18    Temp: 98.1 F (36.7 C) 98.8 F (37.1 C) 98.4 F (36.9 C)   TempSrc: Oral Oral Oral   SpO2: 98% 99% 98%   Weight:      Height:        Intake/Output Summary (Last 24 hours) at 04/28/2024 0820 Last data filed at 04/28/2024 0600 Gross per 24 hour  Intake 1440 ml  Output 2150 ml  Net -710 ml      04/26/2024    5:00 AM 04/25/2024    5:00 AM 04/24/2024    5:00 AM  Last 3 Weights  Weight (lbs) 191 lb 9.3 oz 192 lb 10.9 oz 192 lb 10.9 oz  Weight (kg) 86.9 kg 87.4 kg 87.4 kg      Telemetry/ECG    Atrial fibrillation with controlled ventricular response, mostly in the 80s.- Personally Reviewed  Physical Exam .   GEN: No acute distress.  Lying completely supine in bed without any respiratory difficulty. Neck: No JVD Cardiac: Irregular, 1/6 apical holosystolic murmur, no diastolic murmurs, rubs, or gallops.  Respiratory: Clear to auscultation bilaterally. GI: Soft, nontender, non-distended  MS: No edema  Assessment & Plan .     Appears clinically euvolemic.  Asymptomatic from a cardiovascular point of view.  Unaware of palpitations.  Well rate controlled. Hemoglobin stable on oral anticoagulants. Awaiting discharge while necessary home equipment is approved by insurance.  [facility] will sign off.   Medication Recommendations: At discharge, consolidate metoprolol  succinate to 100 mg once daily.  Continue Eliquis  5 mg twice daily.  Furosemide  40 mg once daily as needed based on weight. Other recommendations (labs, testing, etc): Daily weights.  If weight increases 3 pounds in  24 hours or 5 pounds in a week, take furosemide  40 mg daily until weight is back down to less than 195 pounds.   Follow up as an outpatient: Will schedule an appointment in 2-4 weeks after discharge.    For questions or updates, please contact [facility] Please consult [email or web address] for contact info under        Signed, Luana Rumple, MD

## 2024-04-28 NOTE — Plan of Care (Signed)
  Problem: Education: Goal: Knowledge of General Education information will improve Description: Including pain rating scale, medication(s)/side effects and non-pharmacologic comfort measures Outcome: Progressing   Problem: Clinical Measurements: Goal: Ability to maintain clinical measurements within normal limits will improve Outcome: Progressing Goal: Will remain free from infection Outcome: Progressing   Problem: Pain Managment: Goal: General experience of comfort will improve and/or be controlled Outcome: Progressing

## 2024-04-28 NOTE — Progress Notes (Signed)
 OT Cancellation Note  Patient Details Name: Donald Morales MRN: [id number] DOB: [birth year]   Cancelled Treatment:    Reason Eval/Treat Not Completed: Other (comment) Patient reporting no further acute care needs. Patient is independent in ADLs in room.  Plans to have [facility] services in next level of care. OT to sign off at this time.  Wynette Heckler, MS Acute Rehabilitation Department Office# [phone]  04/28/2024, 3:10 PM

## 2024-04-28 NOTE — TOC Progression Note (Signed)
 Transition of Care [facility]) - Progression Note    Patient Details  Name: Donald Morales MRN: [id number] Date of Birth: 07-17-1956  Transition of [facility]) CM/SW Contact  Bari Leys, RN Phone Number: 04/28/2024, 11:00 AM  Clinical Narrative:  NCM called to  [employer] Phone: [phone], [address] [place], confirmed referral received for wound vac  and is pending documentation ( H&P, clinical notes, wound measurements and OP Report) NCM faxed H&P, OP Report, Surgery and Hospitalist progress notes for 5/23 to 5/26 ( wound measurements on surgery progress notes) to [phone] as request, await determination.       Expected Discharge Plan: Home/Self Care Barriers to Discharge: Continued Medical Work up  Expected Discharge Plan and Services         Expected Discharge Date: 04/26/24                         HH Arranged: PT, OT HH Agency: [facility] Date [facility] Agency Contacted: 04/25/24 Time HH Agency Contacted: [phone] Representative spoke with at [facility] Agency: Cindie Sillmon   Social Determinants of Health (SDOH) Interventions SDOH Screenings   Food Insecurity: Patient Unable To Answer (04/13/2024)  Housing: Patient Unable To Answer (04/13/2024)  Transportation Needs: Patient Unable To Answer (04/13/2024)  Utilities: Patient Unable To Answer (04/13/2024)  Social Connections: Patient Unable To Answer (04/13/2024)  Tobacco Use: Low Risk  (04/12/2024)    Readmission Risk Interventions    04/16/2024   11:56 AM  Readmission Risk Prevention Plan  Transportation Screening Complete  PCP or Specialist Appt within 5-7 Days Complete  Home Care Screening Complete  Medication Review (RN CM) Complete

## 2024-04-28 NOTE — Progress Notes (Signed)
 PROGRESS NOTE  Donald Morales RUE:[id number] DOB: 12-Jan-1956   PCP: Rae Bugler, MD  Patient is from: Home.  Independently ambulates at baseline.  DOA: 04/12/2024 LOS: 15  Chief complaints Chief Complaint  Patient presents with   Abdominal Pain   Constipation     Brief Narrative / Interim history: 68 year old M with PMH of a flutter not on AC, HFrEF, mod-sev MR, HTN, hypothyroidism, HLD and BPH presented to ED with progressive abdominal pain for weeks, and admitted with ischemic bowel disease and SMV thrombosis as noted on CT abdomen and pelvis.  Patient underwent diagnostic laparoscopy that was converted to open laparotomy with resection of 40 cm necrotic small bowel on 5/11.  Postop, remained intubated and started on phenylephrine  for hypotension and transferred to ICU.  Returned to the OR on 5/12 for further bowel resection and wound VAC placement.  He was extubated on 5/14.  Eventually, he was extubated on 5/14 and transferred to [facility] on 5/16.  He was also on TPN.   Patient improved gradually.  He tolerated regular diet and came off TPN. Hospital course complicated by A-fib with RVR and acute blood loss anemia/hematemesis.  He was initially treated with IV amiodarone , and transition to Toprol -XL.  Currently, he is rate controlled and hemoglobin is stable.  He is on Eliquis  for anticoagulation.   He is cleared for discharge by cardiology and general surgery.  Waiting on wound VAC.    Subjective: Seen and examined earlier this morning.  No major events overnight of this morning.  No complaints.  Denies chest pain, shortness of breath, dizziness.  Nasal congestion improved.  No epistaxis.  Objective: Vitals:   04/27/24 2138 04/28/24 0522 04/28/24 0816 04/28/24 1337  BP: 110/64 105/69 105/69 114/66  Pulse: 94 82 82 92  Resp: 18 18    Temp: 98.8 F (37.1 C) 98.4 F (36.9 C)  98.4 F (36.9 C)  TempSrc: Oral Oral  Oral  SpO2: 99% 98%  100%  Weight:       Height:        Examination:  GENERAL: No apparent distress.  Nontoxic. HEENT: MMM.  Vision and hearing grossly intact NECK: Supple.  No apparent JVD.  Right IJ CVL RESP:  No IWOB.  Fair aeration bilaterally. CVS: Irregular rhythm.  Normal rate. Heart sounds normal.  ABD/GI/GU: BS+. Abd soft, NTND.  Wound VAC in place. MSK/EXT:  Moves extremities. No apparent deformity. No edema.  SKIN: no apparent skin lesion or wound NEURO: Awake, alert and oriented appropriately.  No apparent focal neuro deficit. PSYCH: Calm. Normal affect.   Consultants:  General surgery Cardiology  Procedures: 5/11-diagnostic laparoscopy, ex lap and resection of 40 cm necrotic small bowel by Dr. Pearlie Bougie  5/12-ex lap with resection of approximately 40 cm of ileum with primary anastomosis and closure of abdomen and placement of subcutaneous wound VAC by Dr. Afton Horse  Microbiology summarized: MRSA PCR screen nonreactive  Assessment and plan: Small bowel ischemia: Presented with progressive abdominal pain.  Her lactic acidosis.  Had CT abdomen and pelvis on 5/10. Unclear why there is no radiology report on CT abdomen and pelvis..  -5/11-diagnostic laparoscopy, ex lap and resection of 40 cm necrotic small bowel by Dr. Pearlie Bougie -5/12-ex lap with resection of approximately 40 cm of ileum with primary anastomosis and closure of abdomen and placement of subcutaneous wound VAC by Dr. Afton Horse -On regular diet.  TPN discontinued. -General Surgery managing.  Wound VAC in place -Needs wound VAC on discharge.  TOC working on this  Septic shock with suspected intra-abdominal infection and bacterial translocation. -Completed course of IV antibiotics with Zosyn  - Remove central line.  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  Acute blood loss anemia and coffee-ground emesis: Transfused 1 unit.  H&H stable after drop. Recent Labs    04/19/24 0555 04/21/24 0242 04/21/24 1224 04/22/24 0247 04/23/24 0524  04/24/24 0207 04/25/24 0500 04/26/24 0221 04/27/24 0503 04/28/24 0443  HGB 9.5* 7.0* 7.0* 8.5* 8.0* 7.7* 7.5* 7.9* 8.4* 8.7*  - Continue monitoring    Acute on chronic systolic CHF: TTE with LVEF of 30 to 35%, RWMA, moderate to severe MR, moderate TR and moderately reduced RVSF.  No cardiopulmonary symptoms at the moment.  Appears euvolemic.  Lasix  discontinued due to orthostatic hypotension. - Cardiology recommends Lasix  40 mg daily PRN if for wt gain > 3 pds in 1 day or 5 lbs in 1 week, Toprol -XL 50 mg twice daily and outpatient follow-up for ischemic evaluation -Toprol -XL 100 mg daily  -Strict intake and output, renal functions and electrolytes -Outpatient follow-up with cardiology for ischemic of duration  Mod-sev MR and moderate TR -Per cardiology   A-fib/flutter with RVR: Was on Cardizem  and amiodarone  before this hospitalization.  He is followed by EP.  Required IV amiodarone  when he went into RVR likely provoked.  Currently rate controlled -Appreciate recs by cardiology-consolidate Toprol -XL to 100 mg daily, continue Eliquis  -Optimize electrolytes  Orthostatic hypotension: Suspected to be due to overdiuresis. - Diuretics held.   Hyperglycemia/prediabetes: A1c 6.3% Recent Labs  Lab 04/24/24 2015 04/25/24 0726 04/25/24 2033 04/26/24 0804 04/27/24 0857  GLUCAP 125* 118* 92 108* 122*  - Monitor CBG with daily labs.  Hx of hypothyroidism: TSH 4.6. -Continue home Synthroid    Acute urinary retention/BPH -Continue Flomax -started here  Epistaxis/nasal congestion: Monitor.  Recommended not blowing his nose aggressively -Saline nose spray  Inadequate oral intake  Body mass index is 28.29 kg/m. Nutrition Problem: Inadequate oral intake Etiology: inability to eat Signs/Symptoms: NPO status Interventions: Ensure Enlive (each supplement provides 350kcal and 20 grams of protein)   DVT prophylaxis:  SCDs Start: 04/13/24 0500 apixaban  (ELIQUIS ) tablet 5 mg  Code Status:  Full code Family Communication: None at bedside Level of care: Med-Surg Status is: Inpatient Remains inpatient appropriate because: Needs for wound VAC on discharge   Final disposition: Home with home health and wound VAC.   35 minutes with more than 50% spent in reviewing records, counseling patient/family and coordinating care.   Sch Meds:  Scheduled Meds:  apixaban   5 mg Oral BID   ascorbic acid  500 mg Oral BID   Chlorhexidine  Gluconate Cloth  6 each Topical Q0600   feeding supplement  237 mL Oral BID BM   ferrous sulfate  325 mg Oral Q breakfast   levothyroxine   50 mcg Oral QAC breakfast   [START ON 04/29/2024] metoprolol  succinate  100 mg Oral Daily   multivitamin with minerals  1 tablet Oral Daily   pantoprazole   40 mg Oral BID AC   pneumococcal 20-valent conjugate vaccine  0.5 mL Intramuscular Tomorrow-1000   polycarbophil  625 mg Oral BID   sodium chloride  flush  10-40 mL Intracatheter Q12H   tamsulosin   0.4 mg Oral Daily   Continuous Infusions: PRN Meds:.acetaminophen , alum & mag hydroxide-simeth, hydrALAZINE , menthol-cetylpyridinium, ondansetron  (ZOFRAN ) IV, sodium chloride , sodium chloride  flush  Antimicrobials: Anti-infectives (From admission, onward)    Start     Dose/Rate Route Frequency Ordered Stop   04/21/24 1200  piperacillin -tazobactam (ZOSYN ) IVPB 3.375 g  Status:  Discontinued        3.375 g 12.5 mL/hr over 240 Minutes Intravenous Every 8 hours 04/21/24 1038 04/23/24 1014   04/14/24 1000  vancomycin  (VANCOREADY) IVPB 1500 mg/300 mL  Status:  Discontinued        1,500 mg 150 mL/hr over 120 Minutes Intravenous Every 24 hours 04/13/24 0731 04/15/24 1416   04/14/24 0700  vancomycin  (VANCOREADY) IVPB 1750 mg/350 mL  Status:  Discontinued        1,750 mg 175 mL/hr over 120 Minutes Intravenous Every 24 hours 04/13/24 0547 04/13/24 0731   04/13/24 0615  vancomycin  (VANCOREADY) IVPB 1750 mg/350 mL        1,750 mg 175 mL/hr over 120 Minutes Intravenous  Once  04/13/24 0525 04/13/24 0811   04/13/24 0400  piperacillin -tazobactam (ZOSYN ) IVPB 3.375 g  Status:  Discontinued        3.375 g 100 mL/hr over 30 Minutes Intravenous Every 6 hours 04/13/24 0240 04/13/24 0244   04/13/24 0400  piperacillin -tazobactam (ZOSYN ) IVPB 3.375 g  Status:  Discontinued        3.375 g 12.5 mL/hr over 240 Minutes Intravenous Every 8 hours 04/13/24 0245 04/18/24 0936   04/12/24 2245  piperacillin -tazobactam (ZOSYN ) IVPB 3.375 g        3.375 g 100 mL/hr over 30 Minutes Intravenous  Once 04/12/24 2237 04/12/24 2333        I have personally reviewed the following labs and images: CBC: Recent Labs  Lab 04/24/24 0207 04/25/24 0500 04/26/24 0221 04/27/24 0503 04/28/24 0443  WBC 12.5* 8.6 7.4 5.5 5.5  HGB 7.7* 7.5* 7.9* 8.4* 8.7*  HCT 25.9* 24.6* 25.9* 27.3* 28.3*  MCV 82.2 82.6 82.7 82.5 82.5  PLT 146* 182 212 257 301   BMP &GFR Recent Labs  Lab 04/22/24 0247 04/23/24 0524 04/24/24 0207 04/25/24 0500 04/26/24 0221 04/27/24 0503 04/28/24 0441 04/28/24 0443  NA 140   < > 131* 132* 132* 132*  --  132*  K 4.1   < > 3.8 3.8 3.7 3.6  --  3.7  CL 110   < > 100 103 105 104  --  104  CO2 21*   < > 24 23 20* 20*  --  20*  GLUCOSE 128*   < > 121* 124* 96 118*  --  112*  BUN 33*   < > 27* 24* 23 21  --  17  CREATININE 0.70   < > 0.86 0.80 0.90 1.07  --  1.13  CALCIUM 7.8*   < > 7.3* 7.3* 7.3* 7.7*  --  7.7*  MG 2.0  --  1.9 2.2  --  2.2 2.1  --   PHOS 3.4  --  3.5  --   --   --   --   --    < > = values in this interval not displayed.   Estimated Creatinine Clearance (by C-G formula based on SCr of 1.13 mg/dL) Male: 95.6 mL/min Male: 71.2 mL/min Liver & Pancreas: Recent Labs  Lab 04/22/24 0247 04/23/24 0524 04/24/24 0207  AST 37 32 31  ALT 52* 46* 39  ALKPHOS 65 67 79  BILITOT 2.8* 1.7* 1.5*  PROT 5.3* 5.4* 5.5*  ALBUMIN  2.2* 2.2* 2.1*   No results for input(s): "LIPASE", "AMYLASE" in the last 168 hours. No results for input(s): "AMMONIA" in  the last 168 hours. Diabetic: No results for input(s): "HGBA1C" in the last 72 hours. Recent Labs  Lab 04/24/24 2015 04/25/24 0726 04/25/24 2033 04/26/24 0804 04/27/24 0857  GLUCAP 125* 118* 92 108* 122*   Cardiac Enzymes: No results for input(s): "CKTOTAL", "CKMB", "CKMBINDEX", "TROPONINI" in the last 168 hours. No results for input(s): "PROBNP" in the last 8760 hours. Coagulation Profile: No results for input(s): "INR", "PROTIME" in the last 168 hours. Thyroid Function Tests: No results for input(s): "TSH", "T4TOTAL", "FREET4", "T3FREE", "THYROIDAB" in the last 72 hours. Lipid Profile: No results for input(s): "CHOL", "HDL", "LDLCALC", "TRIG", "CHOLHDL", "LDLDIRECT" in the last 72 hours. Anemia Panel: No results for input(s): "VITAMINB12", "FOLATE", "FERRITIN", "TIBC", "IRON", "RETICCTPCT" in the last 72 hours. Urine analysis:    Component Value Date/Time   COLORURINE YELLOW 04/13/2024 0612   APPEARANCEUR HAZY (A) 04/13/2024 0612   LABSPEC >1.046 (H) 04/13/2024 0612   PHURINE 5.0 04/13/2024 0612   GLUCOSEU NEGATIVE 04/13/2024 0612   HGBUR MODERATE (A) 04/13/2024 0612   BILIRUBINUR NEGATIVE 04/13/2024 0612   KETONESUR NEGATIVE 04/13/2024 0612   PROTEINUR 100 (A) 04/13/2024 0612   NITRITE NEGATIVE 04/13/2024 0612   LEUKOCYTESUR NEGATIVE 04/13/2024 0612   Sepsis Labs: Invalid input(s): "PROCALCITONIN", "LACTICIDVEN"  Microbiology: No results found for this or any previous visit (from the past 240 hours).  Radiology Studies: No results found.    Arlene Brickel T. Jazmyne Beauchesne [facility]  If 7PM-7AM, please contact night-coverage [email or web address] 04/28/2024, 1:46 PM

## 2024-04-29 ENCOUNTER — Other Ambulatory Visit (HOSPITAL_COMMUNITY): Payer: Self-pay

## 2024-04-29 DIAGNOSIS — K559 Vascular disorder of intestine, unspecified: Secondary | ICD-10-CM | POA: Diagnosis not present

## 2024-04-29 LAB — BASIC METABOLIC PANEL WITH GFR
Anion gap: 5 (ref 5–15)
BUN: 14 mg/dL (ref 8–23)
CO2: 20 mmol/L — ABNORMAL LOW (ref 22–32)
Calcium: 7.5 mg/dL — ABNORMAL LOW (ref 8.9–10.3)
Chloride: 105 mmol/L (ref 98–111)
Creatinine, Ser: 1.03 mg/dL (ref 0.61–1.24)
GFR, Estimated: >60 mL/min (ref >60)
Glucose, Bld: 115 mg/dL — ABNORMAL HIGH (ref 70–99)
Potassium: 3.8 mmol/L (ref 3.5–5.1)
Sodium: 130 mmol/L — ABNORMAL LOW (ref 135–145)

## 2024-04-29 LAB — CBC
HCT: 29.5 % — ABNORMAL LOW (ref 39.0–52.0)
Hemoglobin: 8.8 g/dL — ABNORMAL LOW (ref 13.0–17.0)
MCH: 25.1 pg — ABNORMAL LOW (ref 26.0–34.0)
MCHC: 29.8 g/dL — ABNORMAL LOW (ref 30.0–36.0)
MCV: 84 fL (ref 80.0–100.0)
Platelets: 340 10*3/uL (ref 150–400)
RBC: 3.51 MIL/uL — ABNORMAL LOW (ref 4.22–5.81)
RDW: 21.6 % — ABNORMAL HIGH (ref 11.5–15.5)
WBC: 5.5 10*3/uL (ref 4.0–10.5)
nRBC: 0 % (ref 0.0–0.2)

## 2024-04-29 MED ORDER — POLYETHYLENE GLYCOL 3350 17 G PO PACK
17.0000 g | PACK | Freq: Every day | ORAL | Status: DC | PRN
  Administered 2024-04-29: 17 g via ORAL
  Filled 2024-04-29: qty 1

## 2024-04-29 MED ORDER — SODIUM CHLORIDE 0.9 % IV SOLN: INTRAVENOUS | Status: DC

## 2024-04-29 MED ORDER — FINASTERIDE 5 MG PO TABS: 5.0000 mg | ORAL_TABLET | Freq: Every day | ORAL | Status: DC

## 2024-04-29 MED ORDER — FINASTERIDE 5 MG PO TABS
5.0000 mg | ORAL_TABLET | Freq: Every day | ORAL | 2 refills | Status: AC
  Filled 2024-04-29: qty 30, 30d supply, fill #0

## 2024-04-29 MED ORDER — FINASTERIDE 5 MG PO TABS
5.0000 mg | ORAL_TABLET | Freq: Every day | ORAL | Status: DC
  Administered 2024-04-30: 5 mg via ORAL
  Filled 2024-04-29: qty 1

## 2024-04-29 NOTE — Discharge Summary (Signed)
 Donald Morales PROGRESS NOTE  Donald Morales  DOB: 30-May-1956  PCP: Rae Bugler, MD WGN:[id number]  DOA: 04/12/2024  LOS: 16 days  Hospital Day: 18  Brief narrative: Donald Morales is a 68 y.o. adult with PMH significant for HTN, HLD, atrial flutter not on anticoagulation, hypothyroidism, BPH. 5/11, patient presented to the ED with progressive abdominal pain for few weeks worse on last few days.  Last BM was 5 days prior to presentation.  CT abdomen showed bowel wall thickening and also raised concern for SMV thrombosis.  Labs showed WC count of 12.1 and lactic acid elevated 2.8 and trended up to 4.9  Patient was seen by general surgery and taken to the OR emergently. Underwent diagnostic laparoscopy, converted to open.  Underwent resection of 40 cm of necrotic small bowel.  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 Left in discontinuity to return to OR in 24-48hrs.  Patient was admitted to ICU postop.  Required 20 phenylephrine  drip. 5/12, taken to OR for further bowel resection and closure of abdomen.  Postop, patient had Afib with RVR required amiodarone .  Eventually weaned off pressors, started on TPN,anticoagulation with IV heparin  drip.  5/14, extubated  5/16, transferred out to [facility]  In the subsequent several days, patient's postop ileus improved. Currently has wound VAC in place is planned to continue at discharge.  Subjective: Patient was seen and examined this morning.   Propped up in bed. Was concerned because has not had a bowel movement in 2 days.  MiraLAX was given Later this morning, he was orthostatic.  IV fluid was started  Assessment and plan: Mesenteric Vein Thrombosis --> Small bowel ischemia --> Bacterial translocation --> Septic shock S/p surgical resection of bowel on 5/11, and further resection and closure on 5/12 Postop ileus resolved.  Tolerating oral intake Has wound VAC in place -to continue wound VAC at discharge Completed a course of Zosyn . Pain management  with as needed Tylenol    A-fib with RVR Has history of A-fib and was seen by EP in the past but did not follow-up. With RVR this hospitalization, patient was started on amiodarone  drip, digoxin  and heparin  drip. Subsequently switched to oral metoprolol . Currently normal sinus rhythm Started on anticoagulation with Eliquis .  Acute blood loss anemia Coffee-ground emesis Intraoperative blood loss as well as probably anastomotic blood loss in the postoperative course. Received 2 units of PRBC transfusion so far. Melanotic stool stopped. Hemoglobin relatively stable for several days On Protonix  twice daily Anticoagulation as above Recent Labs    04/25/24 0500 04/26/24 0221 04/27/24 0503 04/28/24 0443 04/29/24 0416  HGB 7.5* 7.9* 8.4* 8.7* 8.8*  MCV 82.6 82.7 82.5 82.5 84.0   Hyponatremia Likely from low intake.  Continue to monitor. Recent Labs  Lab 04/23/24 0524 04/24/24 0207 04/25/24 0500 04/26/24 0221 04/27/24 0503 04/28/24 0443 04/29/24 0416  NA 134* 131* 132* 132* 132* 132* 130*   Acute worsening of chronic systolic CHF Moderate to severe MR  EF in 2023 with 35 to 40%.  Echo 5/11 showed EF of 30 to 35% with regional WMA and moderately reduced RV function, moderate to severe MR, moderate TR. Cardiology consult was obtained. Currently on metoprolol  succinate 100 mg daily Was diuresed with IV Lasix  but was held because of orthostatic hypotension  Orthostatic hypotension Likely due to overdiuresis as well as the use of Flomax .  Blood pressure drop to 80s on standing up today.  Flomax  was stopped.  IV fluids started. Patient however is very eager to go home  today.  Hx of hypothyroidism Continue oral Synthroid  today.  Impaired mobility Seen by PT. Home with PT recommended   Goals of care   Code Status: Full Code     DVT prophylaxis:  SCDs Start: 04/13/24 0500 apixaban  (ELIQUIS ) tablet 5 mg   Antimicrobials: Completed course of Zosyn  Fluid: NS at 75  mL/h Consultants: General Surgery, cardiology  Family Communication: None at bedside  Level of care: Med-Surg .    Patient is from: Home Needs to continue in-hospital care: Hold discharge because of orthostatic hypotension Anticipated d/c to: Pending clinical course.  Hopefully home with home health tomorrow  Diet:  Diet Order             Diet - low sodium heart healthy           Diet Heart Room service appropriate? Yes; Fluid consistency: Thin  Diet effective now                   Scheduled Meds:  apixaban   5 mg Oral BID   ascorbic acid  500 mg Oral BID   feeding supplement  237 mL Oral BID BM   ferrous sulfate  325 mg Oral Q breakfast   [START ON 04/30/2024] finasteride  5 mg Oral Daily   levothyroxine   50 mcg Oral QAC breakfast   metoprolol  succinate  100 mg Oral Daily   multivitamin with minerals  1 tablet Oral Daily   pantoprazole   40 mg Oral BID AC   pneumococcal 20-valent conjugate vaccine  0.5 mL Intramuscular Tomorrow-1000   polycarbophil  625 mg Oral BID   sodium chloride  flush  10-40 mL Intracatheter Q12H    PRN meds: acetaminophen , alum & mag hydroxide-simeth, hydrALAZINE , menthol-cetylpyridinium, ondansetron  (ZOFRAN ) IV, polyethylene glycol, sodium chloride , sodium chloride  flush   Infusions:   sodium chloride       Antimicrobials: Anti-infectives (From admission, onward)    Start     Dose/Rate Route Frequency Ordered Stop   04/21/24 1200  piperacillin -tazobactam (ZOSYN ) IVPB 3.375 g  Status:  Discontinued        3.375 g 12.5 mL/hr over 240 Minutes Intravenous Every 8 hours 04/21/24 1038 04/23/24 1014   04/14/24 1000  vancomycin  (VANCOREADY) IVPB 1500 mg/300 mL  Status:  Discontinued        1,500 mg 150 mL/hr over 120 Minutes Intravenous Every 24 hours 04/13/24 0731 04/15/24 1416   04/14/24 0700  vancomycin  (VANCOREADY) IVPB 1750 mg/350 mL  Status:  Discontinued        1,750 mg 175 mL/hr over 120 Minutes Intravenous Every 24 hours 04/13/24 0547  04/13/24 0731   04/13/24 0615  vancomycin  (VANCOREADY) IVPB 1750 mg/350 mL        1,750 mg 175 mL/hr over 120 Minutes Intravenous  Once 04/13/24 0525 04/13/24 0811   04/13/24 0400  piperacillin -tazobactam (ZOSYN ) IVPB 3.375 g  Status:  Discontinued        3.375 g 100 mL/hr over 30 Minutes Intravenous Every 6 hours 04/13/24 0240 04/13/24 0244   04/13/24 0400  piperacillin -tazobactam (ZOSYN ) IVPB 3.375 g  Status:  Discontinued        3.375 g 12.5 mL/hr over 240 Minutes Intravenous Every 8 hours 04/13/24 0245 04/18/24 0936   04/12/24 2245  piperacillin -tazobactam (ZOSYN ) IVPB 3.375 g        3.375 g 100 mL/hr over 30 Minutes Intravenous  Once 04/12/24 2237 04/12/24 2333       Objective: Vitals:   04/29/24 1053 04/29/24 1309  BP: 107/82  117/73  Pulse: 81 72  Resp:  17  Temp:  98.1 F (36.7 C)  SpO2: 100% 100%    Intake/Output Summary (Last 24 hours) at 04/29/2024 1548 Last data filed at 04/29/2024 1317 Gross per 24 hour  Intake 1230 ml  Output 1050 ml  Net 180 ml   Filed Weights   04/24/24 0500 04/25/24 0500 04/26/24 0500  Weight: 87.4 kg 87.4 kg 86.9 kg   Weight change:  Body mass index is 28.29 kg/m.   Physical Exam: General exam: Pleasant, elderly Caucasian male.  Not in pain Skin: No rashes, lesions or ulcers. HEENT: Atraumatic, normocephalic, no obvious bleeding Lungs: Clear to auscultation bilaterally CVS: S1, S2, faint pansystolic murmur GI/Abd: Soft, appropriate postop tenderness, nondistended, bowel sound present.  Abdominal Wound VAC in place.,   CNS: Alert, awake, oriented x 3 Psychiatry: Mood appropriate Extremities: No pedal edema, no calf tenderness  Data Review: I have personally reviewed the laboratory data and studies available.  F/u labs  Unresulted Labs (From admission, onward)    None      Signed, Donald Macleod, MD [facility] 04/29/2024

## 2024-04-29 NOTE — Progress Notes (Signed)
 Progress Note  15 Days Post-Op  Subjective: No new issues.  Tolerating PO. Mobilizing in his room independently  Objective: Vital signs in last 24 hours: Temp:  [97.8 F (36.6 C)-98.4 F (36.9 C)] 97.8 F (36.6 C) (05/27 0607) Pulse Rate:  [86-92] 86 (05/27 0812) Resp:  [18-19] 18 (05/27 0607) BP: (106-114)/(66-74) 106/67 (05/27 0812) SpO2:  [100 %] 100 % (05/27 0607) Last BM Date : 04/27/24  Intake/Output from previous day: 05/26 0701 - 05/27 0700 In: 1110 [P.O.:1110] Out: 1250 [Urine:1250] Intake/Output this shift: Total I/O In: 240 [P.O.:240] Out: -   PE: General: pleasant, WD, WN male who is laying in bed in NAD Heart: irregularly irregular Lungs: Respiratory effort nonlabored Abd: soft, nontender, midline wound with VAC present, Psych: A&Ox3 with an appropriate affect.    Lab Results:  Recent Labs    04/28/24 0443 04/29/24 0416  WBC 5.5 5.5  HGB 8.7* 8.8*  HCT 28.3* 29.5*  PLT 301 340   BMET Recent Labs    04/28/24 0443 04/29/24 0416  NA 132* 130*  K 3.7 3.8  CL 104 105  CO2 20* 20*  GLUCOSE 112* 115*  BUN 17 14  CREATININE 1.13 1.03  CALCIUM 7.7* 7.5*   PT/INR No results for input(s): "LABPROT", "INR" in the last 72 hours. CMP     Component Value Date/Time   NA 130 (L) 04/29/2024 0416   K 3.8 04/29/2024 0416   CL 105 04/29/2024 0416   CO2 20 (L) 04/29/2024 0416   GLUCOSE 115 (H) 04/29/2024 0416   BUN 14 04/29/2024 0416   CREATININE 1.03 04/29/2024 0416   CALCIUM 7.5 (L) 04/29/2024 0416   PROT 5.5 (L) 04/24/2024 0207   ALBUMIN  2.1 (L) 04/24/2024 0207   AST 31 04/24/2024 0207   ALT 39 04/24/2024 0207   ALKPHOS 79 04/24/2024 0207   BILITOT 1.5 (H) 04/24/2024 0207   GFRNONAA >60 04/29/2024 0416   Lipase     Component Value Date/Time   LIPASE 52 (H) 04/12/2024 2049       Studies/Results: No results found.   Anti-infectives: Anti-infectives (From admission, onward)    Start     Dose/Rate Route Frequency Ordered Stop    04/21/24 1200  piperacillin -tazobactam (ZOSYN ) IVPB 3.375 g  Status:  Discontinued        3.375 g 12.5 mL/hr over 240 Minutes Intravenous Every 8 hours 04/21/24 1038 04/23/24 1014   04/14/24 1000  vancomycin  (VANCOREADY) IVPB 1500 mg/300 mL  Status:  Discontinued        1,500 mg 150 mL/hr over 120 Minutes Intravenous Every 24 hours 04/13/24 0731 04/15/24 1416   04/14/24 0700  vancomycin  (VANCOREADY) IVPB 1750 mg/350 mL  Status:  Discontinued        1,750 mg 175 mL/hr over 120 Minutes Intravenous Every 24 hours 04/13/24 0547 04/13/24 0731   04/13/24 0615  vancomycin  (VANCOREADY) IVPB 1750 mg/350 mL        1,750 mg 175 mL/hr over 120 Minutes Intravenous  Once 04/13/24 0525 04/13/24 0811   04/13/24 0400  piperacillin -tazobactam (ZOSYN ) IVPB 3.375 g  Status:  Discontinued        3.375 g 100 mL/hr over 30 Minutes Intravenous Every 6 hours 04/13/24 0240 04/13/24 0244   04/13/24 0400  piperacillin -tazobactam (ZOSYN ) IVPB 3.375 g  Status:  Discontinued        3.375 g 12.5 mL/hr over 240 Minutes Intravenous Every 8 hours 04/13/24 0245 04/18/24 0936   04/12/24 2245  piperacillin -tazobactam (ZOSYN ) IVPB  3.375 g        3.375 g 100 mL/hr over 30 Minutes Intravenous  Once 04/12/24 2237 04/12/24 2333        Assessment/Plan SMV thrombosis and Small bowel ischemia  POD16 ex-lap, small bowel resection (40 cm), open abdominal VAC Dr. Ramiro Burly POD15 ex-lap, resection (40 cm ileum) with primary anastomosis and closure of abdomen Dr. Afton Horse - tolerating  HH diet, TPN off - hgb stable - mobilize as tolerated  - VAC changes M-R, wrote for home [facility] and [facility] RN. Per report measurements at last change ~ 12 cm x 2 cm x 2.5 cm.    FEN: HH diet VTE: Eliquis  ID: [email or web address]  5/10>5/16  - per [facility] -  HFrEF HTN Thyroid disease A. Fib   Remains stable for discharge surgical standpoint when cleared by primary team.     LOS: 16 days     Charlott Converse, [facility] Surgery 04/29/2024, 10:39  AM Please see Amion for pager number during day hours 7:00am-4:30pm

## 2024-04-29 NOTE — Progress Notes (Signed)
 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  On 5/11 he he required resection of necrotic bowel and was left in discontinuity.  On 5/12 he returned to the OR with a small residual area of necrotic tissue removed and was reconnected.  Wound VAC in place.  Transferred back to the ICU for mechanical ventilation.   Pt with a history of A-fib not on anticoagulation    PT Comments  Pt agreeable to PT, mobility limited by orthostatic BPs/dizziness. Unable to attempt stair training.   04/29/24 1500  Orthostatic Lying   BP- Lying 120/73 (map 86)  Pulse- Lying 85  Orthostatic Sitting  BP- Sitting 105/65 (map 76)  Pulse- Sitting 67  Orthostatic Standing at 0 minutes  BP- Standing at 0 minutes (!) 71/65 (map 62)  Pulse- Standing at 0 minutes 82   Pt educated on exercises to elevate BP, discussed precautions at home, use of rollator for safety. Pt is frustrated with extended time in hospital. Friend present on PT departure. RN updated.  Will continue to follow in acute setting.       If plan is discharge home, recommend the following: A little help with walking and/or transfers;A little help with bathing/dressing/bathroom;Assistance with cooking/housework;Assist for transportation;Help with stairs or ramp for entrance   Can travel by private vehicle     Yes  Equipment Recommendations  Rollator (4 wheels) (pt may borrow Rollator)    Recommendations for Other Services       Precautions / Restrictions Precautions Precautions: Other (comment) Recall of Precautions/Restrictions: Intact Precaution/Restrictions Comments: home VAC-abd incision Restrictions Weight Bearing Restrictions Per Provider Order: No     Mobility  Bed Mobility Overal bed mobility: Modified Independent                   Transfers Overall transfer level: Modified independent Equipment used: Rollator (4 wheels) Transfers: Sit to/from Stand Sit to Stand: Modified independent (Device/Increase time), Supervision           General transfer comment: supervision for safety only d/t orthostatic; pt "lightheaded" in standing x 1 minute. returned to seated position with symptom improvement    Ambulation/Gait Ambulation/Gait assistance: Supervision Gait Distance (Feet): 10 Feet Assistive device: Rollator (4 wheels) Gait Pattern/deviations: Step-through pattern Gait velocity: decr     General Gait Details: amb short distance in room, limited d/t orthostasis   Stairs             Wheelchair Mobility     Tilt Bed    Modified Rankin (Stroke Patients Only)       Balance Overall balance assessment: Needs assistance Sitting-balance support: Feet supported Sitting balance-Leahy Scale: Normal     Standing balance support: No upper extremity supported Standing balance-Leahy Scale: Fair Standing balance comment: Fair+. NT to mod challenges - pt is able to amb short distance without UE support                            Communication Communication Communication: No apparent difficulties  Cognition Arousal: Alert Behavior During Therapy: WFL for tasks assessed/performed   PT - Cognitive impairments: No apparent impairments                         Following commands: Intact  Cueing Cueing Techniques: Verbal cues  Exercises General Exercises - Lower Extremity Ankle Circles/Pumps: AROM, Both, 10 reps, Supine Heel Raises: AROM, Both, 5 reps, Standing Other Exercises Other Exercises: leg crossing to elevate BP    General Comments        Pertinent Vitals/Pain Pain Assessment Pain Assessment: No/denies pain    Home Living                          Prior Function            PT Goals (current goals can now be found in the care plan section)  Acute Rehab PT Goals Patient Stated Goal: likes to mow the lawn, get home to 2 dogs [place] PT Goal Formulation: With patient Time For Goal Achievement: 05/07/24 Potential to Achieve Goals: Good Progress towards PT goals: Progressing toward goals    Frequency    Min 3X/week      PT Plan      Co-evaluation              AM-PAC PT "6 Clicks" Mobility   Outcome Measure  Help needed turning from your back to your side while in a flat bed without using bedrails?: None Help needed moving from lying on your back to sitting on the side of a flat bed without using bedrails?: None Help needed moving to and from a bed to a chair (including a wheelchair)?: A Little Help needed standing up from a chair using your arms (e.g., wheelchair or bedside chair)?: None Help needed to walk in hospital room?: A Little Help needed climbing 3-5 steps with a railing? : A Little 6 Click Score: 21    End of Session Equipment Utilized During Treatment: Gait belt Activity Tolerance: Patient tolerated treatment well Patient left: in bed;with call bell/phone within reach;with bed alarm set;with family/visitor present Nurse Communication: Mobility status;Other (comment) (BPs) PT Visit Diagnosis: Difficulty in walking, not elsewhere classified (R26.2);Other abnormalities of gait and mobility (R26.89)     Time: 1610-9604 PT Time Calculation (min) (ACUTE ONLY): 23 min  Charges:    $Gait Training: 8-22 mins $Therapeutic Activity: 8-22 mins PT General Charges $$ ACUTE PT VISIT: 1 Visit                     Belynda Pagaduan, PT  Acute Rehab Dept [facility]) [phone]  04/29/2024    [facility] 04/29/2024, 4:06 PM

## 2024-04-29 NOTE — Progress Notes (Signed)
 Discharge medication delivered to patient at bedside [facility]

## 2024-04-30 NOTE — TOC Progression Note (Signed)
 Transition of Care [facility]) - Progression Note    Patient Details  Name: Donald Morales MRN: [id number] Date of Birth: Apr 24, 1956  [facility]) CM/SW Contact  Bari Leys, RN Phone Number: 04/30/2024, 10:15 AM  Clinical Narrative: PT recommendation for Rollator, order entered by attending. [employer], rep-Jermaine, accepted and will deliver to bedside, added to AVS.        Expected Discharge Plan: Home/Self Care Barriers to Discharge: Continued Medical Work up  Expected Discharge Plan and Services         Expected Discharge Date: 04/26/24                         HH Arranged: PT, OT HH Agency: [facility] Date [facility] Agency Contacted: 04/25/24 Time HH Agency Contacted: 1653 Representative spoke with at [facility] Agency: Cindie Sillmon   Social Determinants of Health (SDOH) Interventions SDOH Screenings   Food Insecurity: Patient Unable To Answer (04/13/2024)  Housing: Patient Unable To Answer (04/13/2024)  Transportation Needs: Patient Unable To Answer (04/13/2024)  Utilities: Patient Unable To Answer (04/13/2024)  Social Connections: Patient Unable To Answer (04/13/2024)  Tobacco Use: Low Risk  (04/12/2024)    Readmission Risk Interventions    04/16/2024   11:56 AM  Readmission Risk Prevention Plan  Transportation Screening Complete  PCP or Specialist Appt within 5-7 Days Complete  Home Care Screening Complete  Medication Review (RN CM) Complete

## 2024-04-30 NOTE — Care Management Important Message (Signed)
 Important Message  Patient Details  Name: Donald Morales MRN: [id number] Date of Birth: 02-18-1956   Important Message Given:  Yes - Medicare IM (copy mailed to address on file)     Neila Bally 04/30/2024, 11:50 AM

## 2024-04-30 NOTE — Progress Notes (Signed)
 AVS reviewed w/ pt whoverbalized an understanding. Home wound vac attached by floor nurse yesterday when pt was supposed to go home. PIV removed as noted. Pt dressing for d/c to home

## 2024-04-30 NOTE — Discharge Summary (Signed)
 Physician Discharge Summary  Donald Morales:[id number] DOB: 12-12-1955 DOA: 04/12/2024  PCP: Rae Bugler, MD  Admit date: 04/12/2024 Discharge date: 04/30/2024  Admitted From: home Discharge disposition: home with HH, wound vac, rollator  Recommendations at discharge:  You have been started on Eliquis  which is a blood thinner.  Please watch out for any obvious bleeding or black stool or excessive bruisability. You have been started on Proscar for enlarged prostate.  Follow-up with urology.  Outpatient referral given. Follow-up with general surgery for wound VAC management Continue MiraLAX as needed to avoid constipation Use Lasix  as needed for pedal edema, shortness of breath.  Follow-up with cardiology as an outpatient   Brief narrative: Donald Morales is a 68 y.o. adult with PMH significant for HTN, HLD, atrial flutter not on anticoagulation, hypothyroidism, BPH. 5/11, patient presented to the ED with progressive abdominal pain for few weeks worse on last few days.  Last BM was 5 days prior to presentation.  CT abdomen showed bowel wall thickening and also raised concern for SMV thrombosis.  Labs showed WC count of 12.1 and lactic acid elevated 2.8 and trended up to 4.9  Patient was seen by general surgery and taken to the OR emergently. Underwent diagnostic laparoscopy, converted to open.  Underwent resection of 40 cm of necrotic small bowel.  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 Left in discontinuity to return to OR in 24-48hrs.  Patient was admitted to ICU postop.  Required 20 phenylephrine  drip. 5/12, taken to OR for further bowel resection and closure of abdomen.  Postop, patient had Afib with RVR required amiodarone .  Eventually weaned off pressors, started on TPN,anticoagulation with IV heparin  drip.  5/14, extubated  5/16, transferred out to [facility]  In the subsequent several days, patient's postop ileus improved. Currently has wound VAC in place is planned to  continue at discharge.  Subjective: Patient was seen and examined this morning.   Propped up in bed. Not in distress.  Not orthostatic today. Feels better after a huge bowel movement yesterday.  Hospital course: Mesenteric Vein Thrombosis --> Small bowel ischemia --> Bacterial translocation --> Septic shock S/p surgical resection of bowel on 5/11, and further resection and closure on 5/12 Postop ileus resolved.  Tolerating oral intake Has wound VAC in place -to continue wound VAC at discharge to follow-up with surgery as an outpatient Completed a course of Zosyn . Pain management with as needed Tylenol  To continue as needed MiraLAX at home and avoid constipation   A-fib with RVR Has history of A-fib and was seen by EP in the past but did not follow-up. With RVR this hospitalization, patient needed amiodarone  drip, digoxin  Subsequently switched to oral metoprolol . Currently normal sinus rhythm with metoprolol .  To continue the same at home. Started on anticoagulation with Eliquis .  To continue for lifelong  Acute blood loss anemia Coffee-ground emesis Intraoperative blood loss as well as probably anastomotic blood loss in the postoperative course. Received 2 units of PRBC transfusion during this specialization Melanotic stool stopped. Hemoglobin relatively stable for several days On Protonix  twice daily Anticoagulation as above Recent Labs    04/25/24 0500 04/26/24 0221 04/27/24 0503 04/28/24 0443 04/29/24 0416  HGB 7.5* 7.9* 8.4* 8.7* 8.8*  MCV 82.6 82.7 82.5 82.5 84.0   Hyponatremia Likely from low intake.  Continue to monitor. Recent Labs  Lab 04/24/24 0207 04/25/24 0500 04/26/24 0221 04/27/24 0503 04/28/24 0443 04/29/24 0416  NA 131* 132* 132* 132* 132* 130*   Acute worsening of  chronic systolic CHF Moderate to severe MR  EF in 2023 with 35 to 40%.  Echo 5/11 showed EF of 30 to 35% with regional WMA and moderately reduced RV function, moderate to severe MR,  moderate TR. Cardiology consult was obtained. Currently on metoprolol  succinate 100 mg daily Was diuresed with IV Lasix  but was held because of orthostatic hypotension Given prescription for as needed Lasix  at home to take for edema, shortness of breath  Orthostatic hypotension Likely due to overdiuresis as well as the use of Flomax .  Yesterday, blood pressure drop to 80s on standing up today.  Flomax  was stopped.  Given IV hydration overnight.   Orthostatic vital signs negative this morning.  Hx of hypothyroidism Continue oral Synthroid   Impaired mobility Seen by PT. Home with PT recommended   Goals of care   Code Status: Full Code   Diet:  Diet Order             Diet - low sodium heart healthy           Diet Heart Room service appropriate? Yes; Fluid consistency: Thin  Diet effective now                   Nutritional status:  Body mass index is 28.29 kg/m.  Nutrition Problem: Inadequate oral intake Etiology: inability to eat Signs/Symptoms: NPO status  Wounds:  - Incision (Closed) 04/13/24 Abdomen Other (Comment) (Active)  Date First Assessed/Time First Assessed: 04/13/24 0511   Location: Abdomen  Location Orientation: Other (Comment)    Assessments 04/13/2024  5:30 AM 04/30/2024 10:09 AM  Dressing Type Negative pressure wound therapy Negative pressure wound therapy  Dressing Clean, Dry, Intact --  Margins Unattached edges (unapproximated) --  Drainage Description Sanguineous --  Treatment Negative pressure wound therapy --     No associated orders.     Incision - 2 Ports Abdomen 1: Umbilicus 2: Left;Lower;Mid (Active)  Placement Date: 04/13/24   Location of Ports: Abdomen  Port: 1:  Location Orientation: Umbilicus  Port: 2:  Location Orientation: Left;Lower;Mid    Assessments 04/13/2024  5:30 AM 04/30/2024 10:09 AM  Port 1 Site Assessment Other (Comment) --  Port 1 Margins -- Attached edges (approximated)  Port 1 Dressing Type -- Liquid skin adhesive   Port 1 Dressing Status -- Clean, Dry, Intact  Port 2 Site Assessment -- Clean;Dry  Port 2 Margins -- Attached edges (approximated)  Port 2 Drainage Amount -- None  Port 2 Dressing Type -- Liquid skin adhesive  Port 2 Dressing Status -- Clean, Dry, Intact     No associated orders.     Negative Pressure Wound Therapy Abdomen Mid (Active)  Placement Date/Time: 04/13/24 0700   Wound Type: Incision (Closed wound)  Location: Abdomen  Location Orientation: Mid    Assessments 04/13/2024  7:00 AM 04/30/2024 10:09 AM  Cycle -- Continuous  Target Pressure (mmHg) -- 125  Output (mL) 400 mL --     No associated orders.     Incision (Closed) 04/14/24 Abdomen Other (Comment) (Active)  Date First Assessed/Time First Assessed: 04/14/24 1204   Location: Abdomen  Location Orientation: Other (Comment)    Assessments 04/23/2024  8:00 PM 04/29/2024  8:42 PM  Dressing Type Negative pressure wound therapy Negative pressure wound therapy  Dressing Clean, Dry, Intact Clean, Dry, Intact  Site / Wound Assessment Dressing in place / Unable to assess Clean;Dry  Drainage Amount Scant --     No associated orders.    Discharge Exam:  Vitals:   04/29/24 1053 04/29/24 1309 04/29/24 2157 04/30/24 0626  BP: 107/82 117/73 91/63 119/75  Pulse: 81 72 82 89  Resp:  17 18 18   Temp:  98.1 F (36.7 C) 98.3 F (36.8 C) 98.4 F (36.9 C)  TempSrc:  Oral Oral Oral  SpO2: 100% 100% 100% 98%  Weight:      Height:        Body mass index is 28.29 kg/m.  General exam: Pleasant, elderly Caucasian male.  Not in pain.  Not in distress Skin: No rashes, lesions or ulcers. HEENT: Atraumatic, normocephalic, no obvious bleeding Lungs: Clear to auscultation bilaterally CVS: S1, S2, faint pansystolic murmur GI/Abd: Soft, nontender, nondistended, bowel sound present.  Abdominal Wound VAC in place.,   CNS: Alert, awake, oriented x 3 Psychiatry: Excited that he is finally going home today Extremities: No pedal edema, no calf  tenderness  Follow ups:    Follow-up Information     Edmon Gosling, MD Follow up.   Specialty: General Surgery Contact information: [address] [place] [address] [phone]         [facility] Follow up.   Specialty: Home Health Services Why: Home Health Physical Therapy/Occupational Therapy/Skilled Nursing (RN) Contact information: [address] [place] [address] [phone]         Rae Bugler, MD. Schedule an appointment as soon as possible for a visit in 1 week(s).   Specialty: Family Medicine Contact information: [phone] [address] [place] [address] [phone]         [facility] Follow up.   Why: Technical sales engineer information: [address] [place], [place] [address]  Phone: [phone]                Discharge Instructions:   Discharge Instructions     Diet - low sodium heart healthy   Complete by: As directed    Discharge instructions   Complete by: As directed    Recommendations at discharge:   You have been started on Eliquis  which is a blood thinner.  Please watch out for any obvious bleeding or black stool or excessive bruisability.  You have been started on Proscar for enlarged prostate.  Follow-up with urology.  Outpatient referral given.  Follow-up with general surgery for wound VAC management  Continue MiraLAX as needed to avoid constipation  Use Lasix  as needed for pedal edema, shortness of breath.  Follow-up with cardiology as an outpatient   You have been started on a blood thinner.  Please watch out for any obvious bleeding, black stool or easy bruisability.  Please contact your primary care provider for further recommendation.    Discharge instructions for CHF Check weight daily -preferably same time every day. Restrict fluid intake to 1200 ml daily Restrict salt intake to less than 2 g daily. Call MD if you have one of the following  symptoms 1) 3 pound weight gain in 24 hours or 5 pounds in 1 week  2) swelling in the hands, feet or stomach  3) progressive shortness of breath 4) if you have to sleep on extra pillows at night in order to breathe       General discharge instructions: Follow with Primary MD Rae Bugler, MD in 7 days  Please request your PCP  to go over your hospital tests, procedures, radiology results at the follow up. Please get your medicines reviewed and adjusted.  Your PCP may decide to repeat  certain labs or tests as needed. Do not drive, operate heavy machinery, perform activities at heights, swimming or participation in water activities or provide baby sitting services if your were admitted for syncope or siezures until you have seen by Primary MD or a Neurologist and advised to do so again. [place]  Controlled Substance Reporting System database was reviewed. Do not drive, operate heavy machinery, perform activities at heights, swim, participate in water activities or provide baby-sitting services while on medications for pain, sleep and mood until your outpatient physician has reevaluated you and advised to do so again.  You are strongly recommended to comply with the dose, frequency and duration of prescribed medications. Activity: As tolerated with Full fall precautions use walker/cane & assistance as needed Avoid using any recreational substances like cigarette, tobacco, alcohol, or non-prescribed drug. If you experience worsening of your admission symptoms, develop shortness of breath, life threatening emergency, suicidal or homicidal thoughts you must seek medical attention immediately by calling 911 or calling your MD immediately  if symptoms less severe. You must read complete instructions/literature along with all the possible adverse reactions/side effects for all the medicines you take and that have been prescribed to you. Take any new medicine only after you have completely understood  and accepted all the possible adverse reactions/side effects.  Wear Seat belts while driving. You were cared for by a hospitalist during your hospital stay. If you have any questions about your discharge medications or the care you received while you were in the hospital after you are discharged, you can call the unit and ask to speak with the hospitalist or the covering physician. Once you are discharged, your primary care physician will handle any further medical issues. Please note that NO REFILLS for any discharge medications will be authorized once you are discharged, as it is imperative that you return to your primary care physician (or establish a relationship with a primary care physician if you do not have one).   Discharge wound care:   Complete by: As directed    Negative pressure wound therapy Comments: Ok to change 2x weekly on M-R Question Answer Comment Amount of suction? 125 mm/Hg   Suction Type? Continuous   Increase activity slowly   Complete by: As directed        Discharge Medications:   Allergies as of 04/30/2024       Reactions   Aspirin    81 mg - epistaxis/gums bleeding   Codeine    hallucinations        Medication List     STOP taking these medications    amiodarone  200 MG tablet Commonly known as: PACERONE    diltiazem  240 MG 24 hr capsule Commonly known as: CARDIZEM  CD   doxycycline 100 MG capsule Commonly known as: VIBRAMYCIN   predniSONE 50 MG tablet Commonly known as: DELTASONE       TAKE these medications    albuterol 108 (90 Base) MCG/ACT inhaler Commonly known as: VENTOLIN HFA Inhale 2 puffs into the lungs every 6 (six) hours as needed for wheezing or shortness of breath.   ascorbic acid 500 MG tablet Commonly known as: VITAMIN C Take 1 tablet (500 mg total) by mouth 2 (two) times daily.   Eliquis  5 MG Tabs tablet Generic drug: apixaban  Take 1 tablet (5 mg total) by mouth 2 (two) times daily.   finasteride 5 MG  tablet Commonly known as: PROSCAR Take 1 tablet (5 mg total) by mouth daily.   furosemide  40  MG tablet Commonly known as: LASIX  Take 1 tablet (40 mg total) by mouth daily as needed (Weight gain > 3lbs in a day or 5 lbs in a week).   levothyroxine  50 MCG tablet Commonly known as: SYNTHROID  Take 50 mcg by mouth daily before breakfast.   metoprolol  succinate 100 MG 24 hr tablet Commonly known as: TOPROL -XL Take 1 tablet (100 mg total) by mouth daily. Take with or immediately following a meal.   multivitamin with minerals Tabs tablet Take 1 tablet by mouth daily.   pantoprazole  40 MG tablet Commonly known as: PROTONIX  Take 1 tablet (40 mg total) by mouth daily.   polycarbophil 625 MG tablet Commonly known as: FIBERCON Take 1 tablet (625 mg total) by mouth in the morning and at bedtime.               Durable Medical Equipment  (From admission, onward)           Start     Ordered   04/30/24 1001  For home use only DME 4 wheeled rolling walker with seat  Once       Question:  Patient needs a walker to treat with the following condition  Answer:  Impaired mobility   04/30/24 1000   04/24/24 1559  For home use only DME Negative pressure wound device  Once       Question Answer Comment  Frequency of dressing change 2 times per week   Length of need 3 Months   Dressing type Foam   Amount of suction 125 mm/Hg   Pressure application Continuous pressure   Supplies 10 canisters and 15 dressings per month for duration of therapy      04/24/24 1559              Discharge Care Instructions  (From admission, onward)           Start     Ordered   04/26/24 0000  Discharge wound care:       Comments: Negative pressure wound therapy Comments: Ok to change 2x weekly on M-R Question Answer Comment Amount of suction? 125 mm/Hg   Suction Type? Continuous   04/26/24 1224             The results of significant diagnostics from this hospitalization (including  imaging, microbiology, ancillary and laboratory) are listed below for reference.    Procedures and Diagnostic Studies:   ECHOCARDIOGRAM LIMITED Result Date: 04/13/2024    ECHOCARDIOGRAM LIMITED REPORT   Patient Name:   Donald Morales Date of Exam: 04/13/2024 Medical Rec #:  [id number]       Height:       69.0 in Accession #:    [id number]      Weight:       209.7 lb Date of Birth:  August 03, 1956       BSA:          2.108 m Patient Age:    67 years        BP:           118/86 mmHg Patient Gender: M               HR:           92 bpm. Exam Location:  Inpatient Procedure: Limited Echo, Cardiac Doppler, Limited Color Doppler and Intracardiac            Opacification Agent (Both Spectral and Color Flow Doppler were  utilized during procedure). Indications:    CHF  History:        Patient has prior history of Echocardiogram examinations, most                 recent 02/07/2022. Arrythmias:Atrial Fibrillation and RBBB,                 Signs/Symptoms:Shortness of Breath and Edema; Risk                 Factors:Hypertension and Dyslipidemia. PVC, PAC, lower extremity                 edema.  Sonographer:    Juanita Shaw Referring Phys: [id number] JONATHAN B DEWALD IMPRESSIONS  1. Left ventricular ejection fraction, by estimation, is 30 to 35%. The left ventricle has moderately decreased function. The left ventricle demonstrates regional wall motion abnormalities (see scoring diagram/findings for description). The left ventricular internal cavity size was mildly dilated.  2. Right ventricular systolic function is moderately reduced. The right ventricular size is moderately enlarged.  3. Moderate to severe mitral valve regurgitation.  4. Tricuspid valve regurgitation is moderate.  5. The aortic valve is tricuspid. Aortic valve regurgitation is not visualized. No aortic stenosis is present. FINDINGS  Left Ventricle: Left ventricular ejection fraction, by estimation, is 30 to 35%. The left ventricle has moderately decreased  function. The left ventricle demonstrates regional wall motion abnormalities. The left ventricular internal cavity size was mildly dilated.  LV Wall Scoring: The inferior wall, mid inferoseptal segment, and basal inferoseptal segment are akinetic. The entire anterior wall, antero-lateral wall, entire anterior septum, entire apex, and basal inferolateral segment are hypokinetic. Right Ventricle: The right ventricular size is moderately enlarged. Right ventricular systolic function is moderately reduced. Mitral Valve: Moderate to severe mitral valve regurgitation. Tricuspid Valve: Tricuspid valve regurgitation is moderate. Aortic Valve: The aortic valve is tricuspid. Aortic valve regurgitation is not visualized. No aortic stenosis is present. LEFT VENTRICLE PLAX 2D LVIDd:         5.50 cm LVIDs:         4.50 cm LV PW:         1.00 cm LV IVS:        0.80 cm  LV Volumes (MOD) LV vol d, MOD A2C: 247.0 ml LV vol d, MOD A4C: 178.5 ml LV vol s, MOD A2C: 149.0 ml LV vol s, MOD A4C: 107.9 ml LV SV MOD A2C:     98.0 ml LV SV MOD A4C:     178.5 ml LV SV MOD BP:      86.0 ml RIGHT VENTRICLE RV S prime:     6.74 cm/s TAPSE (M-mode): 1.1 cm MR Peak grad:   80.3 mmHg    TRICUSPID VALVE MR Mean grad:   47.7 mmHg    TR Peak grad:   24.8 mmHg MR Vmax:        448.00 cm/s  TR Vmax:        249.00 cm/s MR Vmean:       313.7 cm/s MR PISA:        0.57 cm MR PISA Radius: 0.30 cm Kardie Tobb DO Electronically signed by Jerryl Morin DO Signature Date/Time: 04/13/2024/1:47:35 PM    Final    DG Abd 1 View Result Date: 04/13/2024 CLINICAL DATA:  Evaluate NG tube placement EXAM: ABDOMEN - 1 VIEW COMPARISON:  CT 04/12/2024 . FINDINGS: The enteric tube tip and side port are below the hemidiaphragms within the gastric fundus. Multiple  left kidney stones are again noted. IMPRESSION: Enteric tube tip and side port are below the hemidiaphragms within the gastric fundus. Electronically Signed   By: Kimberley Penman M.D.   On: 04/13/2024 10:33   DG CHEST  PORT 1 VIEW Result Date: 04/13/2024 CLINICAL DATA:  Intubation EXAM: PORTABLE CHEST 1 VIEW COMPARISON:  08/16/2022 FINDINGS: Cardiomegaly.  Negative mediastinal contours given rotation. Enteric tube with tip reaching the stomach. Endotracheal tube with tip between the clavicular heads and carina. Right IJ line with tip at the SVC. There is no edema, consolidation, effusion, or pneumothorax. Excluded lateral left base. IMPRESSION: Unremarkable hardware. Cardiomegaly without visible acute cardiopulmonary disease. Electronically Signed   By: Ronnette Coke M.D.   On: 04/13/2024 07:12     Labs:   Basic Metabolic Panel: Recent Labs  Lab 04/24/24 0207 04/25/24 0500 04/26/24 0221 04/27/24 0503 04/28/24 0441 04/28/24 0443 04/29/24 0416  NA 131* 132* 132* 132*  --  132* 130*  K 3.8 3.8 3.7 3.6  --  3.7 3.8  CL 100 103 105 104  --  104 105  CO2 24 23 20* 20*  --  20* 20*  GLUCOSE 121* 124* 96 118*  --  112* 115*  BUN 27* 24* 23 21  --  17 14  CREATININE 0.86 0.80 0.90 1.07  --  1.13 1.03  CALCIUM 7.3* 7.3* 7.3* 7.7*  --  7.7* 7.5*  MG 1.9 2.2  --  2.2 2.1  --   --   PHOS 3.5  --   --   --   --   --   --    GFR Estimated Creatinine Clearance (by C-G formula based on SCr of 1.03 mg/dL) Male: 64 mL/min Male: 78.1 mL/min Liver Function Tests: Recent Labs  Lab 04/24/24 0207  AST 31  ALT 39  ALKPHOS 79  BILITOT 1.5*  PROT 5.5*  ALBUMIN  2.1*   No results for input(s): "LIPASE", "AMYLASE" in the last 168 hours. No results for input(s): "AMMONIA" in the last 168 hours. Coagulation profile No results for input(s): "INR", "PROTIME" in the last 168 hours.  CBC: Recent Labs  Lab 04/25/24 0500 04/26/24 0221 04/27/24 0503 04/28/24 0443 04/29/24 0416  WBC 8.6 7.4 5.5 5.5 5.5  HGB 7.5* 7.9* 8.4* 8.7* 8.8*  HCT 24.6* 25.9* 27.3* 28.3* 29.5*  MCV 82.6 82.7 82.5 82.5 84.0  PLT 182 212 257 301 340   Cardiac Enzymes: No results for input(s): "CKTOTAL", "CKMB", "CKMBINDEX",  "TROPONINI" in the last 168 hours. BNP: Invalid input(s): "POCBNP" CBG: Recent Labs  Lab 04/24/24 2015 04/25/24 0726 04/25/24 2033 04/26/24 0804 04/27/24 0857  GLUCAP 125* 118* 92 108* 122*   D-Dimer No results for input(s): "DDIMER" in the last 72 hours. Hgb A1c No results for input(s): "HGBA1C" in the last 72 hours. Lipid Profile No results for input(s): "CHOL", "HDL", "LDLCALC", "TRIG", "CHOLHDL", "LDLDIRECT" in the last 72 hours. Thyroid function studies No results for input(s): "TSH", "T4TOTAL", "T3FREE", "THYROIDAB" in the last 72 hours.  Invalid input(s): "FREET3" Anemia work up No results for input(s): "VITAMINB12", "FOLATE", "FERRITIN", "TIBC", "IRON", "RETICCTPCT" in the last 72 hours. Microbiology No results found for this or any previous visit (from the past 240 hours).  Time coordinating discharge: 45 minutes  Signed: Amante Fomby  [facility] 04/30/2024, 11:19 AM

## 2024-05-08 ENCOUNTER — Other Ambulatory Visit (HOSPITAL_COMMUNITY): Payer: Self-pay

## 2024-05-12 DIAGNOSIS — T8189XA Other complications of procedures, not elsewhere classified, initial encounter: Secondary | ICD-10-CM | POA: Diagnosis not present

## 2024-05-12 DIAGNOSIS — Z4689 Encounter for fitting and adjustment of other specified devices: Secondary | ICD-10-CM | POA: Diagnosis not present

## 2024-05-12 DIAGNOSIS — I4892 Unspecified atrial flutter: Secondary | ICD-10-CM | POA: Diagnosis not present

## 2024-05-12 DIAGNOSIS — I959 Hypotension, unspecified: Secondary | ICD-10-CM | POA: Diagnosis not present

## 2024-05-12 DIAGNOSIS — N400 Enlarged prostate without lower urinary tract symptoms: Secondary | ICD-10-CM | POA: Diagnosis not present

## 2024-05-12 DIAGNOSIS — R5381 Other malaise: Secondary | ICD-10-CM | POA: Diagnosis not present

## 2024-05-12 DIAGNOSIS — S31605A Unspecified open wound of abdominal wall, periumbilic region with penetration into peritoneal cavity, initial encounter: Secondary | ICD-10-CM | POA: Diagnosis not present

## 2024-05-12 NOTE — Telephone Encounter (Signed)
 See notes taken today from operator Evalyn Hillier.   I will fax notes to requesting office reflecting the pt cancelled his appt for 05/16/24 and rescheduled to 06/26/24 with Palmer Bobo, NP.

## 2024-05-12 NOTE — Telephone Encounter (Signed)
 [facility] requesting a fax sent over stating the pt canceled his appt on 06/13 and rescheduled for 7/24.  [phone]

## 2024-05-16 ENCOUNTER — Ambulatory Visit: Admitting: Emergency Medicine

## 2024-05-29 ENCOUNTER — Telehealth: Payer: Self-pay | Admitting: Cardiovascular Disease

## 2024-05-29 NOTE — Telephone Encounter (Signed)
 Pt c/o medication issue:  1. Name of Medication:   metoprolol  succinate (TOPROL -XL) 100 MG 24 hr tablet   2. How are you currently taking this medication (dosage and times per day)?   As prescribed  3. Are you having a reaction (difficulty breathing--STAT)?   4. What is your medication issue?   Patient is concerned that this medication is causing his BP to drop too low and wants to know if this medication needs to be reduced.

## 2024-05-29 NOTE — Telephone Encounter (Signed)
 Spoke with pt, he was recently discharged from the hospital and was given metoprolol  succ 100 mg once daily. He reports he is taking it in the morning and his blood pressure is running 90/60 to 95/70. He reports dizziness and no energy when his blood pressure is low. He did not take the metoprolol  today and his blood pressure is 117/80. His heart rate is 60-90 bpm. Explained to the patient the reason for the metoprolol  and dose. He does not want to continue the metoprolol  at that dose. He will try taking the metoprolol  in the evening to see if it is better tolerated. He was also given the okay to try 50 mg BID. His follow up appointment is out for 1 month. Will forward to dr nancey to review.

## 2024-05-30 ENCOUNTER — Other Ambulatory Visit (HOSPITAL_COMMUNITY): Payer: Self-pay

## 2024-05-30 DIAGNOSIS — T8189XA Other complications of procedures, not elsewhere classified, initial encounter: Secondary | ICD-10-CM | POA: Diagnosis not present

## 2024-05-30 DIAGNOSIS — S31605A Unspecified open wound of abdominal wall, periumbilic region with penetration into peritoneal cavity, initial encounter: Secondary | ICD-10-CM | POA: Diagnosis not present

## 2024-06-02 NOTE — Telephone Encounter (Signed)
 Spoke with patient, states he did reduce his metoprolol  dose to 50 mg once daily and that his blood pressure and heart rate has been doing well since. Patient states his symptoms have resolved. Confirmed upcoming appointment with [facility] on 06/26/24 at 1:30 pm. No needs at this time

## 2024-06-24 ENCOUNTER — Encounter: Payer: Self-pay | Admitting: Emergency Medicine

## 2024-06-24 ENCOUNTER — Encounter: Payer: Self-pay | Admitting: *Deleted

## 2024-06-26 ENCOUNTER — Ambulatory Visit: Admitting: Emergency Medicine

## 2024-07-04 NOTE — Telephone Encounter (Signed)
 [facility] called in asking for a fax to reflect that pt cancelled his appt from 7/24 to 8/26.

## 2024-07-04 NOTE — Telephone Encounter (Signed)
 Pt no longer having appt on 06/26/24.   Pt now has appt IN OFFICE Preop appt 07/29/24 with Lum Hedges, NP  Will update surgeons office.

## 2024-07-29 ENCOUNTER — Ambulatory Visit: Attending: Emergency Medicine | Admitting: Emergency Medicine

## 2024-07-29 ENCOUNTER — Encounter: Payer: Self-pay | Admitting: Emergency Medicine

## 2024-07-29 VITALS — BP 128/68 | HR 61 | Ht 68.0 in | Wt 194.0 lb

## 2024-07-29 DIAGNOSIS — I10 Essential (primary) hypertension: Secondary | ICD-10-CM

## 2024-07-29 DIAGNOSIS — I4819 Other persistent atrial fibrillation: Secondary | ICD-10-CM | POA: Diagnosis not present

## 2024-07-29 DIAGNOSIS — Z01810 Encounter for preprocedural cardiovascular examination: Secondary | ICD-10-CM | POA: Diagnosis not present

## 2024-07-29 DIAGNOSIS — I5020 Unspecified systolic (congestive) heart failure: Secondary | ICD-10-CM | POA: Diagnosis not present

## 2024-07-29 DIAGNOSIS — I509 Heart failure, unspecified: Secondary | ICD-10-CM | POA: Diagnosis not present

## 2024-07-29 DIAGNOSIS — I340 Nonrheumatic mitral (valve) insufficiency: Secondary | ICD-10-CM | POA: Diagnosis not present

## 2024-07-29 DIAGNOSIS — I452 Bifascicular block: Secondary | ICD-10-CM | POA: Diagnosis not present

## 2024-07-29 NOTE — Progress Notes (Signed)
 Cardiology Office Note:    Date:  07/29/2024  ID:  Donald Morales, DOB Oct 06, 1956, MRN [id number] PCP: Seabron Lenis, MD  [facility] Providers Cardiologist:  Lonni LITTIE Nanas, MD Electrophysiologist:  Eulas FORBES Furbish, MD       Patient Profile:       Chief Complaint: Hospital follow-up History of Present Illness:  Donald Morales is a 68 y.o. male with visit-pertinent history of longstanding persistent atrial fibrillation/flutter, HFrEF, hypertension, hyperlipidemia, hypothyroidism  Echocardiogram 02/2022 showed LVEF 35 to 40%, wall motion abnormalities, mild LVH, RV function mildly reduced and mildly enlarged, left atrial size severely dilated, right atrial size mildly dilated, trivial mitral valve regurgitation.  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  He underwent initial small bowel resection on 5/11 and a second look procedure on 5/12 with abdominal resection.  He improved subsequently with resolution of shock and is now off pressors with normalization of white count.  Postoperatively the patient had atrial fibrillation with RVR and was treated with amiodarone .  Echocardiogram 5/11 showed LVEF of 30 to 35% with regional wall motion abnormalities and moderate to severe MR and moderate TR.  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  Ultimately amiodarone  was discontinued.  Hemoglobin dropped from 9.5-7 on 5/19, received 1 unit PRBCs. Felt to have some anastomotic bleeding.  He was started on Toprol -XL, Eliquis , and Lasix  as needed.  He was discharged and rate controlled atrial fibrillation.  Plan was to consider outpatient ischemic evaluation.   Discussed the use of AI scribe software for clinical note transcription with the patient, who gave verbal consent to proceed.  History of Present Illness Donald Morales is  a 68 year old male with atrial fibrillation and heart failure who presents for follow-up.  Today patient tells me he is doing well overall.  He denies any acute concerns or complaints today.  He reports that he is cardiac aware of his arrhythmia in the past and experiences erratic feelings and uneasiness during atrial fibrillation but is asymptomatic currently.  He remains active, engaging in yard work and part-time work in [employer] and crowd [employer]. He feels refreshed upon waking and does not experience excessive daytime fatigue.  He denies any chest pains, dyspnea, orthopnea, PND, syncope, presyncope, palpitations, leg swelling, melena, hematochezia   Review of systems:  Please see the history of present illness. All other systems are reviewed and otherwise negative.      Studies Reviewed:    EKG Interpretation Date/Time:  Tuesday July 29 2024 10:16:14 EDT Ventricular Rate:  61 PR Interval:  158 QRS Duration:  132 QT Interval:  488 QTC Calculation: 491 R Axis:   131  Text Interpretation: Sinus rhythm with Premature atrial complexes Right bundle branch block Left posterior fascicular block Bifascicular block When compared with ECG of 25-Apr-2024 16:49, Sinus rhythm has replaced Atrial fibrillation Confirmed by Rana Dixon [phone]) on 07/29/2024 1:07:56 PM    Echocardiogram Limited 04/13/2024 1. Left ventricular ejection fraction, by estimation, is 30 to 35%. The  left ventricle has moderately decreased function. The left ventricle  demonstrates regional wall motion abnormalities (see scoring  diagram/findings for description). The left  ventricular internal cavity size was mildly dilated.   2. Right ventricular systolic function is moderately reduced. The right  ventricular size is moderately enlarged.   3. Moderate to severe mitral valve regurgitation.  4. Tricuspid valve regurgitation is moderate.   5. The aortic valve is tricuspid. Aortic valve regurgitation is not   visualized. No aortic stenosis is present.   Echocardiogram 02/07/2022 1. Left ventricular ejection fraction, by estimation, is 35 to 40%. The  left ventricle has moderately decreased function. The left ventricle  demonstrates regional wall motion abnormalities (see scoring  diagram/findings for description). The left  ventricular internal cavity size was mildly dilated. There is mild left  ventricular hypertrophy. Left ventricular diastolic parameters are  indeterminate.   2. Right ventricular systolic function is mildly reduced. The right  ventricular size is mildly enlarged. Tricuspid regurgitation signal is  inadequate for assessing PA pressure.   3. Left atrial size was severely dilated.   4. Right atrial size was moderately dilated.   5. The mitral valve is normal in structure. Trivial mitral valve  regurgitation. No evidence of mitral stenosis.   6. The aortic valve is tricuspid. Aortic valve regurgitation is trivial.  No aortic stenosis is present.   7. The inferior vena cava is dilated in size with >50% respiratory  variability, suggesting right atrial pressure of 8 mmHg.  Risk Assessment/Calculations:    CHA2DS2-VASc Score = 2   This indicates a 2.2% annual risk of stroke. The patient's score is based upon: CHF History: 0 HTN History: 1 Diabetes History: 0 Stroke History: 0 Vascular Disease History: 0 Age Score: 1 Gender Score: 0             Physical Exam:   VS:  BP 128/68 (BP Location: Right Arm, Patient Position: Sitting, Cuff Size: Normal)   Pulse 61   Ht 5' 8 (1.727 m)   Wt 194 lb (88 kg)   BMI 29.50 kg/m    Wt Readings from Last 3 Encounters:  07/29/24 194 lb (88 kg)  04/26/24 191 lb 9.3 oz (86.9 kg)  10/17/22 193 lb 3.2 oz (87.6 kg)    GEN: Well nourished, well developed in no acute distress NECK: No JVD; No carotid bruits CARDIAC: RRR, no murmurs, rubs, gallops RESPIRATORY:  Clear to auscultation without rales, wheezing or rhonchi  ABDOMEN:  Soft, non-tender, non-distended EXTREMITIES:  No edema; No acute deformity      Assessment and Plan:  Longstanding persistent atrial fibrillation Admitted 04/2024 w/ atrial fibrillation with RVR in the setting of SMV thrombosis with small bowel ischemia s/p surgical resection of the bowel on 5/11 and 5/12 - EKG today shows patient is maintaining NSR  - Patient denies symptoms concerning for recurrent atrial fibrillation - Continue metoprolol  XL 50 mg daily - Continue Eliquis  5 mg twice daily, no bleeding concerns  Chronic HFrEF Echocardiogram 02/2022 LVEF 35 to 40% Echocardiogram 04/2024 LVEF 30 to 35% with wall motion abnormalities in the inferior, anterior, and lateral walls which is not new and previously documented in March 2023 echocardiogram. 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  NYHA class I - Plan for further ischemic evaluation given his cardiomyopathy with Lexiscan  Myoview - Plan to repeat echocardiogram x 3 months - Continue furosemide  40 mg as needed and metoprolol  XL 50 mg daily.  Will further titrate GDMT pending repeat echocardiogram - CBC and BMET today  Moderate to severe MR Echocardiogram 04/2024 showed moderate to severe mitral valve regurgitation - Likely functional in the setting of systolic dysfunction - Repeat echocardiogram x 3 months  Hypertension Blood pressure today is 128/68 and well-controlled -  Continue current antihypertensive regimen    Informed Consent   Shared Decision Making/Informed Consent The risks [chest pain, shortness of breath, cardiac arrhythmias, dizziness, blood pressure fluctuations, myocardial infarction, stroke/transient ischemic attack, nausea, vomiting, allergic reaction, radiation exposure, metallic taste sensation and life-threatening complications (estimated to be 1 in 10,000)], benefits (risk stratification,  diagnosing coronary artery disease, treatment guidance) and alternatives of a nuclear stress test were discussed in detail with Mr. Rutigliano and he agrees to proceed.     Dispo:  Return in about 2 months (around 09/28/2024).  Signed, Lum LITTIE Louis, NP

## 2024-07-29 NOTE — Patient Instructions (Signed)
 Medication Instructions:  NO CHANGES   Lab Work: BMET AND CBC TO BE DONE TODAY.   Testing/Procedures: Your physician has requested that you have an echocardiogram. Echocardiography is a painless test that uses sound waves to create images of your heart. It provides your doctor with information about the size and shape of your heart and how well your heart's chambers and valves are working. This procedure takes approximately one hour. There are no restrictions for this procedure. Please do NOT wear cologne, perfume, aftershave, or lotions (deodorant is allowed). Please arrive 15 minutes prior to your appointment time.  Please note: We ask at that you not bring children with you during ultrasound (echo/ vascular) testing. Due to room size and safety concerns, children are not allowed in the ultrasound rooms during exams. Our front office staff cannot provide observation of children in our lobby area while testing is being conducted. An adult accompanying a patient to their appointment will only be allowed in the ultrasound room at the discretion of the ultrasound technician under special circumstances. We apologize for any inconvenience.   Your physician has requested that you have a lexiscan  myoview. For further information please visit [email or web address] Please follow instruction sheet, as given.    Follow-Up: At [facility], you and your health needs are our priority.  As part of our continuing mission to provide you with exceptional heart care, our providers are all part of one team.  This team includes your primary Cardiologist (physician) and Advanced Practice Providers or APPs (Physician Assistants and Nurse Practitioners) who all work together to provide you with the care you need, when you need it.  Your next appointment:   3 MONTHS  Provider:   DR. KATE, MD

## 2024-07-30 ENCOUNTER — Ambulatory Visit: Payer: Self-pay | Admitting: Emergency Medicine

## 2024-07-30 LAB — CBC
Hematocrit: 33.7 % — ABNORMAL LOW (ref 37.5–51.0)
Hemoglobin: 9.6 g/dL — ABNORMAL LOW (ref 13.0–17.7)
MCH: 21.5 pg — ABNORMAL LOW (ref 26.6–33.0)
MCHC: 28.5 g/dL — ABNORMAL LOW (ref 31.5–35.7)
MCV: 76 fL — ABNORMAL LOW (ref 79–97)
Platelets: 283 x10E3/uL (ref 150–450)
RBC: 4.46 x10E6/uL (ref 4.14–5.80)
RDW: 15.5 % — ABNORMAL HIGH (ref 11.6–15.4)
WBC: 6.8 x10E3/uL (ref 3.4–10.8)

## 2024-07-30 LAB — BASIC METABOLIC PANEL WITH GFR
BUN/Creatinine Ratio: 10 (ref 10–24)
BUN: 11 mg/dL (ref 8–27)
CO2: 22 mmol/L (ref 20–29)
Calcium: 9.1 mg/dL (ref 8.6–10.2)
Chloride: 103 mmol/L (ref 96–106)
Creatinine, Ser: 1.11 mg/dL (ref 0.76–1.27)
Glucose: 92 mg/dL (ref 70–99)
Potassium: 4.5 mmol/L (ref 3.5–5.2)
Sodium: 137 mmol/L (ref 134–144)
eGFR: 73 mL/min/1.73 (ref >59)

## 2024-08-05 ENCOUNTER — Telehealth: Payer: Self-pay

## 2024-08-05 NOTE — Telephone Encounter (Signed)
   Pre-operative Risk Assessment    Patient Name: Donald Morales  DOB: 1956/02/23 MRN: [id number]   Date of last office visit: 07/29/24 MADISON FOUNTAIN, NP Date of next office visit: 10/28/24 LONNI NANAS, MD   Request for Surgical Clearance    Procedure:  COLONOSCOPY/ ENDOSCOPY  Date of Surgery:  Clearance TBD                                Surgeon:  DR JERRELL SOL Surgeon's Group or Practice Name:  [facility] Phone number:  [phone] Fax number:  [phone]   Type of Clearance Requested:   - Medical  - Pharmacy:  Hold Apixaban  (Eliquis ) 1-2 DAYS PRIOR & HAVE IN OUR ENDOSCOPY CENTER   Type of Anesthesia:  PROPOFOL    Additional requests/questions:    Signed, Lucie DELENA Ku   08/05/2024, 4:12 PM

## 2024-08-07 NOTE — Telephone Encounter (Signed)
 Patient with diagnosis of atrial fibrillation on Eliquis  for anticoagulation.    Procedure:  COLONOSCOPY/ ENDOSCOPY   Date of Surgery:  Clearance TBD      CHA2DS2-VASc Score = 3   This indicates a 3.2% annual risk of stroke. The patient's score is based upon: CHF History: 1 HTN History: 1 Diabetes History: 0 Stroke History: 0 Vascular Disease History: 0 Age Score: 1 Gender Score: 0  CrCl 81 Platelet count 283  Patient has not had an Afib/aflutter ablation or Watchman within the last 3 months or DCCV within the last 30 days   Per office protocol, patient can hold Eliquis  for 2 days prior to procedure.   Patient will not need bridging with Lovenox  (enoxaparin ) around procedure.  **This guidance is not considered finalized until pre-operative APP has relayed final recommendations.**

## 2024-08-07 NOTE — Telephone Encounter (Signed)
 Pre-op team,  Patient is pending echo and stress test scheduled for 10/14 and 10/15.  Further, patient is scheduled for in-person follow up with Dr. Kate on 11/25. Please let requesting office know pre-op risk assessment cannot be completed until after testing and follow up.   Thank you!  DW

## 2024-08-07 NOTE — Telephone Encounter (Signed)
 I will update the requesting office the pt will need cardiac testing and follow up as planned before the pt can be cleared. Please see notes from preop APP Barnie Hila, NP.

## 2024-09-08 ENCOUNTER — Encounter (HOSPITAL_COMMUNITY): Payer: Self-pay | Admitting: *Deleted

## 2024-09-10 ENCOUNTER — Other Ambulatory Visit (HOSPITAL_COMMUNITY): Payer: Self-pay

## 2024-09-10 ENCOUNTER — Other Ambulatory Visit (HOSPITAL_COMMUNITY): Payer: Self-pay | Admitting: Student

## 2024-09-11 NOTE — Telephone Encounter (Signed)
Patient notat this clinic

## 2024-09-12 ENCOUNTER — Other Ambulatory Visit (HOSPITAL_COMMUNITY): Payer: Self-pay

## 2024-09-16 ENCOUNTER — Other Ambulatory Visit: Payer: Self-pay | Admitting: Emergency Medicine

## 2024-09-16 ENCOUNTER — Encounter (HOSPITAL_COMMUNITY)

## 2024-09-16 ENCOUNTER — Ambulatory Visit (HOSPITAL_COMMUNITY)
Admission: RE | Admit: 2024-09-16 | Discharge: 2024-09-16 | Disposition: A | Discharge status: Home / Self Care | Source: Ambulatory Visit | Attending: Internal Medicine | Admitting: Internal Medicine

## 2024-09-16 DIAGNOSIS — I5020 Unspecified systolic (congestive) heart failure: Secondary | ICD-10-CM

## 2024-09-16 DIAGNOSIS — I4819 Other persistent atrial fibrillation: Secondary | ICD-10-CM | POA: Insufficient documentation

## 2024-09-16 LAB — ECHOCARDIOGRAM COMPLETE
Area-P 1/2: 2.56 cm2
S' Lateral: 4.6 cm

## 2024-09-17 ENCOUNTER — Ambulatory Visit (HOSPITAL_COMMUNITY)

## 2024-09-23 NOTE — Telephone Encounter (Signed)
 Pt returning call

## 2024-10-02 ENCOUNTER — Other Ambulatory Visit (HOSPITAL_COMMUNITY): Payer: Self-pay

## 2024-10-27 NOTE — Progress Notes (Unsigned)
 Cardiology Office Note:    Date:  10/28/2024   ID:  Donald Morales, DOB December 16, 1955, MRN [id number]  PCP:  Seabron Lenis, MD  Cardiologist:  Lonni LITTIE Nanas, MD  Electrophysiologist:  Eulas FORBES Furbish, MD   Referring MD: Seabron Lenis, MD   Chief Complaint  Patient presents with   Atrial Fibrillation    History of Present Illness:    Donald Morales is a 68 y.o. male with a hx of persistent atrial fibrillation, chronic systolic heart failure, hypothyroidism, hypertension, hyperlipidemia who presents for follow-up.  Echocardiogram 02/2022 showed EF 35 to 40%.  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  Underwent small bowel resection.  Postoperatively noted to have A-fib with RVR.  Echocardiogram 04/13/2024 showed EF 30 to 35%, moderate to severe MR and moderate TR.  At follow-up appointment 07/2024 he was in sinus rhythm.  Repeat echo 09/2024 showed EF 50 to 55%, mild MR.    Since last clinic visit, he reports he is doing okay.  Does report having some dyspnea on exertion.  Reports has not been exercising. Denies any chest pain,  lightheadedness, syncope, lower extremity edema, or palpitations.  Some dyspnea with exertion. Reports dyspnea with walking up stairs.  Has not needed any lasix .  Taking Eliquis , denies any bleeding issues.     Past Medical History:  Diagnosis Date   BPH (benign prostatic hyperplasia)    History of epistaxis    Hyperlipidemia    Muscle ache    Persistent atrial fibrillation (HCC)    Premature ventricular contraction    RBBB    RBBB    Thyroid disease    Typical atrial flutter [facility])     Past Surgical History:  Procedure Laterality Date   APPLICATION OF WOUND VAC  04/13/2024   Procedure: APPLICATION, WOUND VAC;  Surgeon: Ann Fine, MD;  Location: [facility];  Service: General;;   BACK SURGERY     BOWEL  RESECTION  04/13/2024   Procedure: EXCISION, SMALL INTESTINE;  Surgeon: Ann Fine, MD;  Location: [facility];  Service: General;;   LAPAROSCOPY N/A 04/13/2024   Procedure: LAPAROSCOPY, DIAGNOSTIC;  Surgeon: Ann Fine, MD;  Location: [facility];  Service: General;  Laterality: N/A;   LAPAROTOMY  04/13/2024   Procedure: LAPAROTOMY;  Surgeon: Ann Fine, MD;  Location: [facility];  Service: General;;   LAPAROTOMY N/A 04/14/2024   Procedure: LAPAROTOMY, EXPLORATORY SMALL BOWEL RESECTION OF APPROXIMATELY 40 CM OF ILEUM W/ PRIMARY ANASTOMOSIS AND CLOSURE OF ABDOMEN AND PLACEMENT OF SUBCU WOUND VAC;  Surgeon: Vanderbilt Ned, MD;  Location: [facility];  Service: General;  Laterality: N/A;  POSSIBLE ABDOMINAL WOUND CLOSURE POSSIBLE SMALL BOWEL RESECTION   ORCHIECTOMY     TOOTH EXTRACTION      Current Medications: Current Meds  Medication Sig   Acetaminophen  500 MG capsule Take 1 capsule by mouth every 6 (six) hours as needed for pain.   albuterol (VENTOLIN HFA) 108 (90 Base) MCG/ACT inhaler Inhale 2 puffs into the lungs every 6 (six) hours as needed for wheezing or shortness of breath.   ascorbic acid  (VITAMIN C ) 500 MG tablet Take 1 tablet (500 mg total) by mouth 2 (two) times daily.   furosemide  (LASIX ) 40 MG tablet Take 1 tablet (40 mg total) by mouth daily as needed (Weight gain > 3lbs in a day or 5 lbs in a week).   levothyroxine  (  SYNTHROID ) 50 MCG tablet Take 50 mcg by mouth daily before breakfast.   Multiple Vitamin (MULTIVITAMIN WITH MINERALS) TABS tablet Take 1 tablet by mouth daily.   [DISCONTINUED] apixaban  (ELIQUIS ) 5 MG TABS tablet Take 1 tablet (5 mg total) by mouth 2 (two) times daily.   [DISCONTINUED] metoprolol  succinate (TOPROL -XL) 50 MG 24 hr tablet Take 50 mg by mouth daily. Take with or immediately following a meal.     Allergies:   Aspirin and Codeine   Social History   Socioeconomic History   Marital status: Single    Spouse name: Not on file   Number of children: 0    Years of education: college   Highest education level: Not on file  Occupational History   Occupation: [employer]  Tobacco Use   Smoking status: Never   Smokeless tobacco: Never  Vaping Use   Vaping status: Never Used  Substance and Sexual Activity   Alcohol use: Yes    Alcohol/week: 0.0 standard drinks of alcohol    Comment: rare   Drug use: No   Sexual activity: Not Currently  Other Topics Concern   Not on file  Social History Narrative   Lives alone in [place]   Works in custodial department at [employer]   Social Drivers of Health   Financial Resource Strain: Not on file  Food Insecurity: Patient Unable To Answer (04/13/2024)   Hunger Vital Sign    Worried About Running Out of Food in the Last Year: Patient unable to answer    Ran Out of Food in the Last Year: Patient unable to answer  Transportation Needs: Patient Unable To Answer (04/13/2024)   PRAPARE - Transportation    Lack of Transportation (Medical): Patient unable to answer    Lack of Transportation (Non-Medical): Patient unable to answer  Physical Activity: Not on file  Stress: Not on file  Social Connections: Patient Unable To Answer (04/13/2024)   Social Connection and Isolation Panel    Frequency of Communication with Friends and Family: Patient unable to answer    Frequency of Social Gatherings with Friends and Family: Patient unable to answer    Attends Religious Services: Patient unable to answer    Active Member of Clubs or Organizations: Patient unable to answer    Attends Banker Meetings: Patient unable to answer    Marital Status: Patient unable to answer     Family History: The patient's family history includes Alcoholism in his father; Heart disease in his mother.  ROS:   Please see the history of present illness.     All other systems reviewed and are negative.  EKGs/Labs/Other Studies Reviewed:    The following studies were reviewed today:   EKG:    10/28/2024: Normal sinus rhythm, PACs, right bundle branch block, left posterior fascicular block  Recent Labs: 04/15/2024: TSH 11.769 04/16/2024: B Natriuretic Peptide 169.0 04/24/2024: ALT 39 04/28/2024: Magnesium  2.1 07/29/2024: BUN 11; Creatinine, Ser 1.11; Hemoglobin 9.6; Platelets 283; Potassium 4.5; Sodium 137  Recent Lipid Panel    Component Value Date/Time   TRIG 54 04/21/2024 0242    Physical Exam:    VS:  BP 112/72   Pulse (!) 59   Ht 5' 8 (1.727 m)   Wt 200 lb (90.7 kg)   SpO2 98%   BMI 30.41 kg/m     Wt Readings from Last 3 Encounters:  10/28/24 200 lb (90.7 kg)  07/29/24 194 lb (88 kg)  04/26/24 191 lb 9.3  oz (86.9 kg)     GEN:  Well nourished, well developed in no acute distress HEENT: Normal NECK: No JVD; No carotid bruits LYMPHATICS: No lymphadenopathy CARDIAC: RRR, no murmurs, rubs, gallops RESPIRATORY:  Clear to auscultation without rales, wheezing or rhonchi  ABDOMEN: Soft, non-tender, non-distended MUSCULOSKELETAL:  No edema; No deformity  SKIN: Warm and dry NEUROLOGIC:  Alert and oriented x 3 PSYCHIATRIC:  Normal affect   ASSESSMENT:    1. Preoperative clearance   2. Medication management   3. Persistent atrial fibrillation (HCC)   4. Chronic systolic heart failure (HCC)   5. DOE (dyspnea on exertion)   6. Nonrheumatic mitral valve regurgitation    PLAN:    Preop evaluation: Prior to colonoscopy.  Low risk procedure.  Recent echo shows EF has improved to normal.  No further cardiac workup recommended prior to procedure.  Can hold Eliquis  2 days prior to procedure  Persistent atrial fibrillation: Had A-fib with RVR during admission 04/2024 in setting of septic shock from small bowel ischemia due to SMV thrombosis.  Now back in sinus rhythm.  Continue Eliquis  5 mg twice daily.  Continue Toprol -XL 50 mg daily  Chronic systolic heart failure: Echo 04/2024 showed EF 30 to 35%.  Thought to be tachycardia induced in setting of A-fib with RVR as  above.  Repeat echo 09/2024 showed EF 50 to 55% - Continue Toprol -XL 50 mg daily - On as needed Lasix  40 mg daily, reports has not needed to take  DOE: Suspect deconditioning contributing.  Has had anemia since his surgery, may be contributing.  Will check CBC and iron studies, if iron deficiency anemia confirmed will refer to hematology  Mitral regurgitation: Moderate to severe on echo 04/2024.  Likely functional due to systolic dysfunction.  Repeat echo 09/2024 showed improvement in LV systolic function and to mild MR  RTC in 6 months  Medication Adjustments/Labs and Tests Ordered: Current medicines are reviewed at length with the patient today.  Concerns regarding medicines are outlined above.  Orders Placed This Encounter  Procedures   Iron, TIBC and Ferritin Panel   CBC   Comp Met (CMET)   EKG 12-Lead   Meds ordered this encounter  Medications   metoprolol  succinate (TOPROL -XL) 50 MG 24 hr tablet    Sig: Take 1 tablet (50 mg total) by mouth daily. Take with or immediately following a meal.    Dispense:  90 tablet    Refill:  3   apixaban  (ELIQUIS ) 5 MG TABS tablet    Sig: Take 1 tablet (5 mg total) by mouth 2 (two) times daily.    Dispense:  180 tablet    Refill:  3    Pt will have 30 day free card and copay card    Patient Instructions  Medication Instructions:  Your physician recommends that you continue on your current medications as directed. Please refer to the Current Medication list given to you today.  *If you need a refill on your cardiac medications before your next appointment, please call your pharmacy*  Lab Work: Today: CMET, CBC, Iron, Ferritin and TIBC  If you have any lab test that is abnormal or we need to change your treatment, we will call you to review the results.  Testing/Procedures: None ordered  Follow-Up: At [facility], you and your health needs are our priority.  As part of our continuing mission to provide you with exceptional  heart care, our providers are all part of one team.  This  team includes your primary Cardiologist (physician) and Advanced Practice Providers or APPs (Physician Assistants and Nurse Practitioners) who all work together to provide you with the care you need, when you need it.  Your next appointment:   6 month(s)  Provider:   Lonni LITTIE Nanas, MD    Thank you for choosing [facility]!!   [phone]   Other Instructions  Hold Eliquis  2 days prior to your upcoming colonoscopy       Signed, Lonni LITTIE Nanas, MD  10/28/2024 12:56 PM    [facility]

## 2024-10-28 ENCOUNTER — Other Ambulatory Visit: Payer: Self-pay

## 2024-10-28 ENCOUNTER — Ambulatory Visit: Attending: Cardiology | Admitting: Cardiology

## 2024-10-28 ENCOUNTER — Encounter: Payer: Self-pay | Admitting: Cardiology

## 2024-10-28 VITALS — BP 112/72 | HR 59 | Ht 68.0 in | Wt 200.0 lb

## 2024-10-28 DIAGNOSIS — I340 Nonrheumatic mitral (valve) insufficiency: Secondary | ICD-10-CM

## 2024-10-28 DIAGNOSIS — Z79899 Other long term (current) drug therapy: Secondary | ICD-10-CM

## 2024-10-28 DIAGNOSIS — I5022 Chronic systolic (congestive) heart failure: Secondary | ICD-10-CM | POA: Diagnosis not present

## 2024-10-28 DIAGNOSIS — R0609 Other forms of dyspnea: Secondary | ICD-10-CM

## 2024-10-28 DIAGNOSIS — I4819 Other persistent atrial fibrillation: Secondary | ICD-10-CM | POA: Diagnosis not present

## 2024-10-28 DIAGNOSIS — Z01818 Encounter for other preprocedural examination: Secondary | ICD-10-CM | POA: Diagnosis not present

## 2024-10-28 DIAGNOSIS — I5020 Unspecified systolic (congestive) heart failure: Secondary | ICD-10-CM | POA: Diagnosis not present

## 2024-10-28 MED ORDER — METOPROLOL SUCCINATE ER 50 MG PO TB24: 50.0000 mg | ORAL_TABLET | Freq: Every day | ORAL | 3 refills | Status: AC

## 2024-10-28 MED ORDER — APIXABAN 5 MG PO TABS: 5.0000 mg | ORAL_TABLET | Freq: Two times a day (BID) | ORAL | 3 refills | Status: AC

## 2024-10-28 NOTE — Patient Instructions (Addendum)
 Medication Instructions:  Your physician recommends that you continue on your current medications as directed. Please refer to the Current Medication list given to you today.  *If you need a refill on your cardiac medications before your next appointment, please call your pharmacy*  Lab Work: Today: CMET, CBC, Iron, Ferritin and TIBC  If you have any lab test that is abnormal or we need to change your treatment, we will call you to review the results.  Testing/Procedures: None ordered  Follow-Up: At [facility], you and your health needs are our priority.  As part of our continuing mission to provide you with exceptional heart care, our providers are all part of one team.  This team includes your primary Cardiologist (physician) and Advanced Practice Providers or APPs (Physician Assistants and Nurse Practitioners) who all work together to provide you with the care you need, when you need it.  Your next appointment:   6 month(s)  Provider:   Lonni LITTIE Nanas, MD    Thank you for choosing [facility]!!   [phone]   Other Instructions  Hold Eliquis  2 days prior to your upcoming colonoscopy

## 2024-10-29 ENCOUNTER — Ambulatory Visit: Payer: Self-pay | Admitting: Cardiology

## 2024-10-29 DIAGNOSIS — D508 Other iron deficiency anemias: Secondary | ICD-10-CM

## 2024-10-29 LAB — COMPREHENSIVE METABOLIC PANEL WITH GFR
ALT: 17 IU/L (ref 0–44)
AST: 29 IU/L (ref 0–40)
Albumin: 4.2 g/dL (ref 3.9–4.9)
Alkaline Phosphatase: 116 IU/L (ref 47–123)
BUN/Creatinine Ratio: 11 (ref 10–24)
BUN: 11 mg/dL (ref 8–27)
Bilirubin Total: 0.5 mg/dL (ref 0.0–1.2)
CO2: 22 mmol/L (ref 20–29)
Calcium: 9 mg/dL (ref 8.6–10.2)
Chloride: 103 mmol/L (ref 96–106)
Creatinine, Ser: 0.97 mg/dL (ref 0.76–1.27)
Globulin, Total: 3.1 g/dL (ref 1.5–4.5)
Glucose: 93 mg/dL (ref 70–99)
Potassium: 4.3 mmol/L (ref 3.5–5.2)
Sodium: 138 mmol/L (ref 134–144)
Total Protein: 7.3 g/dL (ref 6.0–8.5)
eGFR: 86 mL/min/1.73 (ref >59)

## 2024-10-29 LAB — CBC
Hematocrit: 34.1 % — ABNORMAL LOW (ref 37.5–51.0)
Hemoglobin: 9.5 g/dL — ABNORMAL LOW (ref 13.0–17.7)
MCH: 19.6 pg — ABNORMAL LOW (ref 26.6–33.0)
MCHC: 27.9 g/dL — ABNORMAL LOW (ref 31.5–35.7)
MCV: 70 fL — ABNORMAL LOW (ref 79–97)
Platelets: 291 x10E3/uL (ref 150–450)
RBC: 4.85 x10E6/uL (ref 4.14–5.80)
RDW: 15.2 % (ref 11.6–15.4)
WBC: 4.9 x10E3/uL (ref 3.4–10.8)

## 2024-10-29 LAB — IRON,TIBC AND FERRITIN PANEL
Ferritin: 7 ng/mL — ABNORMAL LOW (ref 30–400)
Iron Saturation: 4 % — CL (ref 15–55)
Iron: 18 ug/dL — ABNORMAL LOW (ref 38–169)
Total Iron Binding Capacity: 489 ug/dL — ABNORMAL HIGH (ref 250–450)
UIBC: 471 ug/dL — ABNORMAL HIGH (ref 111–343)
